# Patient Record
Sex: Male | Born: 1965 | Race: Black or African American | Hispanic: No | Marital: Single | Smoking: Never smoker
Health system: Southern US, Community
[De-identification: ages and names within clinical notes are randomized; demographics above are authoritative.]

## PROBLEM LIST (undated history)

## (undated) DIAGNOSIS — I639 Cerebral infarction, unspecified: Secondary | ICD-10-CM

## (undated) DIAGNOSIS — E785 Hyperlipidemia, unspecified: Secondary | ICD-10-CM

## (undated) DIAGNOSIS — G4730 Sleep apnea, unspecified: Secondary | ICD-10-CM

## (undated) DIAGNOSIS — I10 Essential (primary) hypertension: Secondary | ICD-10-CM

## (undated) DIAGNOSIS — T7840XA Allergy, unspecified, initial encounter: Secondary | ICD-10-CM

## (undated) HISTORY — DX: Allergy, unspecified, initial encounter: T78.40XA

## (undated) HISTORY — DX: Essential (primary) hypertension: I10

## (undated) HISTORY — DX: Cerebral infarction, unspecified: I63.9

## (undated) HISTORY — DX: Hyperlipidemia, unspecified: E78.5

## (undated) HISTORY — DX: Sleep apnea, unspecified: G47.30

---

## 2001-05-23 DIAGNOSIS — I10 Essential (primary) hypertension: Secondary | ICD-10-CM

## 2001-05-23 HISTORY — DX: Essential (primary) hypertension: I10

## 2006-06-22 ENCOUNTER — Ambulatory Visit: Payer: Self-pay | Admitting: Family Medicine

## 2008-12-06 IMAGING — CR DG LUMBAR SPINE AP/LAT/OBLIQUES W/ FLEX AND EXT
1 series · 5 of 5 positions shown · non-contrast
Comparison: none

REASON FOR EXAM: xray l-spine back pain
COMMENTS:

PROCEDURE:     DXR - DXR LUMBAR SPINE WITH OBLIQUES  - June 22, 2006 [DATE]
RESULT:     Diffuse mild degenerative change is noted of the lumbar spine.
There is no evidence of fracture or dislocation.

[Series 1: view not recorded · 0.17mm/px · 5 of 5 slices shown]
[im 1/5]
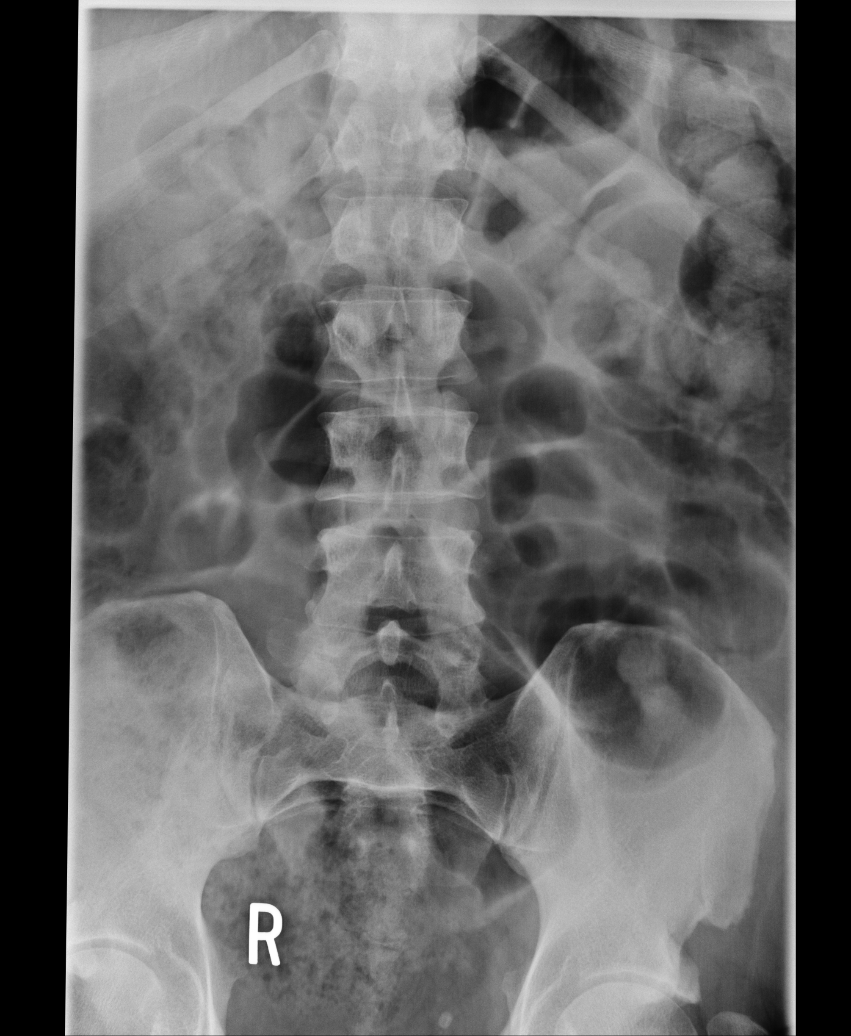
[im 2/5]
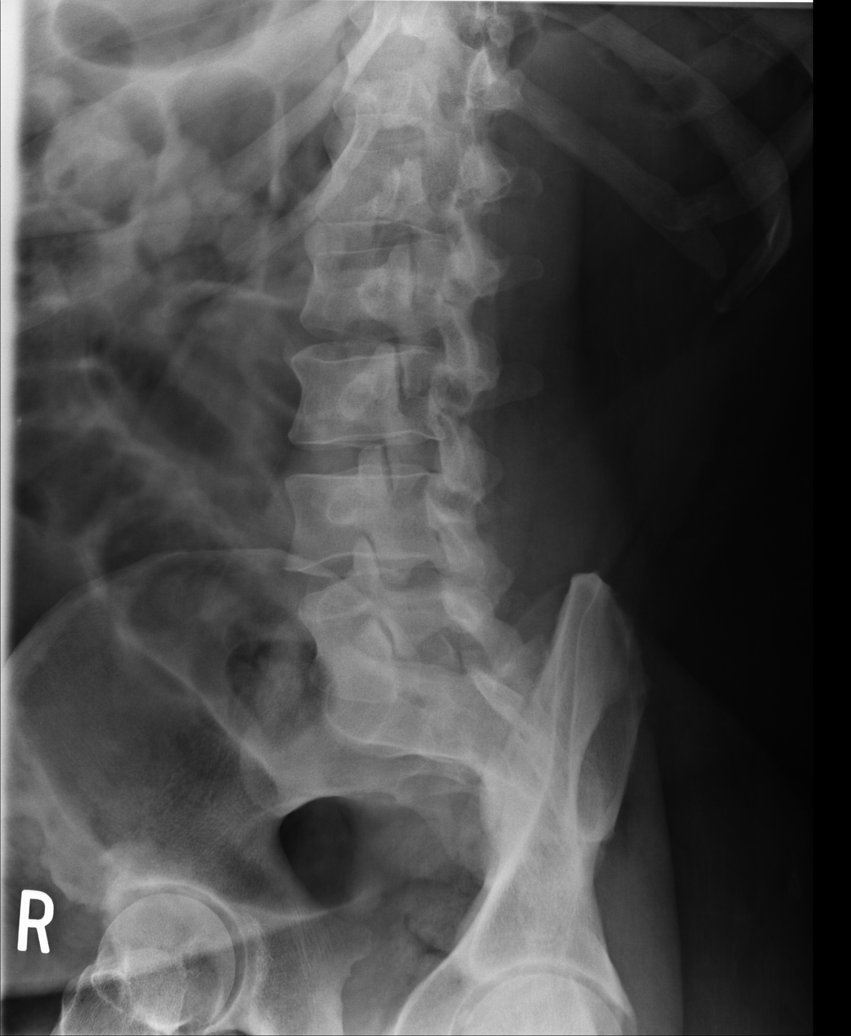
[im 3/5]
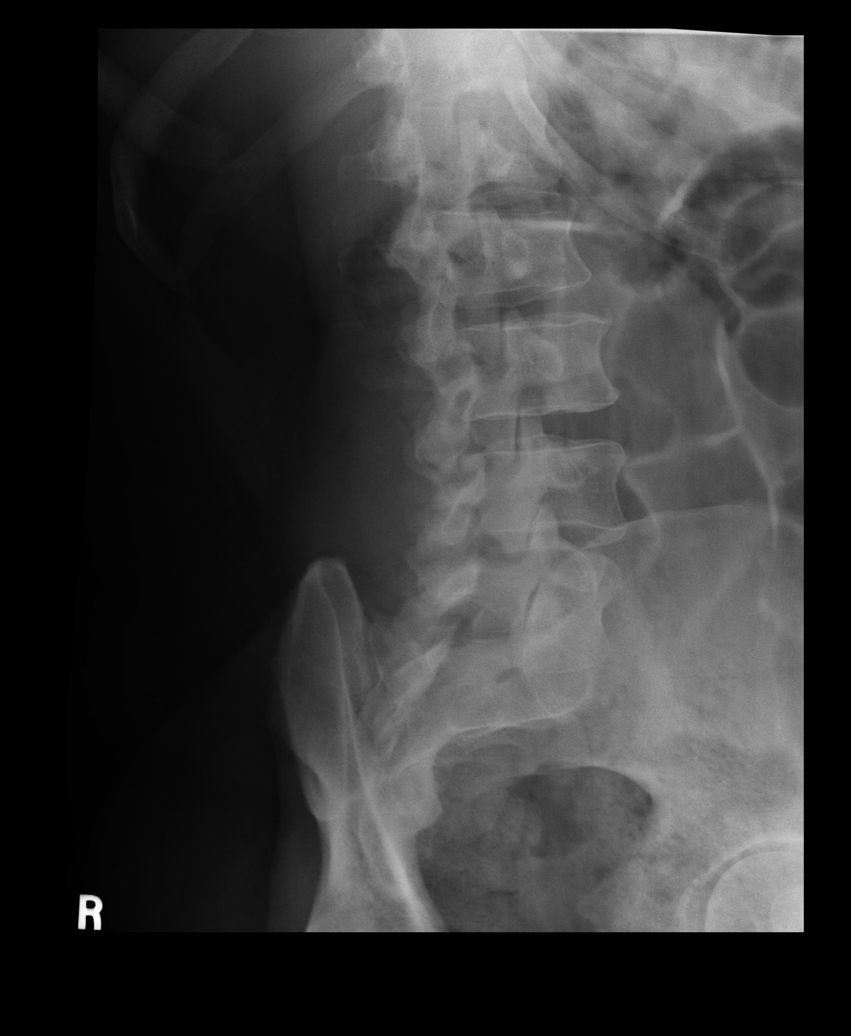
[im 4/5]
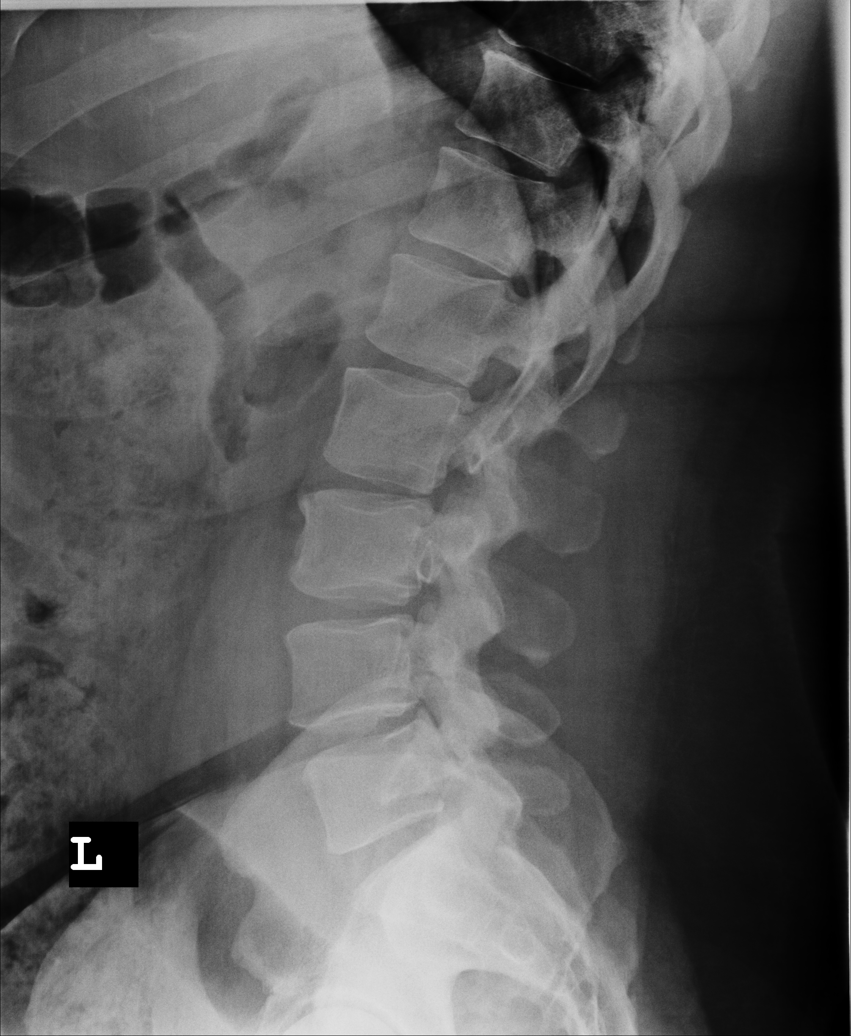
[im 5/5]
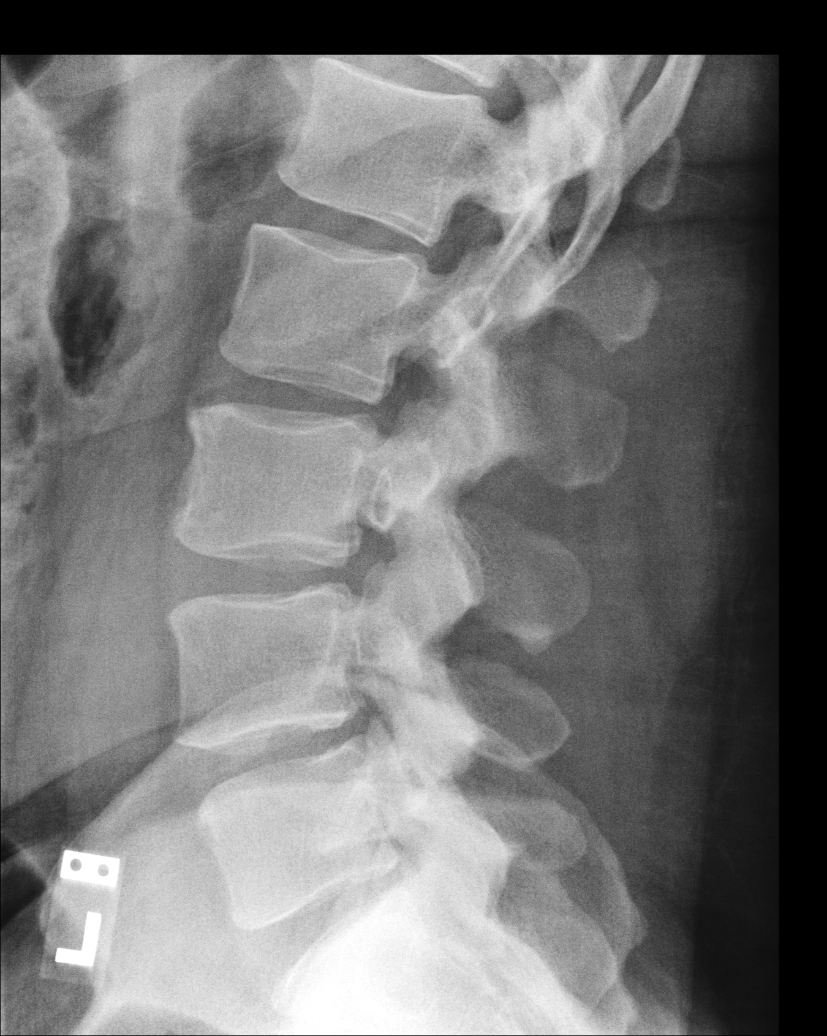

[5 of 5 positions shown; findings below may reference images not displayed]

IMPRESSION: 1)No acute or bony abnormalities are identified.

## 2015-12-22 DIAGNOSIS — I639 Cerebral infarction, unspecified: Secondary | ICD-10-CM

## 2015-12-22 HISTORY — DX: Cerebral infarction, unspecified: I63.9

## 2016-01-22 ENCOUNTER — Ambulatory Visit: Payer: BC Managed Care – PPO | Attending: Physical Medicine and Rehabilitation | Admitting: Occupational Therapy

## 2016-01-22 ENCOUNTER — Encounter: Payer: Self-pay | Admitting: Occupational Therapy

## 2016-01-22 DIAGNOSIS — R278 Other lack of coordination: Secondary | ICD-10-CM | POA: Diagnosis present

## 2016-01-22 DIAGNOSIS — M6281 Muscle weakness (generalized): Secondary | ICD-10-CM | POA: Insufficient documentation

## 2016-01-22 NOTE — Therapy (Signed)
[facility] [address] [place], [place], [address] Phone: [phone]   Fax:  [phone]  Occupational Therapy Treatment  Patient Details  Name: Dalton Mathis MRN: [id number] Date of Birth: January 31, 1966 Referring Provider: Dr. Merrily BrittleKimberly Karrat Rauch  Encounter Date: 01/22/2016      OT End of Session - 01/22/16 1321    Visit Number 1   Number of Visits 24   Date for OT Re-Evaluation 04/15/16   Authorization Type 1 of 10   OT Start Time 1000   OT Stop Time 1100   OT Time Calculation (min) 60 min   Activity Tolerance Patient tolerated treatment well;No increased pain   Behavior During Therapy WFL for tasks assessed/performed      Past Medical History:  Diagnosis Date  . Hypertension   . Stroke [facility](HCC)     History reviewed. No pertinent surgical history.  There were no vitals filed for this visit.      Subjective Assessment - 01/22/16 1255    Subjective  Pt. reports he just returned home from the hospital on Tuesday.   Patient is accompained by: Family member   Pertinent History Pt. is a 50 y.o. male who suffered from a CVA on 12/27/2015. [facility] Pt. returned home on 01/19/2016. Pt. is now appropriate for outpatient services to address residual LUE deficits from the CVA.   Limitations LUE functioning,   Patient Stated Goals To regain use of LUE in order to retrurn to work.   Currently in Pain? No/denies            [facility] - 01/22/16 1013      Assessment   Diagnosis CVA   Referring Provider Dr. Merrily BrittleKimberly Karrat Rauch   Onset Date 12/27/15   Prior Therapy OT, [facility]     Home  Environment   Family/patient expects to be discharged to: Private residence   Living Arrangements Parent   Available Help at Discharge Family   Type of Home House   Home Access Stairs   Entrance Stairs-Rails Can reach both   Entrance Stairs-Number of  Steps 4   Home Layout One level   Bathroom Shower/Tub Tub/Shower unit   Social workerBathroom Toilet Standard   Home Equipment Shower seat   Lives With Family     Prior Function   Level of Independence Independent   Vocation Full time employment   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   Grooming Difficulty holding the electric rayzor.   Upper Body Bathing Independent   Lower Body Bathing Independent  Sits down to perfrom LE bathing, previously stood to perform   Upper Body Dressing Independent   Lower Body Dressing Pt. Has difficulty hiking clothing with the left.   Nurse, learning disabilityToilet Tranfer Independent   Tub/Shower Transfer Independent   Equipment Used Cane   ADL comments Pt. has difficulty using his left hand to perform tasks requiring bilateral hand coordination. including: eating a sandwhich, taking items out of a wallet, typing, perfroming work retaled tasks, using an Systems developerelectric rayzor, and cutting meat.     IADL   Shopping Needs to be accompanied on any shopping trip   Light Housekeeping Does personal laundry completely;Maintains house alone or with occasional assistance   Meal Prep Able to complete simple cold meal  and snack prep   Community Mobility Relies on family or friends for transportation   Medication Management Is responsible for taking medication in correct dosages at correct time   Development worker, community financial matters independently (budgets, writes checks, pays rent, bills goes to bank), collects and keeps track of income     Mobility   Mobility Status Comments --  Carries a cane.     Written Expression   Dominant Hand Right   Handwriting 100% legible     Vision - History   Baseline Vision Wears glasses only for reading     Activity Tolerance   Activity Tolerance Tolerates 10-20 min activity with muiltiple rests     Cognition   Overall  Cognitive Status Within Functional Limits for tasks assessed     Sensation   Light Touch Impaired by gross assessment   Proprioception Appears Intact     Coordination   Right 9 Hole Peg Test 24 sec.   Left 9 Hole Peg Test 1 min.     AROM   Overall AROM Comments [facility]     Strength   Strength Assessment Site Shoulder   Right/Left Shoulder Left   Left Shoulder Flexion 4/5   Left Shoulder ABduction 4/5   Right/Left Elbow Left   Left Elbow Flexion 5/5   Left Elbow Extension 5/5   Right/Left Forearm Left   Left Forearm Pronation 4+/5   Left Forearm Supination 4+/5   Right/Left Wrist Left   Left Wrist Flexion 4+/5   Left Wrist Extension 4+/5   Left Wrist Radial Deviation 4+/5   Left Wrist Ulnar Deviation 4+/5     Hand Function   Right Hand Grip (lbs) 78   Right Hand Lateral Pinch 29 lbs   Right Hand 3 Point Pinch 15 lbs   Left Hand Grip (lbs) 52   Left Hand Lateral Pinch 24 lbs   Left 3 point pinch 16 lbs     Sensation Exercises   Stereognosis --  3/5 accuracy                          OT Education - 01/22/16 1321    Education provided Yes   Education Details OT services, POC, goals, LUE function, and stroke recovery.   Person(s) Educated Patient   Methods Explanation   Comprehension Verbalized understanding             OT Long Term Goals - 01/22/16 1334      OT LONG TERM GOAL #1   Title Pt. will increase left grip strength by 10# to be able to hold and use an electri rayzor.   Baseline Left: 52#, Right 78#   Time 12   Period Weeks   Status New     OT LONG TERM GOAL #2   Title Pt. will improve left hand coordination skills by 6 sec. to be able to remove items from his wallet efficiently.   Baseline Left: 1 min., R: 24 sec.   Time 12   Period Weeks   Status New     OT LONG TERM GOAL #3   Title Pt. will improve Left UE strength by 2 muscle grades in preparation to perform work related tasks.   Baseline LUE weakness   Time 12   Period  Weeks   Status New     OT LONG TERM GOAL #4   Title Pt. will accurately pick up and place iitems of varying weight 100%  of the time with the left hand in preparation for household tasks.   Baseline Pt. has difficulty   Time 12   Period Weeks   Status New     OT LONG TERM GOAL #5   Title Pt. will improve Left hand awareness to be able to hike clothing independently.   Baseline Pt. has difficulty.   Time 12   Period Weeks   Status New               Plan - 01/22/16 1323    Clinical Impression Statement Pt. is a 50 y.o. male who suffered a CVA, and presents with residual left sided weakness, incoordination, and sensory impairments. Pt. has difficulty accommodating the varying weights of items with his left hand. These impairments hinder his ability to complete basic ADL tasks,, IADL tasks, home management, and work related tasks.  Pt. Could benefit from skilled OT services to work on improving Left UE strength, grip strength, coordination, stereogniosis, and left sided sensory awareness, to be able to hold and use an electric rayzor, typing, retrieving iitems from a wallet, and perfroming work related tasks.   Rehab Potential Good   OT Frequency 2x / week   OT Duration 12 weeks   OT Treatment/Interventions Self-care/ADL training;Therapeutic exercise;Functional Mobility Training;Patient/family education;Neuromuscular education;Energy conservation;Therapeutic exercises;Therapeutic activities;DME and/or AE instruction;Moist Heat   Plan Plan to work on improving left hand function   Consulted and Agree with Plan of Care Patient      Patient will benefit from skilled therapeutic intervention in order to improve the following deficits and impairments:  Decreased strength, Decreased activity tolerance, Decreased coordination, Impaired UE functional use, Pain, Impaired perceived functional ability, Decreased mobility, Decreased knowledge of use of DME, Decreased endurance, Impaired  sensation  Visit Diagnosis: Muscle weakness (generalized)  Other lack of coordination    Problem List There are no active problems to display for this patient.   [name], MS, OTR/L 01/22/2016, 1:50 PM  [facility] MAIN [facility] SERVICES [address] [place], [place], [address] Phone: [phone]   Fax:  [phone]  Name: Jacobe Study MRN: [id number] Date of Birth: [birth year]

## 2016-01-28 ENCOUNTER — Ambulatory Visit: Payer: BC Managed Care – PPO | Admitting: Physical Therapy

## 2016-01-29 ENCOUNTER — Ambulatory Visit: Payer: BC Managed Care – PPO | Admitting: Occupational Therapy

## 2016-01-29 DIAGNOSIS — M6281 Muscle weakness (generalized): Secondary | ICD-10-CM | POA: Diagnosis not present

## 2016-01-29 DIAGNOSIS — R278 Other lack of coordination: Secondary | ICD-10-CM

## 2016-01-29 NOTE — Patient Instructions (Signed)
OT TREATMENT    Therapeutic Exercise:  Pt. Worked on the [employer] Corporation[employer] for 8 min. With constant monitoring of the BUEs. Pt. Worked on changing, and alternating forward reverse position every 2 min. Rest breaks were required. Pt. Required readjustment of his left hand on the handle. Pt. Worked on pinch strengthening in the left hand for lateral, and 3pt. pinch using yellow, red, green, blue, and black resistive clips. Pt. worked on placing the clips at various vertical and horizontal angles. Tactile and verbal cues were required for eliciting the desired movement. Pt. performed gross gripping with grip strengthener. Pt. worked on sustaining grip while grasping pegs and reaching at various heights. Gripper was placed in the 3rd resistive slot with the white resistive spring. Pt. Performed hand strengthening with green theraputty. Pt. required cues for proper technique. Pt. worked on gross grip loop, lateral pinch, 3pt. pinch, gross digit extension, digit extension table spread, digit abduction loop, single digit extension loop, thumb opposition, and lumbical ex,  Pt. required verbal and tactile cues for proper technique.  Selfcare:  Pt. Completed a a 5 min. Typing test. Pt. Completed a typing speed of 6  Wpm.

## 2016-01-29 NOTE — Therapy (Signed)
[place] [facility] [address] [place], [place], [address] Phone: [phone]   Fax:  [phone]  Occupational Therapy Treatment  Patient Details  Name: Dalton CarlLawrence Mathis MRN: [id number] Date of Birth: [birth year] Referring Provider: Dr. Merrily BrittleKimberly Karrat Rauch  Encounter Date: 01/29/2016      OT End of Session - 01/29/16 1239    Visit Number 2   Number of Visits 24   Date for OT Re-Evaluation 04/15/16   Authorization Type 2 of 10   OT Start Time 1100   OT Stop Time 1149   OT Time Calculation (min) 49 min   Activity Tolerance Patient tolerated treatment well   Behavior During Therapy [facility] for tasks assessed/performed      Past Medical History:  Diagnosis Date  . Hypertension   . Stroke [facility](HCC)     No past surgical history on file.  There were no vitals filed for this visit.      Subjective Assessment - 01/29/16 1202    Subjective  Pt. reports trying to go back to the gym.   Patient is accompained by: Family member   Pertinent History Pt. is a 50 y.o. male who suffered from a CVA on 12/27/2015. [facility]. Pt. returned home on 01/19/2016. Pt. is now appropriate for outpatient services to address residual LUE deficits from the CVA.   Patient Stated Goals To regain use of LUE in order to retrurn to work.   Currently in Pain? No/denies  Pt. has discomfort in left hip, not rated.   Multiple Pain Sites No      OT TREATMENT    Therapeutic Exercise:  Pt. Worked on the [employer] for 8 min. With constant monitoring of the BUEs. Pt. Worked on changing, and alternating forward reverse position every 2 min. Rest breaks were required. Pt. Required readjustment of his left hand on the handle. Pt. Worked on pinch strengthening in the left hand for lateral, and 3pt. pinch using yellow, red, green, blue, and black resistive clips. Pt. worked on placing the clips at various vertical and horizontal  angles. Tactile and verbal cues were required for eliciting the desired movement. Pt. performed gross gripping with grip strengthener. Pt. worked on sustaining grip while grasping pegs and reaching at various heights. Gripper was placed in the 3rd resistive slot with the white resistive spring. Pt. Performed hand strengthening with green theraputty. Pt. required cues for proper technique. Pt. worked on gross grip loop, lateral pinch, 3pt. pinch, gross digit extension, digit extension table spread, digit abduction loop, single digit extension loop, thumb opposition, and lumbical ex. Pt. required verbal and tactile cues for proper technique.  Selfcare:  Pt. Completed a a 5 min. Typing test. Pt. Completed a typing speed of 6  Wpm.                              OT Education - 01/29/16 1204    Education provided Yes   Education Details Pt. education about LUE functioning.   Person(s) Educated Patient   Methods Explanation   Comprehension Verbalized understanding             OT Long Term Goals - 01/22/16 1334      OT LONG TERM GOAL #1   Title Pt. will increase left grip strength by 10# to be able to hold and use an electri rayzor.   Baseline Left: 52#, Right  78#   Time 12   Period Weeks   Status New     OT LONG TERM GOAL #2   Title Pt. will improve left hand coordination skills by 6 sec. to be able to remove items from his wallet efficiently.   Baseline Left: 1 min., R: 24 sec.   Time 12   Period Weeks   Status New     OT LONG TERM GOAL #3   Title Pt. will improve Left UE strength by 2 muscle grades in preparation to perform work related tasks.   Baseline LUE weakness   Time 12   Period Weeks   Status New     OT LONG TERM GOAL #4   Title Pt. will accurately pick up and place iitems of varying weight 100% of the time with the left hand in preparation for household tasks.   Baseline Pt. has difficulty   Time 12   Period Weeks   Status New     OT LONG  TERM GOAL #5   Title Pt. will improve Left hand awareness to be able to hike clothing independently.   Baseline Pt. has difficulty.   Time 12   Period Weeks   Status New               Plan - 01/29/16 1239    Clinical Impression Statement Pt. continues to require work on improving LUE strength, grip, strength, pinch strength, and coordination skills. Pt. has difficulty accommodating the left hand to various weights of objects. Pt. could benefit from skilled OT services to improve UE functioning during ADL and IADL tasks including typing, lifting and holding objects.   Rehab Potential Good   OT Frequency 2x / week   OT Duration 12 weeks   OT Treatment/Interventions Self-care/ADL training;Therapeutic exercise;Functional Mobility Training;Patient/family education;Neuromuscular education;Energy conservation;Therapeutic exercises;Therapeutic activities;DME and/or AE instruction;Moist Heat   Consulted and Agree with Plan of Care Patient      Patient will benefit from skilled therapeutic intervention in order to improve the following deficits and impairments:  Decreased strength, Decreased activity tolerance, Decreased coordination, Impaired UE functional use, Pain, Impaired perceived functional ability, Decreased mobility, Decreased knowledge of use of DME, Decreased endurance, Impaired sensation  Visit Diagnosis: Muscle weakness (generalized)  Other lack of coordination    Problem List There are no active problems to display for this patient.   [name], MS, OTR/L 01/29/2016, 12:52 PM  [facility] MAIN [facility] SERVICES [address] [place], [place], [address] Phone: [phone]   Fax:  [phone]  Name: Dalton Mathis MRN: [id number] Date of Birth: 22-Oct-1965

## 2016-02-01 ENCOUNTER — Encounter: Payer: Self-pay | Admitting: Physical Therapy

## 2016-02-01 ENCOUNTER — Ambulatory Visit: Payer: BC Managed Care – PPO | Admitting: Physical Therapy

## 2016-02-01 ENCOUNTER — Ambulatory Visit: Payer: BC Managed Care – PPO | Admitting: Occupational Therapy

## 2016-02-01 DIAGNOSIS — M6281 Muscle weakness (generalized): Secondary | ICD-10-CM | POA: Diagnosis not present

## 2016-02-01 DIAGNOSIS — R278 Other lack of coordination: Secondary | ICD-10-CM

## 2016-02-01 NOTE — Therapy (Signed)
[place] [facility] MAIN [facility] SERVICES [address] [place], [place], [address] Phone: [phone]   Fax:  [phone]  Physical Therapy Evaluation  Patient Details  Name: Dalton Mathis MRN: [id number] Date of Birth: 01-06-1966 Referring Provider: Merrily Brittle  Encounter Date: 02/01/2016      PT End of Session - 02/01/16 1358    Visit Number 1   Number of Visits 25   Date for PT Re-Evaluation 04/25/16   PT Start Time 0145   PT Stop Time 0245   PT Time Calculation (min) 60 min   Equipment Utilized During Treatment Gait belt   Activity Tolerance Patient tolerated treatment well   Behavior During Therapy [facility] for tasks assessed/performed      Past Medical History:  Diagnosis Date  . Hypertension   . Stroke [facility])     History reviewed. No pertinent surgical history.  There were no vitals filed for this visit.       Subjective Assessment - 02/01/16 1350    Subjective Patient report having decreased strength in his LUE and numbness in LLE.    How long can you stand comfortably? 1/2 hour same as before the cva   How long can you walk comfortably? 30 mintues   Patient Stated Goals for his left leg to not be numb   Currently in Pain? No/denies            [facility] PT Assessment - 02/01/16 0001      Assessment   Medical Diagnosis cva   Referring Provider [place], [place] KARRAT   Onset Date/Surgical Date 12/27/15   Hand Dominance Right   Next MD Visit 02/23/16   Prior Therapy hospital      Precautions   Precautions Fall     Restrictions   Weight Bearing Restrictions No     Balance Screen   Has the patient fallen in the past 6 months Yes   Has the patient had a decrease in activity level because of a fear of falling?  Yes   Is the patient reluctant to leave their home because of a fear of falling?  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   Leisure Reading, writing, sports, wrestle     Cognition   Overall Cognitive Status Within Functional Limits for tasks assessed   Attention Focused      PAIN: 0/10  POSTURE: WNL   PROM/AROM: WFL  STRENGTH:  Graded on a 0-5 scale Muscle Group Left Right  Shoulder flex 5/5 5/5  Shoulder Abd 5/5 5/5  Shoulder Ext 5/5 5/5  Shoulder IR/ER 5/5 5/5  Elbow 4/5 5/5  Wrist/hand 5/5 5/5  Hip Flex 3/5 5/5  Hip Abd 4/5 5/5  Hip Add 2/5 4/5  Hip Ext 4/5 4/5  Hip IR/ER 5/5 5/5  Knee Flex 5/5 5/5  Knee Ext 5/5 5/5  Ankle DF 3/5 5/5  Ankle PF 3/5 5/5   SENSATION: LLE numbness    FUNCTIONAL MOBILITY: independent   BALANCE: unable to single leg stand, modified tandem stand  x 30 sec bilaterally   GAIT: ambulates with spc  OUTCOME MEASURES: TEST Outcome Interpretation  5 times sit<>stand 14.78 sec >60 yo, >15 sec indicates increased risk for falls  10 meter walk test             1.24 m/s <1.0 m/s indicates increased risk for falls; limited community ambulator  Timed up and Go   13.50              sec <14 sec indicates increased risk for falls  6 minute walk test 1200               Feet 1000 feet is community ambulator                                  PT Education - 02/01/16 1357    Education provided Yes   Education Details plan of care   Person(s) Educated Patient   Methods Explanation   Comprehension Verbalized understanding             PT Long Term Goals - 02/01/16 1412      PT LONG TERM GOAL #1   Title Patient will increase BLE gross strength to 4+/5 as to improve functional strength for independent gait, increased standing tolerance and  increased ADL ability.   Time 4   Period Weeks   Status New     PT LONG TERM GOAL #2   Title Patient will reduce timed up and go to <11 seconds to reduce fall risk and demonstrate improved transfer/gait ability.   Time 4   Period Weeks   Status New     PT LONG TERM GOAL #3   Title Patient will be independent with ascend/descend 12 steps using single UE in step over step pattern without LOB.   Time 4   Period Weeks   Status New     PT LONG TERM GOAL #4   Title Patient will increase six minute walk test distance to >1200 for progression to community ambulator and improve gait ability   Time 4   Period Weeks   Status New               Plan - 02/01/16 1416    Clinical Impression Statement Patient is 50 yr old male who presents to out patient clinic after a cva. He has weakness in LLE and LUE and numbness in LLE. He has increased falls risk evidenced wiht outcome measures. He has decreased dynamic standing balance . He ambulates with spc  community distances.    Rehab Potential Good   PT Frequency 2x / week   PT Duration 12 weeks   PT Treatment/Interventions Therapeutic exercise;Balance training;Neuromuscular re-education;Therapeutic activities;Gait training;Manual techniques   PT Next Visit Plan strengthening and balance    PT Home Exercise Plan heel raises, marching in place   Consulted and Agree with Plan of Care Patient      Patient will benefit from skilled therapeutic intervention in order to improve the following deficits and impairments:  Decreased balance, Impaired sensation, Obesity, Decreased activity tolerance, Decreased coordination, Decreased strength  Visit Diagnosis: Muscle weakness (generalized) - Plan: PT plan of care cert/re-cert  Other lack of coordination - Plan: PT plan of care cert/re-cert     Problem List There are no active problems to display for this patient.  Ezekiel InaKristine S Madysun Thall, PT, DPT [place], Barkley BrunsKristine S 02/01/2016, 3:45  PM  [facility] MAIN [facility] SERVICES [address] [place], [place], [address] Phone: [phone]   Fax:  [phone]  Name: Dalton Mathis MRN: [id number] Date of Birth: [birth year]

## 2016-02-01 NOTE — Patient Instructions (Signed)
Heel raises 20 x 2 BTB x ankle eversion x 20 x 2 BTB x ankle DF x 20 x 2

## 2016-02-01 NOTE — Therapy (Addendum)
[facility] MAIN [facility] SERVICES [address] [place], [place], [address] Phone: [phone]   Fax:  [phone]  Occupational Therapy Treatment  Patient Details  Name: Dalton Mathis MRN: [id number] Date of Birth: Nov 05, 1965 Referring Provider: Dr. Merrily Brittle  Encounter Date: 02/01/2016      OT End of Session - 02/01/16 1157    Visit Number 3   Number of Visits 24   Date for OT Re-Evaluation 04/15/16   Authorization Type 3 of 10   OT Start Time 1110   OT Stop Time 1150   OT Time Calculation (min) 40 min   Activity Tolerance Patient tolerated treatment well   Behavior During Therapy [facility] for tasks assessed/performed      Past Medical History:  Diagnosis Date  . Hypertension   . Stroke [facility])     No past surgical history on file.  There were no vitals filed for this visit.      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   Patient is accompained by: Family member   Pertinent History Pt. is a 50 y.o. male who suffered from a CVA on 12/27/2015. Pt. received OT/PT services at [facility] inpatient rehab. Pt. returned home on 01/19/2016. Pt. is now appropriate for outpatient services to address residual LUE deficits from the CVA.   Limitations LUE functioning,   Patient Stated Goals To regain use of LUE in order to retrurn to work.   Currently in Pain? No/denies   Multiple Pain Sites No      OT TREATMENT    Neuro muscular re-education:  Pt. Worked on the development of fine motor coordination activities to improve LUE accommodation to objects of varying weights. Pt. Has difficulty with over grasping light objects. Pt. performed [facility] skills training to improve speed and dexterity needed for ADL tasks and writing. Pt. demonstrated grasping 1 inch sticks, and  inch cylindrical collars, on the Purdue pegboard. Pt. performed grasping each item with her 2nd digit and thumb,  and storing them in the palm. Pt. presented with difficulty storing  inch objects at a time in the palmar aspect of the hand. Pt. Worked on alternating hand movements, and bilateral hand coordination skills.  Therapeutic Exercise:   Pt. Worked on the [employer] for 8 min. With constant monitoring of the BUEs. Pt. Worked on changing, and alternating forward reverse position every 2 min. Rest breaks were required. Pt. Was assisted with hand placement 2 times during session.                             OT Education - 02/01/16 1156    Education provided Yes   Education Details Pt. education was provided about UE functioning.   Person(s) Educated Patient   Methods Explanation   Comprehension Verbalized understanding             OT Long Term Goals - 01/22/16 1334      OT LONG TERM GOAL #1   Title Pt. will increase left grip strength by 10# to be able to hold and use an electri rayzor.   Baseline Left: 52#, Right 78#   Time 12   Period Weeks   Status New     OT LONG TERM GOAL #2   Title Pt. will improve left hand coordination skills by 6 sec. to be able to remove items from his  wallet efficiently.   Baseline Left: 1 min., R: 24 sec.   Time 12   Period Weeks   Status New     OT LONG TERM GOAL #3   Title Pt. will improve Left UE strength by 2 muscle grades in preparation to perform work related tasks.   Baseline LUE weakness   Time 12   Period Weeks   Status New     OT LONG TERM GOAL #4   Title Pt. will accurately pick up and place iitems of varying weight 100% of the time with the left hand in preparation for household tasks.   Baseline Pt. has difficulty   Time 12   Period Weeks   Status New     OT LONG TERM GOAL #5   Title Pt. will improve Left hand awareness to be able to hike clothing independently.   Baseline Pt. has difficulty.   Time 12   Period Weeks   Status New               Plan - 02/01/16 1158    Clinical Impression Statement  Pt. is improving with UE strength. Pt. continues to require work on improving [facility] skills for grasping various sized objects. Pt. continues to have difficulty accommodating the LUE to changes in the weight objects.    Rehab Potential Good   OT Frequency 2x / week   OT Duration 12 weeks   OT Treatment/Interventions Self-care/ADL training;Therapeutic exercise;Functional Mobility Training;Patient/family education;Neuromuscular education;Energy conservation;Therapeutic exercises;Therapeutic activities;DME and/or AE instruction;Moist Heat   Consulted and Agree with Plan of Care Patient      Patient will benefit from skilled therapeutic intervention in order to improve the following deficits and impairments:  Decreased strength, Decreased activity tolerance, Decreased coordination, Impaired UE functional use, Pain, Impaired perceived functional ability, Decreased mobility, Decreased knowledge of use of DME, Decreased endurance, Impaired sensation  Visit Diagnosis: Muscle weakness (generalized)  Other lack of coordination    Problem List There are no active problems to display for this patient.   Olegario MessierElaine Rhoderick Farrel, MS, OTR/L 02/01/2016, 12:10 PM  [place] [address] [place], [place], [address] Phone: [phone]   Fax:  [phone]  Name: Malachi CarlLawrence Mattos MRN: [id number] Date of Birth: [birth year]

## 2016-02-01 NOTE — Patient Instructions (Signed)
OT TREATMENT    Neuro muscular re-education:  Pt. Worked on the development of fine motor coordination activities to improve LUE accommodation to objects of varying weights. Pt. Has difficulty with over grasping light objects. Pt. performed [facility] skills training to improve speed and dexterity needed for ADL tasks and writing. Pt. demonstrated grasping 1 inch sticks, and  inch cylindrical collars, on the Purdue pegboard. Pt. performed grasping each item with her 2nd digit and thumb, and storing them in the palm. Pt. presented with difficulty storing  inch objects at a time in the palmar aspect of the hand. Pt. Worked on alternating hand movements, and bilateral hand coordination skills.  Therapeutic Exercise:   Pt. Worked on the [employer] for 8 min. With constant monitoring of the BUEs. Pt. Worked on changing, and alternating forward reverse position every 2 min. Rest breaks were required. Pt. Was assisted with hand placement 2 times during session.

## 2016-02-03 ENCOUNTER — Ambulatory Visit: Payer: BC Managed Care – PPO | Admitting: Physical Therapy

## 2016-02-05 ENCOUNTER — Ambulatory Visit: Payer: BC Managed Care – PPO | Admitting: Occupational Therapy

## 2016-02-05 DIAGNOSIS — R278 Other lack of coordination: Secondary | ICD-10-CM

## 2016-02-05 DIAGNOSIS — M6281 Muscle weakness (generalized): Secondary | ICD-10-CM

## 2016-02-05 NOTE — Therapy (Signed)
[facility] [address] [place], [place], [address] Phone: [phone]   Fax:  [phone]  Occupational Therapy Treatment  Patient Details  Name: Dalton Mathis MRN: [id number] Date of Birth: October 31, 1965 Referring Provider: Dr. Merrily BrittleKimberly Karrat Rauch  Encounter Date: 02/05/2016      OT End of Session - 02/05/16 1310    Visit Number 4   Number of Visits 24   Date for OT Re-Evaluation 04/15/16   Authorization Type 4 of 10   OT Start Time 1000   OT Stop Time 1045   OT Time Calculation (min) 45 min   Activity Tolerance Patient tolerated treatment well   Behavior During Therapy [facility] for tasks assessed/performed      Past Medical History:  Diagnosis Date  . Hypertension   . Stroke [facility])     No past surgical history on file.  There were no vitals filed for this visit.      Subjective Assessment - 02/05/16 1309    Subjective  Pt. reports going to the gym more. Pt. is now walking without the cane.   Patient is accompained by: Family member   Pertinent History Pt. is a 50 y.o. male who suffered from a CVA on 12/27/2015. [facility]. Pt. returned home on 01/19/2016. Pt. is now appropriate for outpatient services to address residual LUE deficits from the CVA.   Limitations LUE functioning,   Patient Stated Goals To regain use of LUE in order to retrurn to work.   Currently in Pain? No/denies      OT TREATMENT    Neuro muscular re-education:  Pt. performed [facility] tasks using the Grooved pegboard. Pt. worked on grasping the grooved pegs from a horizontal position, and moving the pegs to a vertical position in the hand to prepare for placing them in the grooved slot. Pt. Worked on storing the pegs. Pt. Worked on grasping coins from a tabletop surface, placing them into a resistive container, and pushing them through the slot while isolating his 2nd digit. Pt. Worked on grasping them  without looking to identify the quarters by feel. Pt. Also worked on [facility] skills with grasping, storing items in the palm, and translatory movements. Pt. Worked on identifying, and matching multiple textures of cloth with vision occluded. Pt. Had difficulty distinguishing similar textures.  Therapeutic Exercise:  Pt. Worked on the [employer] for 8 min. With constant monitoring of the BUEs. Pt. Worked on changing, and alternating forward reverse position every 2 min. Rest breaks were required. Cues for Left hand placement were required.                           OT Education - 02/05/16 1310    Education provided Yes   Person(s) Educated Patient   Methods Explanation   Comprehension Verbalized understanding             OT Long Term Goals - 01/22/16 1334      OT LONG TERM GOAL #1   Title Pt. will increase left grip strength by 10# to be able to hold and use an electri rayzor.   Baseline Left: 52#, Right 78#   Time 12   Period Weeks   Status New     OT LONG TERM GOAL #2   Title Pt. will improve left hand coordination skills by 6 sec. to be able to remove items from his wallet  efficiently.   Baseline Left: 1 min., R: 24 sec.   Time 12   Period Weeks   Status New     OT LONG TERM GOAL #3   Title Pt. will improve Left UE strength by 2 muscle grades in preparation to perform work related tasks.   Baseline LUE weakness   Time 12   Period Weeks   Status New     OT LONG TERM GOAL #4   Title Pt. will accurately pick up and place iitems of varying weight 100% of the time with the left hand in preparation for household tasks.   Baseline Pt. has difficulty   Time 12   Period Weeks   Status New     OT LONG TERM GOAL #5   Title Pt. will improve Left hand awareness to be able to hike clothing independently.   Baseline Pt. has difficulty.   Time 12   Period Weeks   Status New               Plan - 02/05/16 1311    Clinical Impression  Statement Pt. is making progress overall with LUE functioning. Pt. continues to require work on improving strength, coordination skills for use during ADL and IADL tasks. Pt. continues to have difficulty distinguishing between various textures of cloth, and similair objects without looking. Pt. had difficulty distinguishing quarters and nickels, however did well with distinquishing quarters from pennies, and dimes.   Rehab Potential Good   OT Frequency 2x / week   OT Duration 12 weeks   OT Treatment/Interventions Self-care/ADL training;Therapeutic exercise;Functional Mobility Training;Patient/family education;Neuromuscular education;Energy conservation;Therapeutic exercises;Therapeutic activities;DME and/or AE instruction;Moist Heat   Consulted and Agree with Plan of Care Patient      Patient will benefit from skilled therapeutic intervention in order to improve the following deficits and impairments:     Visit Diagnosis: Muscle weakness (generalized)  Other lack of coordination    Problem List There are no active problems to display for this patient.   [name], MS, OTR/L 02/05/2016, 1:19 PM  [facility] MAIN [facility] SERVICES [address] [place], [place], [address] Phone: [phone]   Fax:  [phone]  Name: Dalton Mathis MRN: [id number] Date of Birth: [birth year]

## 2016-02-05 NOTE — Patient Instructions (Signed)
OT TREATMENT    Neuro muscular re-education:  Pt. performed [facility] tasks using the Grooved pegboard. Pt. worked on grasping the grooved pegs from a horizontal position, and moving the pegs to a vertical position in the hand to prepare for placing them in the grooved slot. Pt. Worked on storing the pegs. Pt. Worked on grasping coins from a tabletop surface, placing them into a resistive container, and pushing them through the slot while isolating his 2nd digit. Pt. Worked on grasping them without looking to identify the quarters by feel. Pt. Worked on identifying, and matching multiple textures of cloth with vision occluded.   Therapeutic Exercise:  Pt. Worked on the [employer] for 8 min. With constant monitoring of the BUEs. Pt. Worked on changing, and alternating forward reverse position every 2 min. Rest breaks were required. Cues for Left hand placement were required.

## 2016-02-08 ENCOUNTER — Encounter: Payer: Self-pay | Admitting: Physical Therapy

## 2016-02-08 ENCOUNTER — Ambulatory Visit: Payer: BC Managed Care – PPO | Admitting: Occupational Therapy

## 2016-02-08 ENCOUNTER — Ambulatory Visit: Payer: BC Managed Care – PPO | Admitting: Physical Therapy

## 2016-02-08 DIAGNOSIS — M6281 Muscle weakness (generalized): Secondary | ICD-10-CM | POA: Diagnosis not present

## 2016-02-08 DIAGNOSIS — R278 Other lack of coordination: Secondary | ICD-10-CM

## 2016-02-08 NOTE — Therapy (Signed)
[facility] MAIN [facility] SERVICES [address] [place], [place], [address] Phone: [phone]   Fax:  [phone]  Physical Therapy Treatment  Patient Details  Name: Nicolai Labonte MRN: [id number] Date of Birth: 1966/04/17 Referring Provider: Merrily Brittle  Encounter Date: 02/08/2016      PT End of Session - 02/08/16 1432    Visit Number 2   Number of Visits 25   Date for PT Re-Evaluation 04/25/16   PT Start Time 0230   PT Stop Time 0315   PT Time Calculation (min) 45 min   Equipment Utilized During Treatment Gait belt   Activity Tolerance Patient tolerated treatment well   Behavior During Therapy [facility] for tasks assessed/performed      Past Medical History:  Diagnosis Date  . Hypertension   . Stroke [facility])     History reviewed. No pertinent surgical history.  There were no vitals filed for this visit.      Subjective Assessment - 02/08/16 1431    Subjective Patient report having sorness in left hip.    How long can you stand comfortably? 1/2 hour same as before the cva   How long can you walk comfortably? 30 mintues   Patient Stated Goals for his left leg to not be numb   Currently in Pain? No/denies   Multiple Pain Sites No     Therapeutic exercise and NMR:  Tm walking with elevation of 2 and 1. 5 miles / hour Tm side stepping at .5 miles / hour left and right  Side stepping in parallel bars on blue foam 10 x 2 Leg press 130 lbs x 20 x 3, heel raises 20 x 3 Wall squats with hold of 10 x 10 Cross trainer octaine x 10 mins level 10 Patient needs occasional verbal cueing to improve posture and cueing to correctly perform exercises slowly, holding at end of range to increase motor firing of desired muscle to encourage fatigue.                            PT Education - 02/08/16 1431    Education provided Yes   Education Details HEP    Person(s) Educated Patient   Methods Explanation    Comprehension Verbalized understanding             PT Long Term Goals - 02/01/16 1412      PT LONG TERM GOAL #1   Title Patient will increase BLE gross strength to 4+/5 as to improve functional strength for independent gait, increased standing tolerance and increased ADL ability.   Time 4   Period Weeks   Status New     PT LONG TERM GOAL #2   Title Patient will reduce timed up and go to <11 seconds to reduce fall risk and demonstrate improved transfer/gait ability.   Time 4   Period Weeks   Status New     PT LONG TERM GOAL #3   Title Patient will be independent with ascend/descend 12 steps using single UE in step over step pattern without LOB.   Time 4   Period Weeks   Status New     PT LONG TERM GOAL #4   Title Patient will increase six minute walk test distance to >1200 for progression to community ambulator and improve gait ability   Time 4   Period Weeks   Status New  Plan - 02/08/16 1432    Clinical Impression Statement Patient has deceased strength and decreased  dynamic standing balance and was able to perform activities to challenge his dynamic standing balnace. Cueing was needed during exercises for posture correction and intensity.    Rehab Potential Good   PT Frequency 2x / week   PT Duration 12 weeks   PT Treatment/Interventions Therapeutic exercise;Balance training;Neuromuscular re-education;Therapeutic activities;Gait training;Manual techniques   PT Next Visit Plan strengthening and balance    PT Home Exercise Plan heel raises, marching in place   Consulted and Agree with Plan of Care Patient      Patient will benefit from skilled therapeutic intervention in order to improve the following deficits and impairments:  Decreased balance, Impaired sensation, Obesity, Decreased activity tolerance, Decreased coordination, Decreased strength  Visit Diagnosis: Muscle weakness (generalized)  Other lack of coordination     Problem  List There are no active problems to display for this patient.  Ezekiel InaKristine S Nikiyah Fackler, PT, DPT [place], [place] 02/08/2016, 2:35 PM  [place] [facility] [address] [place], [place], [address] Phone: [phone]   Fax:  [phone]  Name: Malachi CarlLawrence Huitron MRN: [id number] Date of Birth: 1965/10/10

## 2016-02-08 NOTE — Patient Instructions (Signed)
OT TREATMENT    Neuro muscular re-education:  Pt.  Focused on improving [facility] with manipulating nuts and bolts positioned vertically. Pt. Was able to unscrew both the 1" and 1/2" nuts, however was unable to remove the nuts without dropping them. Pt. Worked on picking up the nuts from the table and placing them back onto the bolt. Pt. Had more difficulty grasping and placing the nuts into position and alignment. Pt. required increased time and cues for hand control during the task.  Therapeutic Exercise:  Pt. Worked on the [employer] for 8 min. With constant monitoring of the BUEs. Pt. Worked on changing, and alternating forward reverse position every 2 min. Rest breaks were required. Pt. performed gross gripping with grip strengthener. Pt. worked on sustaining grip while grasping pegs and reaching at various heights. Gripper was placed in the resistive slot with the white resistive spring. Pt used a 2# cuff weight. Pt. Worked on pinch strengthening in the left hand for lateral, and 3pt. pinch using yellow, red, green, blue, and black resistive clips. Pt. worked on placing the clips at various vertical and horizontal angles. Tactile and verbal cues were required for eliciting the desired movement.

## 2016-02-08 NOTE — Therapy (Signed)
[facility] MAIN [facility] SERVICES [address] [place], [place], [address] Phone: [phone]   Fax:  [phone]  Occupational Therapy Treatment  Patient Details  Name: Dalton Mathis MRN: [id number] Date of Birth: 07/23/1965 Referring Provider: Dr. Merrily Brittle  Encounter Date: 02/08/2016      OT End of Session - 02/08/16 1641    Visit Number 5   Number of Visits 24   Date for OT Re-Evaluation 04/15/16   Authorization Type 5 of 10   OT Start Time 1347   OT Stop Time 1430   OT Time Calculation (min) 43 min   Activity Tolerance Patient tolerated treatment well   Behavior During Therapy [facility] for tasks assessed/performed      Past Medical History:  Diagnosis Date  . Hypertension   . Stroke [facility]     No past surgical history on file.  There were no vitals filed for this visit.      Subjective Assessment - 02/08/16 1639    Subjective  Pt. reports he feel as thought the numbness in his left hand is improving.   Patient is accompained by: Family member   Pertinent History Pt. is a 51 y.o. male who suffered from a CVA on 12/27/2015. Pt. received OT/PT services at [facility] inpatient rehab. Pt. returned home on 01/19/2016. Pt. is now appropriate for outpatient services to address residual LUE deficits from the CVA.   Limitations LUE functioning   Currently in Pain? No/denies   Multiple Pain Sites No      OT TREATMENT    Neuro muscular re-education:  Pt.  Focused on improving [facility] with manipulating nuts and bolts positioned vertically. Pt. Was able to unscrew both the 1" and 1/2" nuts, however was unable to remove the nuts without dropping them. Pt. Worked on picking up the nuts from the table and placing them back onto the bolt. Pt. Had more difficulty grasping and placing the nuts into position and alignment. Pt. required increased time and cues for hand control during the task.  Therapeutic Exercise:  Pt. Worked on the [employer] for  8 min. With constant monitoring of the BUEs. Pt. Worked on changing, and alternating forward reverse position every 2 min. Rest breaks were required. Pt. performed gross gripping with grip strengthener. Pt. worked on sustaining grip while grasping pegs and reaching at various heights. Gripper was placed in the resistive slot with the white resistive spring. Pt used a 2# cuff weight. Pt. Worked on pinch strengthening in the left hand for lateral, and 3pt. pinch using yellow, red, green, blue, and black resistive clips. Pt. worked on placing the clips at various vertical and horizontal angles. Tactile and verbal cues were required for eliciting the desired movement.                          OT Education - 02/08/16 1640    Education provided Yes   Education Details UE strength, and coordination   Person(s) Educated Patient   Methods Explanation   Comprehension Verbalized understanding             OT Long Term Goals - 01/22/16 1334      OT LONG TERM GOAL #1   Title Pt. will increase left grip strength by 10# to be able to hold and use an electri rayzor.   Baseline Left: 52#, Right 78#   Time 12   Period Weeks   Status New  OT LONG TERM GOAL #2   Title Pt. will improve left hand coordination skills by 6 sec. to be able to remove items from his wallet efficiently.   Baseline Left: 1 min., R: 24 sec.   Time 12   Period Weeks   Status New     OT LONG TERM GOAL #3   Title Pt. will improve Left UE strength by 2 muscle grades in preparation to perform work related tasks.   Baseline LUE weakness   Time 12   Period Weeks   Status New     OT LONG TERM GOAL #4   Title Pt. will accurately pick up and place iitems of varying weight 100% of the time with the left hand in preparation for household tasks.   Baseline Pt. has difficulty   Time 12   Period Weeks   Status New     OT LONG TERM GOAL #5   Title Pt. will improve Left hand awareness to be able to hike  clothing independently.   Baseline Pt. has difficulty.   Time 12   Period Weeks   Status New               Plan - 02/08/16 1642    Clinical Impression Statement Pt. has made progress overall with LUE strength, coordination skills, and LUE awareness. Pt. continues to benefit from skilled OT services to work on improving proximal shoulder strength for sustained elevation while using hand during ADLs for ADL and IADL functioning.   Rehab Potential Good   OT Frequency 2x / week   OT Duration 12 weeks   OT Treatment/Interventions Self-care/ADL training;Therapeutic exercise;Functional Mobility Training;Patient/family education;Neuromuscular education;Energy conservation;Therapeutic exercises;Therapeutic activities;DME and/or AE instruction;Moist Heat   Consulted and Agree with Plan of Care Patient      Patient will benefit from skilled therapeutic intervention in order to improve the following deficits and impairments:  Decreased strength, Decreased activity tolerance, Decreased coordination, Impaired UE functional use, Pain, Impaired perceived functional ability, Decreased mobility, Decreased knowledge of use of DME, Decreased endurance, Impaired sensation  Visit Diagnosis: Muscle weakness (generalized)    Problem List There are no active problems to display for this patient.   Olegario MessierElaine Natanel Snavely, MS, OTR/L 02/08/2016, 4:54 PM  [place] [facility] MAIN [facility] [address] [place], [place], [address] Phone: [phone]   Fax:  [phone]  Name: Dalton Mathis MRN: [id number] Date of Birth: 07/17/1965

## 2016-02-10 ENCOUNTER — Ambulatory Visit: Payer: BC Managed Care – PPO | Admitting: Physical Therapy

## 2016-02-10 ENCOUNTER — Encounter: Payer: Self-pay | Admitting: Physical Therapy

## 2016-02-10 ENCOUNTER — Ambulatory Visit: Payer: BC Managed Care – PPO | Admitting: Occupational Therapy

## 2016-02-10 DIAGNOSIS — M6281 Muscle weakness (generalized): Secondary | ICD-10-CM

## 2016-02-10 DIAGNOSIS — R278 Other lack of coordination: Secondary | ICD-10-CM

## 2016-02-10 NOTE — Therapy (Signed)
[facility] MAIN [facility] SERVICES [address] [place], [place], [address] Phone: [phone]   Fax:  [phone]  Occupational Therapy Treatment  Patient Details  Name: Javaun Dimperio MRN: [id number] Date of Birth: 11-21-1965 Referring Provider: Dr. Merrily Brittle  Encounter Date: 02/10/2016      OT End of Session - 02/10/16 1611    Visit Number 6   Number of Visits 24   Date for OT Re-Evaluation 04/15/16   Authorization Type 6 of 10   OT Start Time 1350   OT Stop Time 1430   OT Time Calculation (min) 40 min   Activity Tolerance Patient tolerated treatment well   Behavior During Therapy [facility] for tasks assessed/performed      Past Medical History:  Diagnosis Date  . Hypertension   . Stroke [facility])     No past surgical history on file.  There were no vitals filed for this visit.      Subjective Assessment - 02/10/16 1421    Subjective  Pt. reports that his LUE is still numb.   Patient is accompained by: Family member   Limitations LUE functioning         OT TREATMENT    Neuro muscular re-education:  Pt. worked on proprioceptive input, and weightbearing through his LUE and hand while reaching with the right. Pt. Worked with weightbearing through the air disc, on the mat, and at the table. Pt. Worked with reaching, grasping, and turning shapes for the shape tower. Pt. Worked on flipping cards alternating with her thumb and digits with emphasis on control, and increasing speed. Pt. performed [facility] tasks using the Grooved pegboard. Pt. worked on grasping the grooved pegs from a horizontal position, and moving the pegs to a vertical position in the hand to prepare for placing them in the grooved slot.   Therapeutic Exercise:  Pt. Worked on the [employer] for 8 min. With constant monitoring of the BUEs. Pt. Worked on changing, and alternating forward reverse position every 2 min. Rest breaks were required.                           OT Education - 02/10/16 1525    Education provided Yes   Education Details LUE hand awareness.   Person(s) Educated Patient   Methods Explanation             OT Long Term Goals - 01/22/16 1334      OT LONG TERM GOAL #1   Title Pt. will increase left grip strength by 10# to be able to hold and use an electri rayzor.   Baseline Left: 52#, Right 78#   Time 12   Period Weeks   Status New     OT LONG TERM GOAL #2   Title Pt. will improve left hand coordination skills by 6 sec. to be able to remove items from his wallet efficiently.   Baseline Left: 1 min., R: 24 sec.   Time 12   Period Weeks   Status New     OT LONG TERM GOAL #3   Title Pt. will improve Left UE strength by 2 muscle grades in preparation to perform work related tasks.   Baseline LUE weakness   Time 12   Period Weeks   Status New     OT LONG TERM GOAL #4   Title Pt. will accurately pick up and place iitems of varying weight 100% of the time  with the left hand in preparation for household tasks.   Baseline Pt. has difficulty   Time 12   Period Weeks   Status New     OT LONG TERM GOAL #5   Title Pt. will improve Left hand awareness to be able to hike clothing independently.   Baseline Pt. has difficulty.   Time 12   Period Weeks   Status New               Plan - 02/10/16 1527    Clinical Impression Statement Pt. has made progress overall with LUE functioning. Pt.continues to have difficulty with LUE awareness at times. Pt. has difficulty figuring out where his LUE is in space at times, and does not always realize it is in his pocket. Pt. continues to benefit from skilled OT services to improve LUE awareness, and LUE functioning for use during ADL and IADL tasks..   Rehab Potential Good   OT Frequency 2x / week   OT Duration 12 weeks   OT Treatment/Interventions Self-care/ADL training;Therapeutic exercise;Functional Mobility Training;Patient/family  education;Neuromuscular education;Energy conservation;Therapeutic exercises;Therapeutic activities;DME and/or AE instruction;Moist Heat   Consulted and Agree with Plan of Care Patient      Patient will benefit from skilled therapeutic intervention in order to improve the following deficits and impairments:  Decreased strength, Decreased activity tolerance, Decreased coordination, Impaired UE functional use, Pain, Impaired perceived functional ability, Decreased mobility, Decreased knowledge of use of DME, Decreased endurance, Impaired sensation  Visit Diagnosis: Muscle weakness (generalized)  Other lack of coordination    Problem List There are no active problems to display for this patient.   Olegario MessierElaine Rayne Loiseau, MS, OTR/L 02/10/2016, 4:14 PM  [place] [facility] [address] [place], [place], [address] Phone: [phone]   Fax:  [phone]  Name: Malachi CarlLawrence Brubacher MRN: [id number] Date of Birth: [birth year]

## 2016-02-10 NOTE — Patient Instructions (Addendum)
OT TREATMENT    Neuro muscular re-education:  Pt. worked on proprioceptive input, and weightbearing through his LUE and hand while reaching with the right. Pt. Worked with weightbearing through the air disc, on the mat, and at the table. Pt. Worked with reaching, grasping, and turning shapes for the shape tower. Pt. Worked on flipping cards alternating with her thumb and digits with emphasis on control, and increasing speed. Pt. performed [facility] tasks using the Grooved pegboard. Pt. worked on grasping the grooved pegs from a horizontal position, and moving the pegs to a vertical position in the hand to prepare for placing them in the grooved slot.   Therapeutic Exercise:  Pt. Worked on the [employer] for 8 min. With constant monitoring of the BUEs. Pt. Worked on changing, and alternating forward reverse position every 2 min. Rest breaks were required.

## 2016-02-10 NOTE — Therapy (Signed)
[place] [facility] [address] [place], [place], [address] Phone: [phone]   Fax:  [phone]  Physical Therapy Treatment  Patient Details  Name: Dalton Mathis MRN: [id number] Date of Birth: [birth year] Referring Provider: Merrily BrittleAUCH, KIMBERLY KARRAT  Encounter Date: 02/10/2016      PT End of Session - 02/10/16 1441    Visit Number 3   Number of Visits 25   Date for PT Re-Evaluation 04/25/16   PT Start Time 0230   PT Stop Time 0300   PT Time Calculation (min) 30 min   Equipment Utilized During Treatment Gait belt   Activity Tolerance Patient tolerated treatment well   Behavior During Therapy [facility] for tasks assessed/performed      Past Medical History:  Diagnosis Date  . Hypertension   . Stroke [facility])     History reviewed. No pertinent surgical history.  There were no vitals filed for this visit.      Subjective Assessment - 02/10/16 1440    Subjective Patient report having sorness in left hip.    How long can you stand comfortably? 1/2 hour same as before the cva   How long can you walk comfortably? 30 mintues   Patient Stated Goals for his left leg to not be numb   Currently in Pain? No/denies   Multiple Pain Sites No    Therapeutic exercise: Tm 1. 5 miles/ hour x 10 mintues TM side stepping left and right . 5 miles / hour Matrix fwd/ bwd stepping x 5 Cross trainer octane x 5 minutes level 10 Leg press 150 lbs x 20 x 3, heel raises 20 x 3 Patient needs occasional verbal cueing to improve posture and cueing to correctly perform exercises slowly, holding at end of range to increase motor firing of desired muscle to encourage fatigue.                             PT Education - 02/10/16 1440    Education provided Yes   Education Details HEP with gym equipment   Person(s) Educated Patient   Methods Explanation   Comprehension Verbalized understanding             PT Long Term  Goals - 02/01/16 1412      PT LONG TERM GOAL #1   Title Patient will increase BLE gross strength to 4+/5 as to improve functional strength for independent gait, increased standing tolerance and increased ADL ability.   Time 4   Period Weeks   Status New     PT LONG TERM GOAL #2   Title Patient will reduce timed up and go to <11 seconds to reduce fall risk and demonstrate improved transfer/gait ability.   Time 4   Period Weeks   Status New     PT LONG TERM GOAL #3   Title Patient will be independent with ascend/descend 12 steps using single UE in step over step pattern without LOB.   Time 4   Period Weeks   Status New     PT LONG TERM GOAL #4   Title Patient will increase six minute walk test distance to >1200 for progression to community ambulator and improve gait ability   Time 4   Period Weeks   Status New               Plan - 02/10/16 1441    Clinical Impression Statement PT provided min  verbal instruction for proper use of LE, and improved posture and gait mechanics. Patient responded moderately to instruction   Rehab Potential Good   PT Frequency 2x / week   PT Duration 12 weeks   PT Treatment/Interventions Therapeutic exercise;Balance training;Neuromuscular re-education;Therapeutic activities;Gait training;Manual techniques   PT Next Visit Plan strengthening and balance    PT Home Exercise Plan heel raises, marching in place   Consulted and Agree with Plan of Care Patient      Patient will benefit from skilled therapeutic intervention in order to improve the following deficits and impairments:  Decreased balance, Impaired sensation, Obesity, Decreased activity tolerance, Decreased coordination, Decreased strength  Visit Diagnosis: Muscle weakness (generalized)  Other lack of coordination     Problem List There are no active problems to display for this patient. Ezekiel Ina, PT, DPT [place], Barkley Bruns S 02/10/2016, 2:46 PM  [facility] MAIN [facility] SERVICES [address] [place], [place], [address] Phone: [phone]   Fax:  [phone]  Name: Kody Brandl MRN: [id number] Date of Birth: 1965/11/09

## 2016-02-15 ENCOUNTER — Ambulatory Visit: Payer: BC Managed Care – PPO | Admitting: Occupational Therapy

## 2016-02-15 ENCOUNTER — Encounter: Payer: Self-pay | Admitting: Physical Therapy

## 2016-02-15 ENCOUNTER — Ambulatory Visit: Payer: BC Managed Care – PPO | Admitting: Physical Therapy

## 2016-02-15 DIAGNOSIS — M6281 Muscle weakness (generalized): Secondary | ICD-10-CM

## 2016-02-15 DIAGNOSIS — R278 Other lack of coordination: Secondary | ICD-10-CM

## 2016-02-15 NOTE — Therapy (Signed)
[place] [facility] MAIN [facility] SERVICES [address] [place], [place], [address] Phone: [phone]   Fax:  [phone]  Physical Therapy Treatment  Patient Details  Name: Dalton Mathis MRN: [id number] Date of Birth: 08-May-1966 Referring Provider: Merrily Brittle  Encounter Date: 02/15/2016      PT End of Session - 02/15/16 1440    Visit Number 4   Number of Visits 25   Date for PT Re-Evaluation 04/25/16   PT Start Time 0237   PT Stop Time 0317   PT Time Calculation (min) 40 min   Equipment Utilized During Treatment Gait belt   Activity Tolerance Patient tolerated treatment well   Behavior During Therapy [facility] for tasks assessed/performed      Past Medical History:  Diagnosis Date  . Hypertension   . Stroke [facility])     History reviewed. No pertinent surgical history.  There were no vitals filed for this visit.      Subjective Assessment - 02/15/16 1439    Subjective Patient report having sorness in left hip.    How long can you stand comfortably? 1/2 hour same as before the cva   How long can you walk comfortably? 30 mintues   Patient Stated Goals for his left leg to not be numb   Currently in Pain? No/denies   Multiple Pain Sites No       Therapeutic exercise: Leg press 130 lbs 20 x 3, heel raises x 20 x 3 baps board DF/PF, eve/inv and CW/ CCW x 20  TM 1.8 miles / hour x 10 mintues elevation 1  Patient needs occasional verbal cueing to improve posture and cueing to correctly perform exercises slowly, holding at end of range to increase motor firing of desired muscle to encourage fatigue.                            PT Education - 02/15/16 1439    Education provided Yes   Education Details HEP   Person(s) Educated Patient   Methods Explanation   Comprehension Verbalized understanding             PT Long Term Goals - 02/01/16 1412      PT LONG TERM GOAL #1   Title Patient will increase  BLE gross strength to 4+/5 as to improve functional strength for independent gait, increased standing tolerance and increased ADL ability.   Time 4   Period Weeks   Status New     PT LONG TERM GOAL #2   Title Patient will reduce timed up and go to <11 seconds to reduce fall risk and demonstrate improved transfer/gait ability.   Time 4   Period Weeks   Status New     PT LONG TERM GOAL #3   Title Patient will be independent with ascend/descend 12 steps using single UE in step over step pattern without LOB.   Time 4   Period Weeks   Status New     PT LONG TERM GOAL #4   Title Patient will increase six minute walk test distance to >1200 for progression to community ambulator and improve gait ability   Time 4   Period Weeks   Status New               Plan - 02/15/16 1441    Clinical Impression Statement Min cueing needed to appropriately perform balance and strengthening tasks with leg, hand, and head position.  Rehab Potential Good   PT Frequency 2x / week   PT Duration 12 weeks   PT Treatment/Interventions Therapeutic exercise;Balance training;Neuromuscular re-education;Therapeutic activities;Gait training;Manual techniques   PT Next Visit Plan strengthening and balance    PT Home Exercise Plan heel raises, marching in place   Consulted and Agree with Plan of Care Patient      Patient will benefit from skilled therapeutic intervention in order to improve the following deficits and impairments:  Decreased balance, Impaired sensation, Obesity, Decreased activity tolerance, Decreased coordination, Decreased strength  Visit Diagnosis: Muscle weakness (generalized)  Other lack of coordination     Problem List There are no active problems to display for this patient.   Ezekiel InaMansfield, Marye Eagen S 02/15/2016, 2:42 PM  [place] [facility] [address] [place], [place], [address] Phone: [phone]   Fax:   [phone]  Name: Dalton Mathis MRN: [id number] Date of Birth: 01/20/1966

## 2016-02-16 NOTE — Patient Instructions (Addendum)
OT TREATMENT    Neuro muscular re-education:  Pt. performed [facility] skills training to improve speed and dexterity needed for ADL tasks and writing. Pt. demonstrated grasping 1 inch sticks on the [employer] pegboard, and well as alternating speed with right, and left. Pt. Worked on bilateral hand coordination skills, and alternating movements with a stick while moving the stick in multiple directions.  Therapeutic Exercise:  Pt. Worked on the [employer] for 10 min. With constant monitoring of the BUEs, and hand placement on the handles. Pt. Worked on changing, and alternating forward reverse position every 2 min. Rest breaks were required. Pt. performed gross gripping with grip strengthener. Pt. worked on sustaining grip while grasping pegs and reaching at various heights. Gripper was placed in the 3rd resistive slot with the white resistive spring.

## 2016-02-16 NOTE — Therapy (Signed)
[facility] MAIN [facility] [address] [place], [place], [address] Phone: [phone]   Fax:  [phone]  Occupational Therapy Treatment  Patient Details  Name: Dalton Mathis MRN: [id number] Date of Birth: 1965/07/04 Referring Provider: Dr. Merrily BrittleKimberly Karrat Rauch  Encounter Date: 02/15/2016      OT End of Session - 02/15/16 1427    Visit Number 7   Number of Visits 24   Date for OT Re-Evaluation 04/15/16   Authorization Type 7 of 10   OT Start Time 1350   OT Stop Time 1430   OT Time Calculation (min) 40 min   Activity Tolerance Patient tolerated treatment well   Behavior During Therapy [facility] for tasks assessed/performed      Past Medical History:  Diagnosis Date  . Hypertension   . Stroke [facility](HCC)     No past surgical history on file.  There were no vitals filed for this visit.      Subjective Assessment - 02/15/16 1426    Subjective  Pt. reports the numbness in his left hand is getting better.   Patient is accompained by: Family member   Pertinent History Pt. is a 50 y.o. male who suffered from a CVA on 12/27/2015. [facility]. Pt. returned home on 01/19/2016. Pt. is now appropriate for outpatient services to address residual LUE deficits from the CVA.   Limitations LUE functioning   Currently in Pain? No/denies        OT TREATMENT    Neuro muscular re-education:  [facility]. performed [facility] skills training to improve speed and dexterity needed for ADL tasks and writing. Pt. demonstrated grasping 1 inch sticks on the Purdue pegboard, and well as alternating speed with right, and left. Pt. Worked on bilateral hand coordination skills, and alternating movements with a stick while moving the stick in multiple directions.  Therapeutic Exercise:  Pt. Worked on the [employer] for 10 min. With constant monitoring of the BUEs, and hand placement on the handles. Pt. Worked on changing, and alternating  forward reverse position every 2 min. Rest breaks were required. Pt. performed gross gripping with grip strengthener. Pt. worked on sustaining grip while grasping pegs and reaching at various heights. Gripper was placed in the 3rd resistive slot with the white resistive spring.                              OT Long Term Goals - 01/22/16 1334      OT LONG TERM GOAL #1   Title Pt. will increase left grip strength by 10# to be able to hold and use an electri rayzor.   Baseline Left: 52#, Right 78#   Time 12   Period Weeks   Status New     OT LONG TERM GOAL #2   Title Pt. will improve left hand coordination skills by 6 sec. to be able to remove items from his wallet efficiently.   Baseline Left: 1 min., R: 24 sec.   Time 12   Period Weeks   Status New     OT LONG TERM GOAL #3   Title Pt. will improve Left UE strength by 2 muscle grades in preparation to perform work related tasks.   Baseline LUE weakness   Time 12   Period Weeks   Status New     OT LONG TERM GOAL #4   Title Pt. will accurately pick up  and place iitems of varying weight 100% of the time with the left hand in preparation for household tasks.   Baseline Pt. has difficulty   Time 12   Period Weeks   Status New     OT LONG TERM GOAL #5   Title Pt. will improve Left hand awareness to be able to hike clothing independently.   Baseline Pt. has difficulty.   Time 12   Period Weeks   Status New               Plan - 02/15/16 1428    Clinical Impression Statement Pt. is making progress overall with LUE functioning. Pt. continues to present with limited Left hand grip strength, pinch strength, and coordination skills. Pt. could benefit from skilled OT serivces to improve UE for ADL tasks, and increase awareness. of the Left side.   Rehab Potential Good   OT Frequency 2x / week   OT Duration 12 weeks   OT Treatment/Interventions Self-care/ADL training;Therapeutic exercise;Functional  Mobility Training;Patient/family education;Neuromuscular education;Energy conservation;Therapeutic exercises;Therapeutic activities;DME and/or AE instruction;Moist Heat   Consulted and Agree with Plan of Care Patient      Patient will benefit from skilled therapeutic intervention in order to improve the following deficits and impairments:  Decreased strength, Decreased activity tolerance, Decreased coordination, Impaired UE functional use, Pain, Impaired perceived functional ability, Decreased mobility, Decreased knowledge of use of DME, Decreased endurance, Impaired sensation  Visit Diagnosis: Muscle weakness (generalized)  Other lack of coordination    Problem List There are no active problems to display for this patient.   [name], MS, OTR/L 02/16/2016, 2:21 PM  [facility] MAIN [facility] SERVICES [address] [place], [place], [address] Phone: [phone]   Fax:  [phone]  Name: Dalton Mathis MRN: [id number] Date of Birth: Oct 03, 1965

## 2016-02-17 ENCOUNTER — Ambulatory Visit: Payer: BC Managed Care – PPO | Admitting: Physical Therapy

## 2016-02-17 ENCOUNTER — Encounter: Payer: Self-pay | Admitting: Physical Therapy

## 2016-02-17 ENCOUNTER — Ambulatory Visit: Payer: BC Managed Care – PPO | Admitting: Occupational Therapy

## 2016-02-17 DIAGNOSIS — R278 Other lack of coordination: Secondary | ICD-10-CM

## 2016-02-17 DIAGNOSIS — M6281 Muscle weakness (generalized): Secondary | ICD-10-CM

## 2016-02-17 NOTE — Therapy (Signed)
[facility] MAIN [facility] [address] [place], [place], [address] Phone: [phone]   Fax:  [phone]  Physical Therapy Treatment  Patient Details  Name: Dalton Mathis MRN: [id number] Date of Birth: [birth year] Referring Provider: Merrily BrittleAUCH, KIMBERLY KARRAT  Encounter Date: 02/17/2016      PT End of Session - 02/17/16 1439    Visit Number 5   Number of Visits 25   Date for PT Re-Evaluation 04/25/16   PT Start Time 0235   PT Stop Time 0315   PT Time Calculation (min) 40 min   Equipment Utilized During Treatment Gait belt   Activity Tolerance Patient tolerated treatment well   Behavior During Therapy [facility] for tasks assessed/performed      Past Medical History:  Diagnosis Date  . Hypertension   . Stroke [facility](HCC)     History reviewed. No pertinent surgical history.  There were no vitals filed for this visit.      Subjective Assessment - 02/17/16 1437    Subjective Patient report having soreness in his left big toe.    How long can you stand comfortably? 1/2 hour same as before the cva   How long can you walk comfortably? 30 mintues   Patient Stated Goals for his left leg to not be numb   Currently in Pain? Yes   Pain Score 10-Worst pain ever   Pain Location Toe (Comment which one)   Pain Descriptors / Indicators --  stinging   Pain Type Acute pain   Pain Onset Yesterday   Pain Frequency Intermittent   Aggravating Factors  wiggling his toe   Multiple Pain Sites No      Therapeutic exercise: TM x 2. 0 for 10 mins without rest periods. Matrix side stepping x 5 left and right Side stepping on blue foam balance beam left and right x 10  Side stepping to 6 inch stool x 10 without UE from blue foam  Cross trainer x 10 minutes without rest periods, level  10  Machine knee extension with 2 plate x 20 x 3 Machine knee flex with 7 plates x 20 x 3 Leg press with 7 plates 20 x 3, heel raises x 20 x 3  Patient needs cuing for  posture and correct technique                           PT Education - 02/17/16 1439    Education provided Yes   Education Details HEP   Person(s) Educated Patient   Methods Explanation   Comprehension Verbalized understanding             PT Long Term Goals - 02/01/16 1412      PT LONG TERM GOAL #1   Title Patient will increase BLE gross strength to 4+/5 as to improve functional strength for independent gait, increased standing tolerance and increased ADL ability.   Time 4   Period Weeks   Status New     PT LONG TERM GOAL #2   Title Patient will reduce timed up and go to <11 seconds to reduce fall risk and demonstrate improved transfer/gait ability.   Time 4   Period Weeks   Status New     PT LONG TERM GOAL #3   Title Patient will be independent with ascend/descend 12 steps using single UE in step over step pattern without LOB.   Time 4   Period Weeks   Status  New     PT LONG TERM GOAL #4   Title Patient will increase six minute walk test distance to >1200 for progression to community ambulator and improve gait ability   Time 4   Period Weeks   Status New               Plan - 02/17/16 1439    Clinical Impression Statement Fatigue still evident with cross trainer and endurance. Patient is able to perform all exercises without pain behaviors.   Rehab Potential Good   PT Frequency 2x / week   PT Duration 12 weeks   PT Treatment/Interventions Therapeutic exercise;Balance training;Neuromuscular re-education;Therapeutic activities;Gait training;Manual techniques   PT Next Visit Plan strengthening and balance    PT Home Exercise Plan heel raises, marching in place   Consulted and Agree with Plan of Care Patient      Patient will benefit from skilled therapeutic intervention in order to improve the following deficits and impairments:  Decreased balance, Impaired sensation, Obesity, Decreased activity tolerance, Decreased coordination,  Decreased strength  Visit Diagnosis: Muscle weakness (generalized)  Other lack of coordination     Problem List There are no active problems to display for this patient.   Ezekiel Ina 02/17/2016, 2:41 PM  [facility] [facility] MAIN [facility] SERVICES [address] [place], [place], [address] Phone: [phone]   Fax:  [phone]  Name: Dalton Mathis MRN: [id number] Date of Birth: 1965/05/29

## 2016-02-18 NOTE — Patient Instructions (Signed)
OT TREATMENT    Neuro muscular re-education:  Pt. worked on tasks to sustain lateral pinch on resistive tweezers while grasping and moving 2" toothpick sticks from a horizontal flat position to a vertical position in order to place it in the holder. Pt. was able to sustain grasp while positioning and extending the wrist/hand in the necessary alignment needed to place the stick through the top of the holder. Pt. Requires verbal cues, and visual demonstration for proper hand position, and alignment to encourage wrist extension while maintaining grasp on tweezers.  Therapeutic Exercise:  Pt. Worked on the [employer] for 10 min. With constant monitoring of the BUEs. Pt. Worked on changing, and alternating forward reverse position every 2 min. Rest breaks were required. Pt. performed gross gripping with grip strengthener. Pt. worked on sustaining grip while grasping pegs and reaching at various heights. Gripper was placed in the 4th resistive slot with the white resistive spring with the LUE. Pt. Worked on pinch strengthening in the left hand for lateral, and 3pt. pinch using yellow, red, green, and blue resistive clips. Pt. worked on placing the clips at various vertical and horizontal angles. Tactile and verbal cues were required for eliciting the desired movement.

## 2016-02-18 NOTE — Therapy (Signed)
[facility] MAIN [facility] SERVICES [address] [place], [place], [address] Phone: [phone]   Fax:  [phone]  Occupational Therapy Treatment  Patient Details  Name: Dalton Mathis MRN: [id number] Date of Birth: May 23, 1966 Referring Provider: Dr. Merrily Brittle  Encounter Date: 02/17/2016      OT End of Session - 02/18/16 1133    Visit Number 8   Number of Visits 24   Date for OT Re-Evaluation 04/15/16   Authorization Type 8 of 10   OT Start Time 1348   OT Stop Time 1428   OT Time Calculation (min) 40 min   Activity Tolerance Patient tolerated treatment well   Behavior During Therapy [facility] for tasks assessed/performed      Past Medical History:  Diagnosis Date  . Hypertension   . Stroke [facility])     No past surgical history on file.  There were no vitals filed for this visit.      Subjective Assessment - 02/18/16 1131    Subjective  Pt. reports having new onset of swelling in his left LE.   Patient is accompained by: Family member   Pertinent History Pt. is a 50 y.o. male who suffered from a CVA on 12/27/2015. Pt. received OT/PT services at [facility] inpatient rehab. Pt. returned home on 01/19/2016. Pt. is now appropriate for outpatient services to address residual LUE deficits from the CVA.   Limitations LUE functioning   Currently in Pain? Yes   Pain Score 0-No pain      OT TREATMENT    Neuro muscular re-education:  Pt. worked on tasks to sustain lateral pinch on resistive tweezers while grasping and moving 2" toothpick sticks from a horizontal flat position to a vertical position in order to place it in the holder. Pt. was able to sustain grasp while positioning and extending the wrist/hand in the necessary alignment needed to place the stick through the top of the holder. Pt. Requires verbal cues, and visual demonstration for proper hand position, and alignment to encourage wrist extension while maintaining grasp on  tweezers.  Therapeutic Exercise:  Pt. Worked on the [employer] for 10 min. With constant monitoring of the BUEs. Pt. Worked on changing, and alternating forward reverse position every 2 min. Rest breaks were required. Pt. performed gross gripping with grip strengthener. Pt. worked on sustaining grip while grasping pegs and reaching at various heights. Gripper was placed in the 4th resistive slot with the white resistive spring with the LUE. Pt. Worked on pinch strengthening in the left hand for lateral, and 3pt. pinch using yellow, red, green, and blue resistive clips. Pt. worked on placing the clips at various vertical and horizontal angles. Tactile and verbal cues were required for eliciting the desired movement.                           OT Education - 02/18/16 1132    Education provided Yes             OT Long Term Goals - 01/22/16 1334      OT LONG TERM GOAL #1   Title Pt. will increase left grip strength by 10# to be able to hold and use an electri rayzor.   Baseline Left: 52#, Right 78#   Time 12   Period Weeks   Status New     OT LONG TERM GOAL #2   Title Pt. will improve left hand coordination skills by 6  sec. to be able to remove items from his wallet efficiently.   Baseline Left: 1 min., R: 24 sec.   Time 12   Period Weeks   Status New     OT LONG TERM GOAL #3   Title Pt. will improve Left UE strength by 2 muscle grades in preparation to perform work related tasks.   Baseline LUE weakness   Time 12   Period Weeks   Status New     OT LONG TERM GOAL #4   Title Pt. will accurately pick up and place iitems of varying weight 100% of the time with the left hand in preparation for household tasks.   Baseline Pt. has difficulty   Time 12   Period Weeks   Status New     OT LONG TERM GOAL #5   Title Pt. will improve Left hand awareness to be able to hike clothing independently.   Baseline Pt. has difficulty.   Time 12   Period Weeks   Status New                Plan - 02/18/16 1133    Clinical Impression Statement Pt. conitnues to progress with his LUE strength, and coordination skills. Pt. does continue to benefit from skilled OT services to work on increasing awareness of the Left UE and hand during ADL tasks, grip strength, pinch strength, and FMC skills needed to complete ADL/IADL, and work related tasks.   Rehab Potential Good   OT Frequency 2x / week   OT Duration 12 weeks   OT Treatment/Interventions Self-care/ADL training;Therapeutic exercise;Functional Mobility Training;Patient/family education;Neuromuscular education;Energy conservation;Therapeutic exercises;Therapeutic activities;DME and/or AE instruction;Moist Heat   Consulted and Agree with Plan of Care Patient      Patient will benefit from skilled therapeutic intervention in order to improve the following deficits and impairments:  Decreased strength, Decreased activity tolerance, Decreased coordination, Impaired UE functional use, Pain, Impaired perceived functional ability, Decreased mobility, Decreased knowledge of use of DME, Decreased endurance, Impaired sensation  Visit Diagnosis: Muscle weakness (generalized)  Other lack of coordination    Problem List There are no active problems to display for this patient.   Olegario MessierElaine Luetta Piazza, MS, OTR/L 02/18/2016, 11:45 AM  [facility] [address] [place], [place], [address] Phone: [phone]   Fax:  [phone]  Name: Dalton Mathis MRN: [id number] Date of Birth: 05/23/1966

## 2016-02-22 ENCOUNTER — Ambulatory Visit: Payer: BC Managed Care – PPO | Attending: Physical Medicine and Rehabilitation | Admitting: Occupational Therapy

## 2016-02-22 DIAGNOSIS — R278 Other lack of coordination: Secondary | ICD-10-CM | POA: Insufficient documentation

## 2016-02-22 DIAGNOSIS — M6281 Muscle weakness (generalized): Secondary | ICD-10-CM | POA: Diagnosis not present

## 2016-02-22 NOTE — Therapy (Signed)
[place] [facility] MAIN Dalton Mathis [address] [place], [place], [address] Phone: [phone]   Fax:  [phone]  Occupational Therapy Treatment  Patient Details  Name: Dalton CarlLawrence Lechtenberg MRN: [id number] Date of Birth: 06-01-1965 Referring Provider: Dr. Merrily BrittleKimberly Karrat Rauch  Encounter Date: 02/22/2016      OT End of Session - 02/22/16 1354    Visit Number 9   Number of Visits 24   Date for OT Re-Evaluation 04/15/16   Authorization Type 9 of 10   OT Start Time 1300   OT Stop Time 1345   OT Time Calculation (min) 45 min   Activity Tolerance Patient tolerated treatment well   Behavior During Therapy [facility] for tasks assessed/performed      Past Medical History:  Diagnosis Date  . Hypertension   . Stroke [facility](HCC)     No past surgical history on file.  There were no vitals filed for this visit.      Subjective Assessment - 02/22/16 1322    Currently in Pain? No/denies        OT TREATMENT    Neuro muscular re-education:  Pt. worked on tasks to sustain lateral pinch on resistive tweezers while grasping and moving 2" toothpick sticks from a horizontal flat position to a vertical position in order to place it in the holder. Pt. was able to sustain grasp while positioning and extending the wrist/hand in the necessary alignment needed to place the stick through the top of the holder.  Therapeutic Exercise:  Pt. Worked on the [employer] for 10 min. With constant monitoring of the BUEs. Pt. Worked on changing, and alternating forward reverse position every 2 min. Rest breaks were required. Pt. performed gross gripping with grip strengthener. Pt. worked on sustaining grip while grasping pegs and reaching at various heights. Gripper was placed in the 4th resistive slot with the white resistive spring. Pt. Worked on pinch strengthening in the left hand for lateral, and 3pt. pinch using green, and blue resistive clips. Pt. worked on placing the clips at  various vertical and horizontal angles. Tactile and verbal cues were required for eliciting the desired movement.                         OT Education - 02/22/16 1353    Education provided Yes   Education Details LUE and hand strengthening, hand position, and coodination.   Person(s) Educated Patient   Methods Explanation   Comprehension Verbalized understanding             OT Long Term Goals - 01/22/16 1334      OT LONG TERM GOAL #1   Title Pt. will increase left grip strength by 10# to be able to hold and use an electri rayzor.   Baseline Left: 52#, Right 78#   Time 12   Period Weeks   Status New     OT LONG TERM GOAL #2   Title Pt. will improve left hand coordination skills by 6 sec. to be able to remove items from his wallet efficiently.   Baseline Left: 1 min., R: 24 sec.   Time 12   Period Weeks   Status New     OT LONG TERM GOAL #3   Title Pt. will improve Left UE strength by 2 muscle grades in preparation to perform work related tasks.   Baseline LUE weakness   Time 12   Period Weeks   Status New  OT LONG TERM GOAL #4   Title Pt. will accurately pick up and place iitems of varying weight 100% of the time with the left hand in preparation for household tasks.   Baseline Pt. has difficulty   Time 12   Period Weeks   Status New     OT LONG TERM GOAL #5   Title Pt. will improve Left hand awareness to be able to hike clothing independently.   Baseline Pt. has difficulty.   Time 12   Period Weeks   Status New               Plan - 02/22/16 1354    Clinical Impression Statement Pt. presents with decreased awareness of the LUE and hand when reaching, and handling items. Pt. presents with impaired Left grip strength, pinch strength, and coordination skills. pt. conintues to benefit from skilled OT Mathis to work on improving left hand function, and awareness during ADLs, and IADLs.    Rehab Potential Good   OT Frequency 2x /  week   OT Treatment/Interventions Self-care/ADL training;Therapeutic exercise;Functional Mobility Training;Patient/family education;Neuromuscular education;Energy conservation;Therapeutic exercises;Therapeutic activities;DME and/or AE instruction;Moist Heat   Consulted and Agree with Plan of Care Patient      Patient will benefit from skilled therapeutic intervention in order to improve the following deficits and impairments:  Decreased strength, Decreased activity tolerance, Decreased coordination, Impaired UE functional use, Pain, Impaired perceived functional ability, Decreased mobility, Decreased knowledge of use of DME, Decreased endurance, Impaired sensation  Visit Diagnosis: Muscle weakness (generalized)  Other lack of coordination    Problem List There are no active problems to display for this patient.   Dalton Messier, MS, OTR/L 02/22/2016, 2:07 PM  [place] [facility] MAIN [facility] [address] [place], [place], [address] Phone: [phone]   Fax:  [phone]  Name: Dalton Mathis MRN: [id number] Date of Birth: 01-23-1966

## 2016-02-22 NOTE — Patient Instructions (Signed)
OT TREATMENT    Neuro muscular re-education:  Pt. worked on tasks to sustain lateral pinch on resistive tweezers while grasping and moving 2" toothpick sticks from a horizontal flat position to a vertical position in order to place it in the holder. Pt. was able to sustain grasp while positioning and extending the wrist/hand in the necessary alignment needed to place the stick through the top of the holder.  Therapeutic Exercise:  Pt. Worked on the [employer] for 10 min. With constant monitoring of the BUEs. Pt. Worked on changing, and alternating forward reverse position every 2 min. Rest breaks were required.  Selfcare. Pt. performed gross gripping with grip strengthener. Pt. worked on sustaining grip while grasping pegs and reaching at various heights. Gripper was placed in the 4th resistive slot with the white resistive spring. Pt. Worked on pinch strengthening in the left hand for lateral, and 3pt. pinch using green, and blue resistive clips. Pt. worked on placing the clips at various vertical and horizontal angles. Tactile and verbal cues were required for eliciting the desired movement.

## 2016-02-23 ENCOUNTER — Ambulatory Visit: Payer: BC Managed Care – PPO | Admitting: Physical Therapy

## 2016-02-25 ENCOUNTER — Ambulatory Visit: Payer: BC Managed Care – PPO | Admitting: Physical Therapy

## 2016-02-25 DIAGNOSIS — M6281 Muscle weakness (generalized): Secondary | ICD-10-CM | POA: Diagnosis not present

## 2016-02-25 DIAGNOSIS — R278 Other lack of coordination: Secondary | ICD-10-CM

## 2016-02-25 NOTE — Therapy (Signed)
[place] [facility] MAIN [facility] SERVICES [address] [place], [place], [address] Phone: [phone]   Fax:  [phone]  Physical Therapy Treatment  Patient Details  Name: Lakendrick Paradis MRN: [id number] Date of Birth: 06/17/1965 Referring Provider: Merrily Brittle  Encounter Date: 02/25/2016      PT End of Session - 02/25/16 1605    Visit Number 6   Number of Visits 25   Date for PT Re-Evaluation 04/25/16   PT Start Time 0400   PT Stop Time 0440   PT Time Calculation (min) 40 min   Equipment Utilized During Treatment Gait belt   Activity Tolerance Patient tolerated treatment well   Behavior During Therapy [facility] for tasks assessed/performed      Past Medical History:  Diagnosis Date  . Hypertension   . Stroke [facility])     No past surgical history on file.  There were no vitals filed for this visit.      Subjective Assessment - 02/25/16 1603    Subjective Patient reports he wants to go back to work but he cant get up form the floor and cant run yet.   Currently in Pain? No/denies   Pain Score 0-No pain      TM walking at 2.0  m/hour and elevation of 2 x 10 mintues, side stepping left and right  . 5 miles/ hour x 3 minutes Octane trainer x 10 minutes level  Matrix side stepping 4 plates x 5 reps CGA and Min to mod verbal cues used throughout with increased in postural sway and LOB most seen with narrow base of support and while on uneven surfaces. Continues to have balance deficits typical with diagnosis. Patient performs intermediate level exercises without pain behaviors and needs verbal cuing for postural alignment                           PT Education - 02/25/16 1604    Education provided Yes   Education Details safety with balance   Person(s) Educated Patient   Methods Explanation   Comprehension Verbalized understanding             PT Long Term Goals - 02/01/16 1412      PT LONG TERM GOAL #1    Title Patient will increase BLE gross strength to 4+/5 as to improve functional strength for independent gait, increased standing tolerance and increased ADL ability.   Time 4   Period Weeks   Status New     PT LONG TERM GOAL #2   Title Patient will reduce timed up and go to <11 seconds to reduce fall risk and demonstrate improved transfer/gait ability.   Time 4   Period Weeks   Status New     PT LONG TERM GOAL #3   Title Patient will be independent with ascend/descend 12 steps using single UE in step over step pattern without LOB.   Time 4   Period Weeks   Status New     PT LONG TERM GOAL #4   Title Patient will increase six minute walk test distance to >1200 for progression to community ambulator and improve gait ability   Time 4   Period Weeks   Status New               Plan - 02/25/16 1605    Clinical Impression Statement Patient performs all exercises and is able to ambulate faster with elevation. He has no  pain today but continues to have a swollen foot. He continues to have RLE ankle instability with higher level gait and balance challenges.    Rehab Potential Good   PT Frequency 2x / week   PT Duration 12 weeks   PT Treatment/Interventions Therapeutic exercise;Balance training;Neuromuscular re-education;Therapeutic activities;Gait training;Manual techniques   PT Next Visit Plan strengthening and balance    PT Home Exercise Plan heel raises, marching in place   Consulted and Agree with Plan of Care Patient      Patient will benefit from skilled therapeutic intervention in order to improve the following deficits and impairments:  Decreased balance, Impaired sensation, Obesity, Decreased activity tolerance, Decreased coordination, Decreased strength  Visit Diagnosis: Muscle weakness (generalized)  Other lack of coordination     Problem List There are no active problems to display for this patient.  Ezekiel InaKristine S Elenie Coven, PT, DPT [place], [place]  S 02/25/2016, 4:11 PM  [place] [facility] [address] [place], [place], [address] Phone: [phone]   Fax:  [phone]  Name: Malachi CarlLawrence Winstanley MRN: [id number] Date of Birth: 1966-05-07

## 2016-03-01 ENCOUNTER — Encounter: Payer: Self-pay | Admitting: Physical Therapy

## 2016-03-01 ENCOUNTER — Ambulatory Visit: Payer: BC Managed Care – PPO | Admitting: Physical Therapy

## 2016-03-01 VITALS — BP 115/67 | HR 55

## 2016-03-01 DIAGNOSIS — M6281 Muscle weakness (generalized): Secondary | ICD-10-CM | POA: Diagnosis not present

## 2016-03-01 DIAGNOSIS — R278 Other lack of coordination: Secondary | ICD-10-CM

## 2016-03-01 NOTE — Therapy (Signed)
[place] [facility] MAIN [facility] SERVICES [address] [place], [place], [address] Phone: [phone]   Fax:  [phone]  Physical Therapy Treatment  Patient Details  Name: Dalton Mathis MRN: [id number] Date of Birth: October 31, 1965 Referring Provider: Merrily Brittle  Encounter Date: 03/01/2016      PT End of Session - 03/01/16 1343    Visit Number 7   Number of Visits 25   Date for PT Re-Evaluation 04/25/16   PT Start Time 1340   PT Stop Time 1415   PT Time Calculation (min) 35 min   Equipment Utilized During Treatment Gait belt   Activity Tolerance Patient tolerated treatment well   Behavior During Therapy [facility] for tasks assessed/performed      Past Medical History:  Diagnosis Date  . Hypertension   . Stroke [facility])     History reviewed. No pertinent surgical history.  Vitals:   03/01/16 1344  BP: 115/67  Pulse: (!) 55  SpO2: 99%        Subjective Assessment - 03/01/16 1342    Subjective Pt arrived 10 minutes late to his appointment today.  Pt reports he has been going to the gym and completing his HEP each day he is not at PT.  He has no questions or concerns at this time.   Currently in Pain? No/denies        TREATMENT  Therapeutic exercise:  Seated L DF with RTB 3x15  Sit<>stand x10 without use of UEs and cues for controlled eccentric lowering  L hip hikes on step with UE support, 2x10  Sideways walking with RTB around knees x 3 lengths in each direction  Bil leg press, 150# 3x10 with cues for eccentric lowering and to push equally through BLEs  SLS on airex 2x30 seconds each side in // bars  Forward lunges onto bosu ball, 2x10 on each sid                 PT Education - 03/01/16 1343    Education provided Yes   Education Details Exercise technique   Person(s) Educated Patient   Methods Explanation;Demonstration   Comprehension Verbalized understanding;Returned demonstration             PT  Long Term Goals - 02/01/16 1412      PT LONG TERM GOAL #1   Title Patient will increase BLE gross strength to 4+/5 as to improve functional strength for independent gait, increased standing tolerance and increased ADL ability.   Time 4   Period Weeks   Status New     PT LONG TERM GOAL #2   Title Patient will reduce timed up and go to <11 seconds to reduce fall risk and demonstrate improved transfer/gait ability.   Time 4   Period Weeks   Status New     PT LONG TERM GOAL #3   Title Patient will be independent with ascend/descend 12 steps using single UE in step over step pattern without LOB.   Time 4   Period Weeks   Status New     PT LONG TERM GOAL #4   Title Patient will increase six minute walk test distance to >1200 for progression to community ambulator and improve gait ability   Time 4   Period Weeks   Status New               Plan - 03/01/16 1354    Clinical Impression Statement Pt tolerated all interventions well this date.  His LLE fatigues with therapeutic strengthening exercises but pt is very motivated.  He will continue to benefit from skilled PT interventions including strengthening and balance interventions.   Rehab Potential Good   PT Frequency 2x / week   PT Duration 12 weeks   PT Treatment/Interventions Therapeutic exercise;Balance training;Neuromuscular re-education;Therapeutic activities;Gait training;Manual techniques   PT Next Visit Plan strengthening and balance    PT Home Exercise Plan heel raises, marching in place   Consulted and Agree with Plan of Care Patient      Patient will benefit from skilled therapeutic intervention in order to improve the following deficits and impairments:  Decreased balance, Impaired sensation, Obesity, Decreased activity tolerance, Decreased coordination, Decreased strength  Visit Diagnosis: Muscle weakness (generalized)  Other lack of coordination     Problem List There are no active problems to display  for this patient.   Encarnacion ChuAshley Abashian PT, DPT 03/01/2016, 2:15 PM  [place] [facility] [address] [place], [place], [address] Phone: [phone]   Fax:  [phone]  Name: Dalton Mathis MRN: [id number] Date of Birth: December 11, 1965

## 2016-03-03 ENCOUNTER — Ambulatory Visit: Payer: BC Managed Care – PPO | Admitting: Occupational Therapy

## 2016-03-03 ENCOUNTER — Ambulatory Visit: Payer: BC Managed Care – PPO | Admitting: Physical Therapy

## 2016-03-03 ENCOUNTER — Encounter: Payer: Self-pay | Admitting: Physical Therapy

## 2016-03-03 ENCOUNTER — Encounter: Payer: Self-pay | Admitting: Occupational Therapy

## 2016-03-03 DIAGNOSIS — R278 Other lack of coordination: Secondary | ICD-10-CM

## 2016-03-03 DIAGNOSIS — M6281 Muscle weakness (generalized): Secondary | ICD-10-CM

## 2016-03-03 NOTE — Therapy (Signed)
[facility] MA[facility] [address] [place], [place], [address] Phone: [phone]   Fax:  [phone]  Physical Therapy Treatment  Patient Details  Name: Malachi CarlLawrence Dolin MRN: [id number] Date of Birth: 19-Oct-1965 Referring Provider: Merrily BrittleAUCH, KIMBERLY KARRAT  Encounter Date: 03/03/2016      PT End of Session - 03/03/16 1646    Visit Number 8   Number of Visits 25   Date for PT Re-Evaluation 04/25/16   PT Start Time 1545   PT Stop Time 1628   PT Time Calculation (min) 43 min   Equipment Utilized During Treatment Gait belt   Activity Tolerance Patient tolerated treatment well   Behavior During Therapy [facility] for tasks assessed/performed      Past Medical History:  Diagnosis Date  . Hypertension   . Stroke [facility](HCC)     History reviewed. No pertinent surgical history.  There were no vitals filed for this visit.      Subjective Assessment - 03/03/16 1638    Subjective Patient reports doing okay. He reports compliance with gym exercise. He does report increased left hip discomfort/weakness with prolonged walking on treadmill. Denies any pain currently. He reports, "I still have trouble with weakness in my left ankle and leg."   Currently in Pain? No/denies         TREATMENT: Warm up on Nustep BUE/BLE level 2 x3 min (unbilled);  PT instructed patient in advanced balance exercise: Standing on 1/2 bolster (flat side up): Heel/toe raises x15 with cues to keep knee straight for better ankle stretch and ROM; BUE wand flexion with feet in neutral, min A and mod VCs to improve trunk control for better balance x10 reps;  One foot on dyna disc, one foot on 4 inch step: BUE Ball pass side/side x10 each with mod VCs to utilize mirror for visual feedback to improve weight shift and postural control;  Sit<>stand from raised mat table with airex pad under feed, with BUE 4# ball pass x10;  Diagonal step over narrow beam with red tband  around ankles forward/backwards x2 laps each with min A for balance and mod VCs to increase step length and increase hip flexion for better foot clearance;  Ladder drills: Forward reciprocal walking x2 laps; Forward walking with big steps (skipping box) x4 laps with CGA and mod VCs to increase speed for better momentum and balance control; Step with opposite LE march 3 sec SLS x2 laps with min A and mod VCs to improve upper trunk control for better loss of balance; Out-out-in-in quick steps with cues to increase heel raise for better quickness on feet for improved coordination;  Gait in hallway: Forward/backward walking with ball toss x150 feet; Side stepping with ball toss x80 feet each direction with cues to positioning to increase hip abductor strengthening;  Patient required min VCs for balance stability, including to increase trunk control for less loss of balance with smaller base of support  Educated patient about stroke support group to improve overall wellness and reduce risk for recurrent stroke.                         PT Education - 03/03/16 1645    Education provided Yes   Education Details balance exercise, gait safety, stroke support group   Person(s) Educated Patient   Methods Explanation;Verbal cues   Comprehension Verbalized understanding;Returned demonstration;Verbal cues required             PT  Long Term Goals - 02/01/16 1412      PT LONG TERM GOAL #1   Title Patient will increase BLE gross strength to 4+/5 as to improve functional strength for independent gait, increased standing tolerance and increased ADL ability.   Time 4   Period Weeks   Status New     PT LONG TERM GOAL #2   Title Patient will reduce timed up and go to <11 seconds to reduce fall risk and demonstrate improved transfer/gait ability.   Time 4   Period Weeks   Status New     PT LONG TERM GOAL #3   Title Patient will be independent with ascend/descend 12 steps using  single UE in step over step pattern without LOB.   Time 4   Period Weeks   Status New     PT LONG TERM GOAL #4   Title Patient will increase six minute walk test distance to >1200 for progression to community ambulator and improve gait ability   Time 4   Period Weeks   Status New               Plan - 03/03/16 1646    Clinical Impression Statement Patient instructed in advanced balance and coordination exercise. He required mod VCs and min A for advanced exercise to improve weight shift and postural control. Patient requires min Vcs to improve foot clearance with gait tasks and when negotiating ladder. Patient would benefit from additional skilled PT intervention to improve balance/gait safety and reduce fall risk;    Rehab Potential Good   PT Frequency 2x / week   PT Duration 12 weeks   PT Treatment/Interventions Therapeutic exercise;Balance training;Neuromuscular re-education;Therapeutic activities;Gait training;Manual techniques   PT Next Visit Plan please address goals.   PT Home Exercise Plan heel raises, marching in place   Consulted and Agree with Plan of Care Patient      Patient will benefit from skilled therapeutic intervention in order to improve the following deficits and impairments:  Decreased balance, Impaired sensation, Obesity, Decreased activity tolerance, Decreased coordination, Decreased strength  Visit Diagnosis: Muscle weakness (generalized)  Other lack of coordination     Problem List There are no active problems to display for this patient.   Jurnie Garritano PT, DPT 03/03/2016, 4:48 PM  [place] [facility] MAIN [facility] SERVICES [address] [place], [place], [address] Phone: [phone]   Fax:  [phone]  Name: Astin Sayre MRN: [id number] Date of Birth: 04/15/1966

## 2016-03-03 NOTE — Therapy (Signed)
[place] MAIN [facility] SERVICES [address] [place], [place], [address] Phone: [phone]   Fax:  [phone]  Occupational Therapy Treatment/Discharge Summary  Patient Details  Name: Dalton Mathis MRN: [id number] Date of Birth: [birth year] Referring Provider: Dr. Leretha Dykes  Encounter Date: 03/03/2016      OT End of Session - 03/03/16 1646    Visit Number 10   Number of Visits 24   Date for OT Re-Evaluation 04/15/16   Authorization Type 10 of 10   OT Start Time 1508   OT Stop Time 1536   OT Time Calculation (min) 28 min   Activity Tolerance Patient tolerated treatment well   Behavior During Therapy [facility] for tasks assessed/performed      Past Medical History:  Diagnosis Date  . Hypertension   . Stroke [facility])     History reviewed. No pertinent surgical history.  There were no vitals filed for this visit.      Subjective Assessment - 03/03/16 1640    Subjective  Pt. reports he is now doing more with his hand.   Patient is accompained by: Family member   Pertinent History Pt. is a 50 y.o. male who suffered from a CVA on 12/27/2015. Pt. received OT/PT services at [facility] inpatient rehab. Pt. returned home on 01/19/2016. Pt. is now appropriate for outpatient services to address residual LUE deficits from the CVA.   Limitations LUE functioning   Currently in Pain? No/denies            [facility] OT Assessment - 03/03/16 0001      Coordination   Left 9 Hole Peg Test 32 sec.     Strength   Strength Assessment Site Shoulder   Left Shoulder Flexion 5/5   Left Shoulder ABduction 5/5   Right/Left Elbow Left   Left Elbow Flexion 5/5   Left Elbow Extension 5/5   Right/Left Forearm Left   Left Forearm Pronation 5/5   Left Forearm Supination 5/5   Right/Left Wrist Left   Left Wrist Flexion 5/5   Left Wrist Extension 5/5   Left Wrist Radial Deviation 5/5   Left Wrist Ulnar Deviation 5/5     Hand Function   Left Hand Grip  (lbs) 74   Left Hand Lateral Pinch 29 lbs   Left 3 point pinch 20 lbs                          OT Education - 03/03/16 1645    Education provided Yes   Education Details [place] skills   Person(s) Educated Patient   Methods Explanation;Demonstration   Comprehension Verbalized understanding;Returned demonstration             OT Long Term Goals - 03/03/16 1524      OT LONG TERM GOAL #1   Title Pt. will increase left grip strength by 10# to be able to hold and use an electri rayzor.   Baseline Left: 52#, Right 78#   Time 12   Period Weeks   Status Achieved     OT LONG TERM GOAL #2   Title Pt. will improve left hand coordination skills by 6 sec. to be able to remove items from his wallet efficiently.   Baseline Left: 1 min., R: 24 sec.   Time 12   Period Weeks   Status Achieved     OT LONG TERM GOAL #3   Title Pt. will improve Left UE strength  by 2 muscle grades in preparation to perform work related tasks.   Baseline LUE weakness   Time 12   Period Weeks   Status Achieved     OT LONG TERM GOAL #4   Title Pt. will accurately pick up and place iitems of varying weight 100% of the time with the left hand in preparation for household tasks.   Baseline Pt. has difficulty   Time 12   Period Weeks   Status Achieved     OT LONG TERM GOAL #5   Title Pt. will improve Left hand awareness to be able to hike clothing independently.   Baseline Pt. has difficulty.   Time 12   Period Weeks   Status Achieved               Plan - 03/03/16 1647    Clinical Impression Statement Pt. has made progress overall with his LUE strength, motor control, [place], and hand function skills. Pt goals have been met. Pt. is engaing his left hand more into daily ADL, and IADL tasks. Pt. is independently able to perform exercises, [place], sensory, and proprioceptive activities. Pt. OT skilled services are discharged at this time.    Rehab Potential Good   OT Frequency 2x / week    OT Duration 12 weeks   OT Treatment/Interventions Self-care/ADL training;Therapeutic exercise;Functional Mobility Training;Patient/family education;Neuromuscular education;Energy conservation;Therapeutic exercises;Therapeutic activities;DME and/or AE instruction;Moist Heat   Consulted and Agree with Plan of Care Patient      Patient will benefit from skilled therapeutic intervention in order to improve the following deficits and impairments:  Decreased strength, Decreased activity tolerance, Decreased coordination, Impaired UE functional use, Pain, Impaired perceived functional ability, Decreased mobility, Decreased knowledge of use of DME, Decreased endurance, Impaired sensation  Visit Diagnosis: Muscle weakness (generalized)  Other lack of coordination    Problem List There are no active problems to display for this patient.   Harrel Carina, MS, OTR/L 03/03/2016, 5:14 PM  [place] MAIN [facility] SERVICES [address] [place], [place], [address] Phone: [phone]   Fax:  [phone]  Name: Dalton Mathis MRN: [id number] Date of Birth: 06-29-1965

## 2016-03-07 ENCOUNTER — Encounter: Payer: Self-pay | Admitting: Physical Therapy

## 2016-03-07 ENCOUNTER — Ambulatory Visit: Payer: BC Managed Care – PPO | Admitting: Physical Therapy

## 2016-03-07 ENCOUNTER — Encounter: Payer: BC Managed Care – PPO | Admitting: Occupational Therapy

## 2016-03-07 DIAGNOSIS — R278 Other lack of coordination: Secondary | ICD-10-CM

## 2016-03-07 DIAGNOSIS — M6281 Muscle weakness (generalized): Secondary | ICD-10-CM | POA: Diagnosis not present

## 2016-03-07 NOTE — Therapy (Signed)
[place] [facility] MAIN [facility] SERVICES [address] [place], [place], [address] Phone: [phone]   Fax:  [phone]  Physical Therapy Treatment  Patient Details  Name: Dalton Mathis MRN: [id number] Date of Birth: [birth year] Referring Provider: Merrily Brittle  Encounter Date: 03/07/2016      PT End of Session - 03/07/16 1314    Visit Number 9   Number of Visits 25   Date for PT Re-Evaluation 04/25/16   PT Start Time 1300   PT Stop Time 1345   PT Time Calculation (min) 45 min   Equipment Utilized During Treatment Gait belt   Activity Tolerance Patient tolerated treatment well   Behavior During Therapy [facility] for tasks assessed/performed      Past Medical History:  Diagnosis Date  . Hypertension   . Stroke [facility])     History reviewed. No pertinent surgical history.  There were no vitals filed for this visit.      Subjective Assessment - 03/07/16 1312    Subjective Patient is doing well. He is doing his HEP. He has no reports of time.    How long can you stand comfortably? 1/2 hour same as before the cva   How long can you walk comfortably? 30 mintues   Patient Stated Goals for his left leg to not be numb   Currently in Pain? No/denies   Pain Score 0-No pain   Pain Onset Yesterday    NMR and therapeutic exercise:  TM walking on elevation 3 with side steps x 5 minutes left and right  baps board ccw and cw x 20 level 2 , DF/PF 20 alternating left and right leg Disc yellow and blue tandem standing without UE  Matrix fwd/bwd, side to side Patient needs occasional verbal cueing to improve posture and cueing to correctly perform exercises slowly, holding at end of range to increase motor firing of desired muscle to encourage fatigue.                            PT Education - 03/07/16 1313    Education provided Yes   Education Details balance deficits and HEP   Person(s) Educated Patient   Methods  Explanation   Comprehension Verbalized understanding             PT Long Term Goals - 02/01/16 1412      PT LONG TERM GOAL #1   Title Patient will increase BLE gross strength to 4+/5 as to improve functional strength for independent gait, increased standing tolerance and increased ADL ability.   Time 4   Period Weeks   Status New     PT LONG TERM GOAL #2   Title Patient will reduce timed up and go to <11 seconds to reduce fall risk and demonstrate improved transfer/gait ability.   Time 4   Period Weeks   Status New     PT LONG TERM GOAL #3   Title Patient will be independent with ascend/descend 12 steps using single UE in step over step pattern without LOB.   Time 4   Period Weeks   Status New     PT LONG TERM GOAL #4   Title Patient will increase six minute walk test distance to >1200 for progression to community ambulator and improve gait ability   Time 4   Period Weeks   Status New  Plan - 03/07/16 1316    Clinical Impression Statement  SBA used throughout with increased in postural sway and LOB most seen with weight shifting activities. CGA needed for head turns and trunk rotation to maintain balance and upright posture., UE support and CGA needed throughout Airex step up task.   Rehab Potential Good   PT Frequency 2x / week   PT Duration 12 weeks   PT Treatment/Interventions Therapeutic exercise;Balance training;Neuromuscular re-education;Therapeutic activities;Gait training;Manual techniques   PT Next Visit Plan please address goals.   PT Home Exercise Plan heel raises, marching in place   Consulted and Agree with Plan of Care Patient      Patient will benefit from skilled therapeutic intervention in order to improve the following deficits and impairments:  Decreased balance, Impaired sensation, Obesity, Decreased activity tolerance, Decreased coordination, Decreased strength  Visit Diagnosis: Muscle weakness (generalized)  Other lack  of coordination     Problem List There are no active problems to display for this patient.   Ezekiel InaM[place], Kristine S 03/07/2016, 1:19 PM  [place] [facility] [address] [place], [place], [address] Phone: [phone]   Fax:  [phone]  Name: Dalton Mathis MRN: [id number] Date of Birth: 12/27/1965

## 2016-03-09 ENCOUNTER — Encounter: Payer: BC Managed Care – PPO | Admitting: Occupational Therapy

## 2016-03-09 ENCOUNTER — Encounter: Payer: Self-pay | Admitting: Physical Therapy

## 2016-03-09 ENCOUNTER — Ambulatory Visit: Payer: BC Managed Care – PPO | Admitting: Physical Therapy

## 2016-03-09 DIAGNOSIS — R278 Other lack of coordination: Secondary | ICD-10-CM

## 2016-03-09 DIAGNOSIS — M6281 Muscle weakness (generalized): Secondary | ICD-10-CM | POA: Diagnosis not present

## 2016-03-09 NOTE — Therapy (Signed)
[facility] MAIN [facility] SERVICES [address] [place], [place], [address] Phone: [phone]   Fax:  [phone]  Physical Therapy Treatment  Patient Details  Name: Dalton Mathis MRN: [id number] Date of Birth: 1965-10-14 Referring Provider: Merrily Brittle  Encounter Date: 03/09/2016      PT End of Session - 03/09/16 1310    Visit Number 10   Number of Visits 25   Date for PT Re-Evaluation 04/25/16   PT Start Time 1300   PT Stop Time 1345   PT Time Calculation (min) 45 min   Equipment Utilized During Treatment Gait belt   Activity Tolerance Patient tolerated treatment well   Behavior During Therapy [facility] for tasks assessed/performed      Past Medical History:  Diagnosis Date  . Hypertension   . Stroke [facility])     History reviewed. No pertinent surgical history.  There were no vitals filed for this visit.      Subjective Assessment - 03/09/16 1309    Subjective Patient is doing well. He is doing his HEP. He has no reports of time.    How long can you stand comfortably? 1/2 hour same as before the cva   How long can you walk comfortably? 30 mintues   Patient Stated Goals for his left leg to not be numb   Currently in Pain? No/denies   Pain Score 0-No pain   Pain Onset Yesterday         NMR and therapeutic exercise:  TM walking on elevation 3 with side steps x 5 minutes left and right  baps board ccw and cw x 20 level 2 , DF/PF 20 alternating left and right leg Disc yellow and blue tandem standing without UE x 20 Matrix fwd/bwd, side to side Step ups from floor to step with disk x 20 Patient needs occasional verbal cueing to improve posture and cueing to correctly perform exercises slowly, holding at end of range to increase motor firing of desired muscle to encourage fatigue.                         PT Education - 03/09/16 1310    Education provided Yes   Education Details HEP   Person(s)  Educated Patient   Methods Explanation   Comprehension Verbalized understanding             PT Long Term Goals - 02/01/16 1412      PT LONG TERM GOAL #1   Title Patient will increase BLE gross strength to 4+/5 as to improve functional strength for independent gait, increased standing tolerance and increased ADL ability.   Time 4   Period Weeks   Status New     PT LONG TERM GOAL #2   Title Patient will reduce timed up and go to <11 seconds to reduce fall risk and demonstrate improved transfer/gait ability.   Time 4   Period Weeks   Status New     PT LONG TERM GOAL #3   Title Patient will be independent with ascend/descend 12 steps using single UE in step over step pattern without LOB.   Time 4   Period Weeks   Status New     PT LONG TERM GOAL #4   Title Patient will increase six minute walk test distance to >1200 for progression to community ambulator and improve gait ability   Time 4   Period Weeks   Status New  Plan - 03/09/16 1310    Clinical Impression Statement Requires CGA for safety and to maintain balance. Requires verbal cues to correct ankle and hip strategies to correct maintain balance   Rehab Potential Good   PT Frequency 2x / week   PT Duration 12 weeks   PT Treatment/Interventions Therapeutic exercise;Balance training;Neuromuscular re-education;Therapeutic activities;Gait training;Manual techniques   PT Next Visit Plan please address goals.   PT Home Exercise Plan heel raises, marching in place   Consulted and Agree with Plan of Care Patient      Patient will benefit from skilled therapeutic intervention in order to improve the following deficits and impairments:  Decreased balance, Impaired sensation, Obesity, Decreased activity tolerance, Decreased coordination, Decreased strength  Visit Diagnosis: Muscle weakness (generalized)  Other lack of coordination     Problem List There are no active problems to display for this  patient.  Ezekiel InaKristine S Toluwani Yadav, PT, DPT [place], [place] 03/09/2016, 1:16 PM  [facility] [address] [place], [place], [address] Phone: [phone]   Fax:  [phone]  Name: Dalton Mathis MRN: [id number] Date of Birth: 15-Dec-1965

## 2016-03-15 ENCOUNTER — Encounter: Payer: Self-pay | Admitting: Physical Therapy

## 2016-03-15 ENCOUNTER — Encounter: Payer: BC Managed Care – PPO | Admitting: Occupational Therapy

## 2016-03-15 ENCOUNTER — Ambulatory Visit: Payer: BC Managed Care – PPO | Admitting: Physical Therapy

## 2016-03-15 DIAGNOSIS — M6281 Muscle weakness (generalized): Secondary | ICD-10-CM | POA: Diagnosis not present

## 2016-03-15 NOTE — Therapy (Signed)
[facility] MAIN [facility] SERVICES [address] [place], [place], [address] Phone: [phone]   Fax:  [phone]  Physical Therapy Treatment  Patient Details  Name: Dalton Mathis MRN: [id number] Date of Birth: 10-23-1965 Referring Provider: Merrily Brittle  Encounter Date: 03/15/2016      PT End of Session - 03/15/16 1726    Visit Number 11   Number of Visits 25   Date for PT Re-Evaluation 04/25/16   PT Start Time 1617   PT Stop Time 1700   PT Time Calculation (min) 43 min   Equipment Utilized During Treatment Gait belt   Activity Tolerance Patient tolerated treatment well   Behavior During Therapy [facility] for tasks assessed/performed      Past Medical History:  Diagnosis Date  . Hypertension   . Stroke [facility])     History reviewed. No pertinent surgical history.  There were no vitals filed for this visit.      Subjective Assessment - 03/15/16 1725    Subjective Patient states he is doing well, performing HEP and gym workout. He states he was able to have decreased slap foot one day after the gym.    How long can you stand comfortably? 1/2 hour same as before the cva   How long can you walk comfortably? 30 mintues   Patient Stated Goals for his left leg to not be numb   Currently in Pain? No/denies   Pain Onset Yesterday     Treatment: Treadmill walking x5 minutes, forward at 1.3 mph x3 min. and sidestepping at 0.4 mph x1 min. Each LE leading, min VCs for increased step length during side stepping, CGA for safety BAPS board ant/post and lateral tapping, L3, minA for stability, 2 HHA, min VCs for initial performance of activity and using predominantly 1 LE Heel/toe raises, 2x10, minA for balance, min VCs to continue lifting L ankle in equal to R ankle during toe raises Ankle DF against red tband, 3x10, min VCs for initial postioning with opposite foot supporting red tband Forward lunges on BOSU ball, 2x10 each LE, CGA for  safety, min VCs for initial positioning, 1 HHA Static stance on half bolster (flat side up), 3x30 seconds, minA for stability, 0-1 HHA depending on balance, min VCs about purpose of neuromuscular re-ed Marches on airex pad, 2x10 Agility ladder, CGA throughout, min VCs for initiation of exercise    Forward stepping, skipping a square each step, x6, mild off balance, minA for stability    Lateral stepping, 1 foot in each box, x2    Lateral stepping, skipping a square each step, x4, minA for continued balance, min VCs to regain balance prn before stepping again.  Wall squats, 2x5, CGA for safety, SPT blocking L knee in case of buckling, min VCs to not allow L knee to perform knee hyperextension                             PT Education - 03/15/16 1725    Education provided Yes   Education Details balance exercises, educated on drop foot   Person(s) Educated Patient   Methods Explanation   Comprehension Verbalized understanding             PT Long Term Goals - 03/16/16 0910      PT LONG TERM GOAL #1   Title Patient will increase BLE gross strength to 4+/5 as to improve functional strength for independent gait,  increased standing tolerance and increased ADL ability.   Time 12   Period Weeks   Status On-going     PT LONG TERM GOAL #2   Title Patient will reduce timed up and go to <11 seconds to reduce fall risk and demonstrate improved transfer/gait ability.   Time 12   Period Weeks   Status On-going     PT LONG TERM GOAL #3   Title Patient will be independent with ascend/descend 12 steps using single UE in step over step pattern without LOB.   Time 12   Period Weeks   Status On-going     PT LONG TERM GOAL #4   Title Patient will increase six minute walk test distance to >1200 for progression to community ambulator and improve gait ability   Time 12   Period Weeks   Status On-going               Plan - 03/15/16 1726    Clinical Impression  Statement Patient requires CGA-minA during balance activities and min VCs for initial set up and improvement in performance of exercise activities. The patient demonstrates poor ankle DF and quad strength, which he notes he's had since the stroke. He would continue to benefit from skilled PT in order to address BLE strength, balance, and improve her ability to perform ADLs safely.   Rehab Potential Good   PT Frequency 2x / week   PT Duration 12 weeks   PT Treatment/Interventions Therapeutic exercise;Balance training;Neuromuscular re-education;Therapeutic activities;Gait training;Manual techniques   PT Next Visit Plan please address goals.   PT Home Exercise Plan heel raises, marching in place   Consulted and Agree with Plan of Care Patient      Patient will benefit from skilled therapeutic intervention in order to improve the following deficits and impairments:  Decreased balance, Impaired sensation, Obesity, Decreased activity tolerance, Decreased coordination, Decreased strength  Visit Diagnosis: Muscle weakness (generalized)     Problem List There are no active problems to display for this patient.  Trula Oreae Otis Burress, SPT This entire session was performed under direct supervision and direction of a licensed therapist/therapist assistant . I have personally read, edited and approve of the note as written.  Trotter,Margaret PT, DPT 03/16/2016, 9:11 AM  [facility] [address] [place], [place], [address] Phone: [phone]   Fax:  [phone]  Name: Dalton Mathis MRN: [id number] Date of Birth: 10-29-1965

## 2016-03-17 ENCOUNTER — Ambulatory Visit: Payer: BC Managed Care – PPO | Admitting: Physical Therapy

## 2016-03-17 ENCOUNTER — Encounter: Payer: Self-pay | Admitting: Physical Therapy

## 2016-03-17 ENCOUNTER — Encounter: Payer: BC Managed Care – PPO | Admitting: Occupational Therapy

## 2016-03-17 DIAGNOSIS — M6281 Muscle weakness (generalized): Secondary | ICD-10-CM

## 2016-03-17 DIAGNOSIS — R278 Other lack of coordination: Secondary | ICD-10-CM

## 2016-03-17 NOTE — Therapy (Signed)
[facility] MAIN [facility] SERVICES [address] [place], [place], [address] Phone: [phone]   Fax:  [phone]  Physical Therapy Treatment  Patient Details  Name: Dalton Mathis MRN: [id number] Date of Birth: 02/28/1966 Referring Provider: Merrily Brittle  Encounter Date: 03/17/2016      PT End of Session - 03/17/16 1309    Visit Number 12   Number of Visits 25   Date for PT Re-Evaluation 04/25/16   PT Start Time 0105   PT Stop Time 0145   PT Time Calculation (min) 40 min   Equipment Utilized During Treatment Gait belt   Activity Tolerance Patient tolerated treatment well   Behavior During Therapy [facility] for tasks assessed/performed      Past Medical History:  Diagnosis Date  . Hypertension   . Stroke [facility])     History reviewed. No pertinent surgical history.  There were no vitals filed for this visit.      Subjective Assessment - 03/17/16 1309    Subjective Patient states he is doing well, performing HEP and gym workout.    How long can you stand comfortably? 1/2 hour same as before the cva   How long can you walk comfortably? 30 mintues   Patient Stated Goals for his left leg to not be numb   Currently in Pain? No/denies   Pain Score 0-No pain   Pain Onset Yesterday   Multiple Pain Sites No        OUTCOME MEASURES: TEST Outcome Interpretation  5 times sit<>stand 13.73 sec >60 yo, >15 sec indicates increased risk for falls  10 meter walk test             1.64 m/s <1.0 m/s indicates increased risk for falls; limited community ambulator  Timed up and Go   8.18             sec <14 sec indicates increased risk for falls  6 minute walk test 1700               Feet 1000 feet is community ambulator   NMR: Matrix side stepping and bwd/fwd  walking x 5  Therapeutic exercise including side stepping on TM at elevation 2 and speed .5 miles / hour left and right x 3 minutes Leg press 150 lbs x 20 x 3 Patient needs  occasional verbal cueing to improve posture and cueing to correctly perform exercises slowly, holding at end of range to increase motor firing of desired muscle to encourage fatigue.                             PT Education - 03/17/16 1309    Education provided Yes   Education Details balance and strengthening   Person(s) Educated Patient   Methods Explanation   Comprehension Verbalized understanding             PT Long Term Goals - 03/16/16 0910      PT LONG TERM GOAL #1   Title Patient will increase BLE gross strength to 4+/5 as to improve functional strength for independent gait, increased standing tolerance and increased ADL ability.   Time 12   Period Weeks   Status On-going     PT LONG TERM GOAL #2   Title Patient will reduce timed up and go to <11 seconds to reduce fall risk and demonstrate improved transfer/gait ability.   Time 12   Period Weeks  Status On-going     PT LONG TERM GOAL #3   Title Patient will be independent with ascend/descend 12 steps using single UE in step over step pattern without LOB.   Time 12   Period Weeks   Status On-going     PT LONG TERM GOAL #4   Title Patient will increase six minute walk test distance to >1200 for progression to community ambulator and improve gait ability   Time 12   Period Weeks   Status On-going             Patient will benefit from skilled therapeutic intervention in order to improve the following deficits and impairments:     Visit Diagnosis: Muscle weakness (generalized)  Other lack of coordination     Problem List There are no active problems to display for this patient.   [place], Myrical Andujo S 03/17/2016, 1:10 PM  [facility] [address] [place], [place], [address] Phone: [phone]   Fax:  [phone]  Name: Dalton Mathis MRN: [id number] Date of Birth: Jan 05, 1966

## 2016-03-22 ENCOUNTER — Ambulatory Visit: Payer: BC Managed Care – PPO | Admitting: Physical Therapy

## 2016-03-22 ENCOUNTER — Encounter: Payer: Self-pay | Admitting: Physical Therapy

## 2016-03-22 ENCOUNTER — Encounter: Payer: BC Managed Care – PPO | Admitting: Occupational Therapy

## 2016-03-22 VITALS — BP 129/61 | HR 61

## 2016-03-22 DIAGNOSIS — R278 Other lack of coordination: Secondary | ICD-10-CM

## 2016-03-22 DIAGNOSIS — M6281 Muscle weakness (generalized): Secondary | ICD-10-CM

## 2016-03-22 NOTE — Therapy (Signed)
[facility] [address] [place], [place], [address] Phone: [phone]   Fax:  [phone]  Physical Therapy Treatment  Patient Details  Name: Dalton Mathis MRN: [id number] Date of Birth: [birth year] Referring Provider: Merrily BrittleAUCH, KIMBERLY KARRAT  Encounter Date: 03/22/2016      PT End of Session - 03/22/16 1707    Visit Number 13   Number of Visits 25   Date for PT Re-Evaluation 04/25/16   PT Start Time 1615   PT Stop Time 1658   PT Time Calculation (min) 43 min   Equipment Utilized During Treatment Gait belt   Activity Tolerance Patient tolerated treatment well   Behavior During Therapy [facility] for tasks assessed/performed      Past Medical History:  Diagnosis Date  . Hypertension   . Stroke [facility])     History reviewed. No pertinent surgical history.  Vitals:   03/22/16 1619  BP: 129/61  Pulse: 61  SpO2: 100%        Subjective Assessment - 03/22/16 1618    Subjective Pt reports no issues with HEP and that he has been going to the gym on days when he doesnt have PT.     Currently in Pain? No/denies     Treatment Lateral hops over line, 2 sets x 10 hops, min VCs for slower larger lateral steps   Exaggerated run single leg hops, x 6 laps, min VCs for increased R knee flexion and min VCs for increased step length, close supervision for safety   Hip flexion marches on airex pad while performing self ball toss, 2 sets x 10 reps,  Min VCs for slower more controlled steps , pt demonstrated difficulty with coordination of UE/LE activity, min VCs to increase hip flexion   Bosu ball squats, 2 sets x 10 reps, CGA for safety, min VCs for increased glut activation and upright posture   Ladder drills  -Quick abduction steps x 2 laps, min VCs for initial technique, CGA for safety  -Sideways and backwards stepping x 2 laps, min VCs for greater L foot clearance and anterior lean to correct posterior LOB, CGA for safety    Hip extension on cable machine, #7.5, 2 sets x 10 reps BLEs, min VCs for upright posture and increased glut activation                               PT Education - 03/22/16 1706    Education provided Yes   Education Details addition of cable machine exercises to home gym program    Person(s) Educated Patient   Methods Demonstration;Explanation;Verbal cues   Comprehension Verbalized understanding;Returned demonstration;Verbal cues required             PT Long Term Goals - 03/16/16 0910      PT LONG TERM GOAL #1   Title Patient will increase BLE gross strength to 4+/5 as to improve functional strength for independent gait, increased standing tolerance and increased ADL ability.   Time 12   Period Weeks   Status On-going     PT LONG TERM GOAL #2   Title Patient will reduce timed up and go to <11 seconds to reduce fall risk and demonstrate improved transfer/gait ability.   Time 12   Period Weeks   Status On-going     PT LONG TERM GOAL #3   Title Patient will be independent with ascend/descend 12 steps using single  UE in step over step pattern without LOB.   Time 12   Period Weeks   Status On-going     PT LONG TERM GOAL #4   Title Patient will increase six minute walk test distance to >1200 for progression to community ambulator and improve gait ability   Time 12   Period Weeks   Status On-going               Plan - 03/22/16 1708    Clinical Impression Statement Pt continues to report great compliance with HEP and gym program.  Initated high level balance activites to work towards pts goal of back to running.  Pt was able to perform both lateral hops and exagerated run without LOB with occasional HHA from parallel bar.  Pt performed various ladder drills with emphasis on speed and agility, pt was able to perform without LOB but demonstrated difficulty occasionally with L foot clearance.  Educated pt on cable machine exercises that he could  perform at his gym.  Pt would continue to benefit from further skilled PT to increase his LE strength and agility for more functional mobility.     Rehab Potential Good   PT Frequency 2x / week   PT Duration 12 weeks   PT Treatment/Interventions Therapeutic exercise;Balance training;Neuromuscular re-education;Therapeutic activities;Gait training;Manual techniques   PT Next Visit Plan please address goals.   PT Home Exercise Plan heel raises, marching in place   Consulted and Agree with Plan of Care Patient      Patient will benefit from skilled therapeutic intervention in order to improve the following deficits and impairments:  Decreased balance, Impaired sensation, Obesity, Decreased activity tolerance, Decreased coordination, Decreased strength, Cardiopulmonary status limiting activity  Visit Diagnosis: Muscle weakness (generalized)  Other lack of coordination     Problem List There are no active problems to display for this patient.  Waldon Reiningaylor Mada Sadik, SPT  This entire session was performed under direct supervision and direction of a licensed therapist/therapist assistant . I have personally read, edited and approve of the note as written.  Trotter,Margaret PT, DPT 03/22/2016, 5:45 PM  [facility] [address] [place], [place], [address] Phone: [phone]   Fax:  [phone]  Name: Dalton Mathis MRN: [id number] Date of Birth: 1965/10/28

## 2016-03-24 ENCOUNTER — Ambulatory Visit: Payer: BC Managed Care – PPO | Attending: Physical Medicine and Rehabilitation | Admitting: Physical Therapy

## 2016-03-24 ENCOUNTER — Ambulatory Visit: Payer: BC Managed Care – PPO | Admitting: Physical Therapy

## 2016-03-24 ENCOUNTER — Encounter: Payer: Self-pay | Admitting: Physical Therapy

## 2016-03-24 ENCOUNTER — Encounter: Payer: BC Managed Care – PPO | Admitting: Occupational Therapy

## 2016-03-24 VITALS — BP 124/71 | HR 63

## 2016-03-24 DIAGNOSIS — R278 Other lack of coordination: Secondary | ICD-10-CM

## 2016-03-24 DIAGNOSIS — M6281 Muscle weakness (generalized): Secondary | ICD-10-CM | POA: Insufficient documentation

## 2016-03-24 NOTE — Therapy (Signed)
[place] [facility] [address] [place], [place], [address] Phone: [phone]   Fax:  [phone]  Physical Therapy Treatment  Patient Details  Name: Dalton CarlLawrence Mathis MRN: [id number] Date of Birth: July 27, 1965 Referring Provider: Merrily BrittleAUCH, KIMBERLY KARRAT  Encounter Date: 03/24/2016      PT End of Session - 03/24/16 1154    Visit Number 14   Number of Visits 25   Date for PT Re-Evaluation 04/25/16   PT Start Time 1100   PT Stop Time 1146   PT Time Calculation (min) 46 min   Equipment Utilized During Treatment Gait belt   Activity Tolerance Patient tolerated treatment well   Behavior During Therapy [facility] for tasks assessed/performed      Past Medical History:  Diagnosis Date  . Hypertension   . Stroke [facility](HCC)     History reviewed. No pertinent surgical history.  Vitals:   03/24/16 1102  BP: 124/71  Pulse: 63  SpO2: 98%        Subjective Assessment - 03/24/16 1250    Subjective Pt reports he continues to go to the gym on his off days of therapy.  He is having a follow up with his neurologist today.     Currently in Pain? No/denies     Treatment Lateral single leg hops, 3 sets x 10 hops, min VCs to increase lateral distance and attempt to not touch down with opposite leg  Terminal knee extension with green theraband resistance standing, 2 sets x 10 reps each leg, min VCS for increased quad activation  Quick step ups, 2 sets x 10 reps leading with each LE, one HHA intermittently on 4 inch step, min VCs for increased hip and knee flexion for greater foot clearance  Ladder drills  -Abduction steps through ladder x 2 laps, min VCs for quicker speed, CGA for stability   -Forwards/backwards stepping x 2 laps, min VCs for increased backwards step length, CGA for stability   -Lateral stepping x 2 laps, min VCs for sequencing, CGA for safety  Hip extension on cable machine, 2 sets x 10 reps each leg, #7.5, min VCs to decrease  trunk flexion throughout  Hip abduction on cable machine, 2 sets x 10 reps each leg, #7.5, min VCs to keep leg extended throughout   Exaggerated run/slow jog x 6 laps, close supervision for safety, min VCs to increase knee flexion bilaterally  Monster walk x 4 laps with red theraband resistance, min VCs to maintain squat position and increase foot clearance  Single leg eccentric calf raise on 4 inch step, 1 set x 10 reps, min VCS to increase dorsiflexion ROM, 1 HHA                             PT Education - 03/24/16 1125    Education provided Yes   Education Details exercise technique    Person(s) Educated Patient   Methods Explanation;Demonstration;Verbal cues   Comprehension Verbalized understanding;Returned demonstration;Verbal cues required             PT Long Term Goals - 03/16/16 0910      PT LONG TERM GOAL #1   Title Patient will increase BLE gross strength to 4+/5 as to improve functional strength for independent gait, increased standing tolerance and increased ADL ability.   Time 12   Period Weeks   Status On-going     PT LONG TERM GOAL #2   Title  Patient will reduce timed up and go to <11 seconds to reduce fall risk and demonstrate improved transfer/gait ability.   Time 12   Period Weeks   Status On-going     PT LONG TERM GOAL #3   Title Patient will be independent with ascend/descend 12 steps using single UE in step over step pattern without LOB.   Time 12   Period Weeks   Status On-going     PT LONG TERM GOAL #4   Title Patient will increase six minute walk test distance to >1200 for progression to community ambulator and improve gait ability   Time 12   Period Weeks   Status On-going               Plan - 03/24/16 1154    Clinical Impression Statement Pt continues to work out at gym on opposite days of therapy session.  Continued to work on dynamic balance activites including ladder drills, quick step ups, and slow  exaggerated jog in short bouts.  Pt demonstrated progress in being able to perform with quicker steps and no LOB.  Initated further LE strengthening including abduction and hip extension on cable machine as well as resisted monster walks and eccentric single leg calf raises.  Pt requires min VCS to keep leg extended on cable machine and rest breaks after several sets due to LE fatigue.  He would continue to benefit from skilled PT to increase dynamic balance so pt can work towards goal of running again.     Rehab Potential Good   PT Frequency 2x / week   PT Duration 12 weeks   PT Treatment/Interventions Therapeutic exercise;Balance training;Neuromuscular re-education;Therapeutic activities;Gait training;Manual techniques   PT Next Visit Plan please address goals.   PT Home Exercise Plan heel raises, marching in place   Consulted and Agree with Plan of Care Patient      Patient will benefit from skilled therapeutic intervention in order to improve the following deficits and impairments:  Decreased balance, Impaired sensation, Obesity, Decreased activity tolerance, Decreased coordination, Decreased strength, Cardiopulmonary status limiting activity  Visit Diagnosis: Muscle weakness (generalized)  Other lack of coordination     Problem List There are no active problems to display for this patient.  Waldon Reiningaylor Fisher Hargadon, SPT  This entire session was performed under direct supervision and direction of a licensed therapist/therapist assistant . I have personally read, edited and approve of the note as written.  Trotter,Margaret PT, DPT 03/24/2016, 1:07 PM  [place] [facility] [address] [place], [place], [address] Phone: [phone]   Fax:  [phone]  Name: Dalton CarlLawrence Cheaney MRN: [id number] Date of Birth: 1965/08/15

## 2016-03-28 ENCOUNTER — Encounter: Payer: BC Managed Care – PPO | Admitting: Occupational Therapy

## 2016-03-29 ENCOUNTER — Encounter: Payer: Self-pay | Admitting: Physical Therapy

## 2016-03-29 ENCOUNTER — Ambulatory Visit: Payer: BC Managed Care – PPO | Admitting: Physical Therapy

## 2016-03-29 VITALS — BP 118/65 | HR 63

## 2016-03-29 DIAGNOSIS — R278 Other lack of coordination: Secondary | ICD-10-CM

## 2016-03-29 DIAGNOSIS — M6281 Muscle weakness (generalized): Secondary | ICD-10-CM | POA: Diagnosis not present

## 2016-03-29 NOTE — Therapy (Signed)
[place] [facility] [address] [place], [place], [address] Phone: [phone]   Fax:  [phone]  Physical Therapy Treatment  Patient Details  Name: Dalton Mathis MRN: [id number] Date of Birth: Jun 29, 1965 Referring Provider: Merrily BrittleAUCH, KIMBERLY KARRAT  Encounter Date: 03/29/2016      PT End of Session - 03/29/16 1514    Visit Number 14   Number of Visits 25   Date for PT Re-Evaluation 04/25/16   PT Start Time 1430   PT Stop Time 1514   PT Time Calculation (min) 44 min   Equipment Utilized During Treatment Gait belt   Activity Tolerance Patient tolerated treatment well   Behavior During Therapy [facility] for tasks assessed/performed      Past Medical History:  Diagnosis Date  . Hypertension   . Stroke [facility](HCC)     History reviewed. No pertinent surgical history.  Vitals:   03/29/16 1435  BP: 118/65  Pulse: 63  SpO2: 99%         Subjective Assessment - 03/29/16 1426    Subjective Pt reports he did not have his MD appointment last week instead it is this week.  Pt reports that his L knee has been buckling more often.     Currently in Pain? No/denies      Treatment Sideshuffling x 4 laps, close supervision, min VCs to remain in squat position and increase dorsiflexion to clear L foot  Quick step ups on 4 inch steps, 2 sets x 10 reps leading with each LE, no HHA, min VCs to increase speed for greater challenge Hip abduction on cable machine, #7.5, 2 sets x 10 reps, min VCs to stay upright, 1 HHA for stability  Hip extension on cable machine, #7.5, 2 sets x 10 reps, min VCs to keep back straight and perform with eccentric control  Monster walk with red theraband resistance, x 3 laps forwards/backwards, min VCs for larger lateral step and maintain squat position throughout  Terminal knee extension standing with green theraband resistance, 2 sets x 10 reps each LE with 3 second isometric squeeze  Lateral eccentric step  downs, 1 set x 10 reps each leg from a 6 inch step, min VCs for proper positioning Bosu lunges with explosive push off, 2 set x 10 reps alternating which LE steps, min VCs for upright posture throughout  Lateral weight shifting on rocker board, 2 sets x 10 reps, min VCs for posture and controlled lowering of rockerboard for greater challenge                          PT Education - 03/29/16 1455    Education provided Yes   Education Details maintaining HEP    Person(s) Educated Patient   Methods Explanation;Demonstration;Verbal cues   Comprehension Verbalized understanding;Returned demonstration;Verbal cues required             PT Long Term Goals - 03/16/16 0910      PT LONG TERM GOAL #1   Title Patient will increase BLE gross strength to 4+/5 as to improve functional strength for independent gait, increased standing tolerance and increased ADL ability.   Time 12   Period Weeks   Status On-going     PT LONG TERM GOAL #2   Title Patient will reduce timed up and go to <11 seconds to reduce fall risk and demonstrate improved transfer/gait ability.   Time 12   Period Weeks   Status On-going  PT LONG TERM GOAL #3   Title Patient will be independent with ascend/descend 12 steps using single UE in step over step pattern without LOB.   Time 12   Period Weeks   Status On-going     PT LONG TERM GOAL #4   Title Patient will increase six minute walk test distance to >1200 for progression to community ambulator and improve gait ability   Time 12   Period Weeks   Status On-going               Plan - 03/29/16 1515    Clinical Impression Statement Pt continues to demonstrate progress in his dynamic balance and ability to clear his L foot with quick movements.  Initated side shuffling and backwards monster walking with red theraband resistance to increase working towards pts goal of running.  Pt demonstrated quicker and smoother steps with 4 inch step ups  without LOB.  Continued to work on hip and quad strengthening for greater L knee stability.  He would benefit from further skilled PT to increase his dynamic balance and LE strength for more functional mobility.     Rehab Potential Good   PT Frequency 2x / week   PT Duration 12 weeks   PT Treatment/Interventions Therapeutic exercise;Balance training;Neuromuscular re-education;Therapeutic activities;Gait training;Manual techniques   PT Next Visit Plan please address goals.   PT Home Exercise Plan heel raises, marching in place   Consulted and Agree with Plan of Care Patient      Patient will benefit from skilled therapeutic intervention in order to improve the following deficits and impairments:  Decreased balance, Impaired sensation, Obesity, Decreased activity tolerance, Decreased coordination, Decreased strength, Cardiopulmonary status limiting activity  Visit Diagnosis: Other lack of coordination  Muscle weakness (generalized)     Problem List There are no active problems to display for this patient.  Waldon Reiningaylor Zeferino Mounts, SPT  This entire session was performed under direct supervision and direction of a licensed therapist/therapist assistant . I have personally read, edited and approve of the note as written.  Waldon Reiningaylor Johathon Overturf PT, DPT 03/29/2016, 3:21 PM  [place] [facility] [address] [place], [place], [address] Phone: [phone]   Fax:  [phone]  Name: Dalton Mathis MRN: [id number] Date of Birth: April 04, 1966

## 2016-04-05 ENCOUNTER — Ambulatory Visit: Payer: BC Managed Care – PPO | Admitting: Physical Therapy

## 2016-04-05 ENCOUNTER — Encounter: Payer: Self-pay | Admitting: Physical Therapy

## 2016-04-05 DIAGNOSIS — R278 Other lack of coordination: Secondary | ICD-10-CM

## 2016-04-05 DIAGNOSIS — M6281 Muscle weakness (generalized): Secondary | ICD-10-CM

## 2016-04-05 NOTE — Therapy (Signed)
[place] [facility] [address] [place], [place], [address] Phone: [phone]   Fax:  [phone]  Physical Therapy Treatment  Patient Details  Name: Dalton Mathis MRN: [id number] Date of Birth: [birth year] Referring Provider: Merrily BrittleAUCH, KIMBERLY KARRAT  Encounter Date: 04/05/2016      PT End of Session - 04/05/16 1135    Visit Number 16   Number of Visits 25   Date for PT Re-Evaluation 04/25/16   PT Start Time 1100   PT Stop Time 1145   PT Time Calculation (min) 45 min   Equipment Utilized During Treatment Gait belt   Activity Tolerance Patient tolerated treatment well   Behavior During Therapy [facility] for tasks assessed/performed      Past Medical History:  Diagnosis Date  . Hypertension   . Stroke [facility](HCC)     History reviewed. No pertinent surgical history.  There were no vitals filed for this visit.      Subjective Assessment - 04/05/16 1133    Subjective Patient is doing well today.    How long can you stand comfortably? 1/2 hour same as before the cva   How long can you walk comfortably? 30 mintues   Patient Stated Goals for his left leg to not be numb   Pain Onset Yesterday      Therapeutic exercise: TM running at 3. 5 miles / hour x 2 minutes with UE support, walking fast 3.1 to 3.5 without UE support Floor to stand transfer x 5 with UE support and wall support Kneeling to 1/2 kneeling x 5  Quadriped alternating opposite UE and LE and hold x 5 sec Quadriped LE extension with 5 second hold Patient is fatigued after each above exercise and needs 4 seated rest periods to continue . He has poor posture during the quadriped and is not able to extend his LE's out due to weakness. Patient is having left knee pain due to weight bearing on knees.                            PT Education - 04/05/16 1133    Education provided Yes   Education Details getting up from the floor   Person(s)  Educated Patient   Methods Explanation   Comprehension Verbalized understanding             PT Long Term Goals - 03/16/16 0910      PT LONG TERM GOAL #1   Title Patient will increase BLE gross strength to 4+/5 as to improve functional strength for independent gait, increased standing tolerance and increased ADL ability.   Time 12   Period Weeks   Status On-going     PT LONG TERM GOAL #2   Title Patient will reduce timed up and go to <11 seconds to reduce fall risk and demonstrate improved transfer/gait ability.   Time 12   Period Weeks   Status On-going     PT LONG TERM GOAL #3   Title Patient will be independent with ascend/descend 12 steps using single UE in step over step pattern without LOB.   Time 12   Period Weeks   Status On-going     PT LONG TERM GOAL #4   Title Patient will increase six minute walk test distance to >1200 for progression to community ambulator and improve gait ability   Time 12   Period Weeks   Status On-going  Plan - 04/05/16 1135    Clinical Impression Statement Patient has difficulty with floor to stand transfer and tall kneeling to 1/2 kneeling positions. Patient worked on running on TM with UE support,  and fast walking without UE support.    Rehab Potential Good   PT Frequency 2x / week   PT Duration 12 weeks   PT Treatment/Interventions Therapeutic exercise;Balance training;Neuromuscular re-education;Therapeutic activities;Gait training;Manual techniques   PT Next Visit Plan please address goals.   PT Home Exercise Plan heel raises, marching in place   Consulted and Agree with Plan of Care Patient      Patient will benefit from skilled therapeutic intervention in order to improve the following deficits and impairments:  Decreased balance, Impaired sensation, Obesity, Decreased activity tolerance, Decreased coordination, Decreased strength, Cardiopulmonary status limiting activity  Visit Diagnosis: Other lack of  coordination  Muscle weakness (generalized)     Problem List There are no active problems to display for this patient.   Ezekiel InaMansfield, Verner Mccrone S 04/05/2016, 11:37 AM  [facility] [place] [facility] [address] [place], [place], [address] Phone: [phone]   Fax:  [phone]  Name: Dalton Mathis MRN: [id number] Date of Birth: 03-19-1966

## 2016-04-07 ENCOUNTER — Ambulatory Visit: Payer: BC Managed Care – PPO | Admitting: Physical Therapy

## 2016-04-07 ENCOUNTER — Encounter: Payer: Self-pay | Admitting: Physical Therapy

## 2016-04-07 DIAGNOSIS — R278 Other lack of coordination: Secondary | ICD-10-CM

## 2016-04-07 DIAGNOSIS — M6281 Muscle weakness (generalized): Secondary | ICD-10-CM

## 2016-04-07 NOTE — Therapy (Signed)
[place] [facility] [address] [place], [place], [address] Phone: [phone]   Fax:  [phone]  Physical Therapy Treatment  Patient Details  Name: Dalton Mathis MRN: [id number] Date of Birth: 1965/07/28 Referring Provider: Merrily BrittleAUCH, KIMBERLY KARRAT  Encounter Date: 04/07/2016      PT End of Session - 04/07/16 0948    Visit Number 17   Number of Visits 25   Date for PT Re-Evaluation 04/25/16   PT Start Time 1005   PT Stop Time 1045   PT Time Calculation (min) 40 min   Equipment Utilized During Treatment Gait belt   Activity Tolerance Patient tolerated treatment well   Behavior During Therapy [facility] for tasks assessed/performed      Past Medical History:  Diagnosis Date  . Hypertension   . Stroke [facility](HCC)     History reviewed. No pertinent surgical history.  There were no vitals filed for this visit.      Subjective Assessment - 04/07/16 1005    Subjective Patient says that the top of the right ankle is sore today probably from the 2 minutes of running the other day.    How long can you stand comfortably? 1/2 hour same as before the cva   How long can you walk comfortably? 30 mintues   Patient Stated Goals for his left leg to not be numb   Pain Score 5    Pain Location Ankle   Pain Descriptors / Indicators Aching   Pain Type Acute pain   Pain Onset Yesterday   Pain Frequency Intermittent   Aggravating Factors  running on the TM     Therapeutic exercise: TM walking at 3. 0 without UE support x 10 minutes  trampoline alternating LE marching fast in parallel bars, and jumping  Floor to mat x 10 leading with left and leading with right  Matrix LE extension 17. 5 lbs x 15 x 2 , abd 15 x 2, RLE with LLE on purple pad  Patient needs occasional verbal cueing to improve posture and cueing to correctly perform exercises slowly, holding at end of range to increase motor firing of desired muscle to encourage fatigue.  Patient has some right ankle discomfort with DF during attempted running.                              PT Education - 04/07/16 1007    Education provided Yes             PT Long Term Goals - 03/16/16 0910      PT LONG TERM GOAL #1   Title Patient will increase BLE gross strength to 4+/5 as to improve functional strength for independent gait, increased standing tolerance and increased ADL ability.   Time 12   Period Weeks   Status On-going     PT LONG TERM GOAL #2   Title Patient will reduce timed up and go to <11 seconds to reduce fall risk and demonstrate improved transfer/gait ability.   Time 12   Period Weeks   Status On-going     PT LONG TERM GOAL #3   Title Patient will be independent with ascend/descend 12 steps using single UE in step over step pattern without LOB.   Time 12   Period Weeks   Status On-going     PT LONG TERM GOAL #4   Title Patient will increase six minute walk test distance to >  1200 for progression to community ambulator and improve gait ability   Time 12   Period Weeks   Status On-going               Plan - 04/07/16 1007    Clinical Impression Statement Patient is having right ankle pain that gets worse with fast TM running 4.7 m/ hour. He was seen for challenging floor to stand transfers and strengthening of RLE for stability during high level gait such as running.    Rehab Potential Good   PT Frequency 2x / week   PT Duration 12 weeks   PT Treatment/Interventions Therapeutic exercise;Balance training;Neuromuscular re-education;Therapeutic activities;Gait training;Manual techniques   PT Next Visit Plan please address goals.   PT Home Exercise Plan heel raises, marching in place   Consulted and Agree with Plan of Care Patient      Patient will benefit from skilled therapeutic intervention in order to improve the following deficits and impairments:  Decreased balance, Impaired sensation, Obesity, Decreased  activity tolerance, Decreased coordination, Decreased strength, Cardiopulmonary status limiting activity  Visit Diagnosis: Other lack of coordination  Muscle weakness (generalized)     Problem List There are no active problems to display for this patient.  Ezekiel InaKristine S Mansfield, PT, DPT [place], [place]ristine S 04/07/2016, 10:10 AM  [place] [facility] [address] [place], [place], [address] Phone: [phone]   Fax:  [phone]  Name: Dalton Mathis MRN: [id number] Date of Birth: January 24, 1966

## 2016-04-12 ENCOUNTER — Encounter: Payer: Self-pay | Admitting: Physical Therapy

## 2016-04-12 ENCOUNTER — Ambulatory Visit: Payer: BC Managed Care – PPO | Admitting: Physical Therapy

## 2016-04-12 DIAGNOSIS — M6281 Muscle weakness (generalized): Secondary | ICD-10-CM | POA: Diagnosis not present

## 2016-04-12 DIAGNOSIS — R278 Other lack of coordination: Secondary | ICD-10-CM

## 2016-04-12 NOTE — Therapy (Signed)
[facility] [address] [place], [place], [address] Phone: [phone]   [employer]:  [phone]  Physical Therapy Treatment  Patient Details  Name: Dalton Mathis MRN: [id number] Date of Birth: 08/01/1965 Referring Provider: Merrily BrittleAUCH, KIMBERLY KARRAT  Encounter Date: 04/12/2016      PT End of Session - 04/12/16 1050    Visit Number 18   Number of Visits 25   Date for PT Re-Evaluation 04/25/16   PT Start Time 1045   PT Stop Time 1130   PT Time Calculation (min) 45 min   Equipment Utilized During Treatment Gait belt   Activity Tolerance Patient tolerated treatment well   Behavior During Therapy [facility] for tasks assessed/performed      Past Medical History:  Diagnosis Date  . Hypertension   . Stroke [facility](HCC)     History reviewed. No pertinent surgical history.  There were no vitals filed for this visit.      Subjective Assessment - 04/12/16 1049    Subjective Patient says that he is doing fine. He is working out at [employer] on the days he does not come to therapy.   How long can you stand comfortably? 1/2 hour same as before the cva   How long can you walk comfortably? 30 mintues   Patient Stated Goals for his left leg to not be numb   Pain Onset Yesterday     Therapeutic exercise: TM running at 5.0 miles/hour x 1 minute, 4. 6 m/hour x 1 minute. And 4.6 m/hour x 53 seconds all with UE support on the TM Sit from mat to floor x 10  Quadriped leg extension and hold x 5 sec x 15  Reps Tall kneeling to 1/2 kneeling x 10 Matrix fwd/sideways with 17.5 lbs and step up x 5 each way Tall kneeling diagonals with orange theraball Matrix hip extension 12 lbs x 15 left and right Patient needs occasional verbal cueing to improve posture and cueing to correctly perform exercises slowly, holding at end of range to increase motor firing of desired muscle to encourage fatigue.                              PT  Education - 04/12/16 1050    Education provided Yes   Education Details safety with running using UE's   Person(s) Educated Patient   Methods Explanation   Comprehension Verbalized understanding             PT Long Term Goals - 03/16/16 0910      PT LONG TERM GOAL #1   Title Patient will increase BLE gross strength to 4+/5 as to improve functional strength for independent gait, increased standing tolerance and increased ADL ability.   Time 12   Period Weeks   Status On-going     PT LONG TERM GOAL #2   Title Patient will reduce timed up and go to <11 seconds to reduce fall risk and demonstrate improved transfer/gait ability.   Time 12   Period Weeks   Status On-going     PT LONG TERM GOAL #3   Title Patient will be independent with ascend/descend 12 steps using single UE in step over step pattern without LOB.   Time 12   Period Weeks   Status On-going     PT LONG TERM GOAL #4   Title Patient will increase six minute walk test distance to >1200 for  progression to community ambulator and improve gait ability   Time 12   Period Weeks   Status On-going               Plan - 04/12/16 1051    Clinical Impression Statement Patient is able to run for 1 minute at 5. 0 a time wiht UE use on TM and needs to rest after a minute. He continues to benefit from skilled PT to return to his PLOF.   Rehab Potential Good   PT Frequency 2x / week   PT Duration 12 weeks   PT Treatment/Interventions Therapeutic exercise;Balance training;Neuromuscular re-education;Therapeutic activities;Gait training;Manual techniques   PT Next Visit Plan please address goals.   PT Home Exercise Plan heel raises, marching in place   Consulted and Agree with Plan of Care Patient      Patient will benefit from skilled therapeutic intervention in order to improve the following deficits and impairments:  Decreased balance, Impaired sensation, Obesity, Decreased activity tolerance, Decreased  coordination, Decreased strength, Cardiopulmonary status limiting activity  Visit Diagnosis: Other lack of coordination  Muscle weakness (generalized)     Problem List There are no active problems to display for this patient.  Ezekiel InaKristine S Ramonda Galyon, PT, DPT [place], Barkley BrunsKristine S 04/12/2016, 10:54 AM  [facility] [address] [place], [place], [address] Phone: [phone]   Fax:  [phone]  Name: Dalton Mathis MRN: [id number] Date of Birth: [birth year]

## 2016-04-19 ENCOUNTER — Ambulatory Visit: Payer: BC Managed Care – PPO | Admitting: Physical Therapy

## 2016-04-19 ENCOUNTER — Encounter: Payer: Self-pay | Admitting: Physical Therapy

## 2016-04-19 DIAGNOSIS — M6281 Muscle weakness (generalized): Secondary | ICD-10-CM | POA: Diagnosis not present

## 2016-04-19 DIAGNOSIS — R278 Other lack of coordination: Secondary | ICD-10-CM

## 2016-04-19 NOTE — Therapy (Signed)
[facility] [address] [place], [place], [address] Phone: [phone]   Fax:  [phone]  Physical Therapy Treatment  Patient Details  Name: Dalton Mathis MRN: [id number] Date of Birth: January 23, 1966 Referring Provider: Merrily BrittleAUCH, KIMBERLY KARRAT  Encounter Date: 04/19/2016      PT End of Session - 04/19/16 1009    Visit Number 19   Number of Visits 25   Date for PT Re-Evaluation 04/25/16   PT Start Time 1000   PT Stop Time 1045   PT Time Calculation (min) 45 min   Equipment Utilized During Treatment Gait belt   Activity Tolerance Patient tolerated treatment well   Behavior During Therapy [facility] for tasks assessed/performed      Past Medical History:  Diagnosis Date  . Hypertension   . Stroke [facility](HCC)     History reviewed. No pertinent surgical history.  There were no vitals filed for this visit.      Subjective Assessment - 04/19/16 1008    Subjective Patient says that he is doing fine. He thinks that his left ankle swells after he runs on the TM.   How long can you stand comfortably? 1/2 hour same as before the cva   How long can you walk comfortably? 30 mintues   Patient Stated Goals for his left leg to not be numb   Currently in Pain? No/denies   Pain Score 0-No pain   Pain Onset Yesterday   Multiple Pain Sites No     Therapeutic exercise; TM walking at 3.0 x 10 minutes with elevation 1 Jogging without UE support x 2 minutes without ankle pain.  Quadriped leg extension alternating UE and opposite LE and 5 sec hold Tall kneeling to 1/2 kneeling x 10  Matrix with 12.5 lbs hip abd, hip extension x 15 x 2 left and right BOSU ball squats x 10  Patient needs occasional verbal cueing to improve posture and cueing to correctly perform exercises slowly, holding at end of range to increase motor firing of desired muscle to encourage fatigue.                               PT Education -  04/19/16 1009    Education provided Yes   Education Details safety with slow run   Person(s) Educated Patient   Methods Explanation   Comprehension Verbalized understanding             PT Long Term Goals - 04/19/16 1002      PT LONG TERM GOAL #5   Title Patient will be able to run for 5 minutes at a slow jog without ankle swelling following the run.    Time 8   Status New               Plan - 04/19/16 1009    Clinical Impression Statement Patient is able to run a slow paced jog for 2 minutes wihtout ankle discomfort and without knee buckling.    Rehab Potential Good   PT Frequency 2x / week   PT Duration 12 weeks   PT Treatment/Interventions Therapeutic exercise;Balance training;Neuromuscular re-education;Therapeutic activities;Gait training;Manual techniques   PT Next Visit Plan please address goals.   PT Home Exercise Plan heel raises, marching in place   Consulted and Agree with Plan of Care Patient      Patient will benefit from skilled therapeutic intervention in order to improve the  following deficits and impairments:  Decreased balance, Impaired sensation, Obesity, Decreased activity tolerance, Decreased coordination, Decreased strength, Cardiopulmonary status limiting activity  Visit Diagnosis: Other lack of coordination  Muscle weakness (generalized)     Problem List There are no active problems to display for this patient.  Ezekiel InaKristine S Hendrix Console, PT, DPT [place], Barkley BrunsKristine S 04/19/2016, 10:11 AM  [place] [facility] MAIN [facility] [address] [place], [place], [address] Phone: [phone]   Fax:  [phone]  Name: Dalton Mathis MRN: [id number] Date of Birth: Dec 22, 1965

## 2016-04-20 ENCOUNTER — Encounter: Payer: BC Managed Care – PPO | Admitting: Physical Therapy

## 2016-04-21 ENCOUNTER — Ambulatory Visit: Payer: BC Managed Care – PPO | Admitting: Physical Therapy

## 2016-04-21 ENCOUNTER — Encounter: Payer: Self-pay | Admitting: Physical Therapy

## 2016-04-21 DIAGNOSIS — M6281 Muscle weakness (generalized): Secondary | ICD-10-CM

## 2016-04-21 DIAGNOSIS — R278 Other lack of coordination: Secondary | ICD-10-CM

## 2016-04-21 NOTE — Therapy (Signed)
[place] MAIN [facility] SERVICES [address] [place], [place], [address] Phone: [phone]   Fax:  [phone]  Physical Therapy Treatment  Patient Details  Name: Dalton Mathis MRN: [id number] Date of Birth: [birth year] Referring Provider: Leretha Dykes  Encounter Date: 04/21/2016      PT End of Session - 04/21/16 0954    Visit Number 20   Number of Visits 25   Date for PT Re-Evaluation 04/25/16   PT Start Time 1000   PT Stop Time 1045   PT Time Calculation (min) 45 min   Equipment Utilized During Treatment Gait belt   Activity Tolerance Patient tolerated treatment well   Behavior During Therapy [facility] for tasks assessed/performed      Past Medical History:  Diagnosis Date  . Hypertension   . Stroke [facility])     History reviewed. No pertinent surgical history.  There were no vitals filed for this visit.   OUTCOME MEASURES: TEST Outcome Interpretation  5 times sit<>stand 13.60 sec >60 yo, >15 sec indicates increased risk for falls  10 meter walk test             1.63 m/s <1.0 m/s indicates increased risk for falls; limited community ambulator  Timed up and Go   8.30             sec <14 sec indicates increased risk for falls  6 minute walk test 1550            Feet 1000 feet is community ambulator             Running for 1 1/2 minutes and 1 min in the hall with fatigue Quadriped alternating UE and LE with 5 sec hold x 10  Tall kneeling to 1/2 kneeling x 10  Tall kneeling to standing x 10  Matrix 12. 5 lbs hip extension and hip abd x 20 left and right Reviewed HEP for ankle eversion using BTB  Patient needs occasional verbal cueing to improve posture and cueing to correctly perform exercises slowly, holding at end of range to increase motor firing of desired muscle to encourage fatigue.                             PT Education - 04/21/16 0954    Education provided Yes   Education Details  safety with running slow   Person(s) Educated Patient   Methods Explanation   Comprehension Verbalized understanding             PT Long Term Goals - 04/21/16 1031      PT LONG TERM GOAL #1   Title Patient will increase BLE gross strength to 4+/5 as to improve functional strength for independent gait, increased standing tolerance and increased ADL ability.   Time 12   Period Weeks   Status On-going     PT LONG TERM GOAL #2   Title Patient will reduce timed up and go to <11 seconds to reduce fall risk and demonstrate improved transfer/gait ability.   Time 12   Period Weeks   Status On-going     PT LONG TERM GOAL #3   Title Patient will be independent with ascend/descend 12 steps using single UE in step over step pattern without LOB.   Time 12   Period Weeks   Status On-going     PT LONG TERM GOAL #4   Title Patient will increase six minute  walk test distance to >1200 for progression to community ambulator and improve gait ability   Time 12   Status Achieved     PT LONG TERM GOAL #5   Title Patient will be able to run for 5 minutes at a slow jog without ankle swelling following the run.    Time 12   Period Weeks   Status Partially Met               Plan - 04/21/16 0954    Clinical Impression Statement Patient performed outcome measures today with improving tests. Patient continues to benefit from skilled PT to continue to strengthen ankle and knee on left side.      Patient will benefit from skilled therapeutic intervention in order to improve the following deficits and impairments:     Visit Diagnosis: No diagnosis found.     Problem List There are no active problems to display for this patient.  Alanson Puls, PT, DPT [place], Minette Headland S 04/21/2016, 10:32 AM  [place] MAIN [facility] SERVICES [address] [place], [place], [address] Phone: [phone]   Fax:  [phone]  Name: Yuepheng Schaller MRN: [id number] Date of Birth: 03-02-1966

## 2016-04-25 ENCOUNTER — Ambulatory Visit: Payer: BC Managed Care – PPO | Attending: Physical Medicine and Rehabilitation | Admitting: Physical Therapy

## 2016-04-25 ENCOUNTER — Encounter: Payer: Self-pay | Admitting: Physical Therapy

## 2016-04-25 DIAGNOSIS — M6281 Muscle weakness (generalized): Secondary | ICD-10-CM | POA: Insufficient documentation

## 2016-04-25 DIAGNOSIS — R278 Other lack of coordination: Secondary | ICD-10-CM

## 2016-04-25 NOTE — Therapy (Signed)
[place] [facility] [address] [place], [place], [address] Phone: [phone]   Fax:  [phone]  Physical Therapy Treatment  Patient Details  Name: Dalton Mathis MRN: [id number] Date of Birth: 1965/09/15 Referring Provider: Merrily BrittleAUCH, KIMBERLY KARRAT  Encounter Date: 04/25/2016      PT End of Session - 04/25/16 1148    Visit Number 21   Number of Visits 25   Date for PT Re-Evaluation 04/25/16   PT Start Time 1145   PT Stop Time 1230   PT Time Calculation (min) 45 min   Equipment Utilized During Treatment Gait belt   Activity Tolerance Patient tolerated treatment well   Behavior During Therapy [facility] for tasks assessed/performed      Past Medical History:  Diagnosis Date  . Hypertension   . Stroke [facility](HCC)     History reviewed. No pertinent surgical history.  There were no vitals filed for this visit.      Subjective Assessment - 04/25/16 1147    Subjective Patient says that he is doing fine. He is working at [employer] on his opposite therapy days.   How long can you stand comfortably? 1/2 hour same as before the cva   How long can you walk comfortably? 30 mintues   Patient Stated Goals for his left leg to not be numb   Currently in Pain? No/denies   Pain Score 0-No pain   Pain Onset Yesterday   Multiple Pain Sites No      Therapeutic exercise; Planks prone 30 sec x 3, sidelying 1 min x 3 Quadriped alternating UE and LE x 10 x 5 sec hold Prone hip extension x 10 with 5 sec hold Standing on BOSU ball squats x 10 x 2 Min cueing needed to appropriately perform strengthening tasks with leg, hand, and head position.                            PT Education - 04/25/16 1147    Education provided Yes   Education Details HEP review   Person(s) Educated Patient   Methods Explanation   Comprehension Verbalized understanding             PT Long Term Goals - 04/25/16 1150      PT LONG TERM  GOAL #1   Title Patient will increase BLE gross strength to 4+/5 as to improve functional strength for independent gait, increased standing tolerance and increased ADL ability.   Time 12   Period Weeks   Status On-going     PT LONG TERM GOAL #2   Title Patient will reduce timed up and go to <11 seconds to reduce fall risk and demonstrate improved transfer/gait ability.   Time 12   Period Weeks   Status On-going     PT LONG TERM GOAL #3   Title Patient will be independent with ascend/descend 12 steps using single UE in step over step pattern without LOB.   Time 12   Period Weeks   Status On-going     PT LONG TERM GOAL #4   Title Patient will increase six minute walk test distance to >1200 for progression to community ambulator and improve gait ability   Time 12   Status Achieved     PT LONG TERM GOAL #5   Title Patient will be able to run for 5 minutes at a slow jog without ankle swelling following the run.  Time 12   Status On-going               Plan - 04/25/16 1148    Clinical Impression Statement Continued to work on hip and quad strengthening for greater L knee stability.  He would benefit from further skilled PT to increase his dynamic balance and LE strength for more functional mobility.   Rehab Potential Good   PT Frequency 2x / week   PT Duration 12 weeks   PT Treatment/Interventions Therapeutic exercise;Balance training;Neuromuscular re-education;Therapeutic activities;Gait training;Manual techniques   Consulted and Agree with Plan of Care Patient      Patient will benefit from skilled therapeutic intervention in order to improve the following deficits and impairments:  Decreased balance, Impaired sensation, Obesity, Decreased activity tolerance, Decreased coordination, Decreased strength, Cardiopulmonary status limiting activity  Visit Diagnosis: Other lack of coordination  Muscle weakness (generalized)     Problem List There are no active  problems to display for this patient. Ezekiel InaKristine S Meghna Hagmann, PT, DPT  [place], Barkley BrunsKristine S 04/25/2016, 12:07 PM  [place] [facility] [address] [place], [place], [address] Phone: [phone]   Fax:  [phone]  Name: Dalton Mathis MRN: [id number] Date of Birth: 1965-07-06

## 2016-04-27 ENCOUNTER — Encounter: Payer: Self-pay | Admitting: Physical Therapy

## 2016-04-27 ENCOUNTER — Ambulatory Visit: Payer: BC Managed Care – PPO | Admitting: Physical Therapy

## 2016-04-27 DIAGNOSIS — R278 Other lack of coordination: Secondary | ICD-10-CM

## 2016-04-27 DIAGNOSIS — M6281 Muscle weakness (generalized): Secondary | ICD-10-CM

## 2016-04-27 NOTE — Therapy (Signed)
[facility] [address] [place], [place], [address] Phone: [phone]   Fax:  [phone]  Physical Therapy Treatment  Patient Details  Name: Dalton Mathis MRN: [id number] Date of Birth: 24-Sep-1965 Referring Provider: Merrily BrittleAUCH, KIMBERLY KARRAT  Encounter Date: 04/27/2016      PT End of Session - 04/27/16 1151    Visit Number 22   Number of Visits 25   Date for PT Re-Evaluation 04/25/16   PT Start Time 1145   PT Stop Time 1230   PT Time Calculation (min) 45 min   Equipment Utilized During Treatment Gait belt   Activity Tolerance Patient tolerated treatment well   Behavior During Therapy [facility] for tasks assessed/performed      Past Medical History:  Diagnosis Date  . Hypertension   . Stroke [facility](HCC)     History reviewed. No pertinent surgical history.  There were no vitals filed for this visit.      Subjective Assessment - 04/27/16 1150    Subjective Patient reports no changes and he continues to work out in the gym on his non therapy days.   Currently in Pain? No/denies   Pain Score 0-No pain        Therapeutic exercise: LLE single leg balance activites with trampoline, disc , and 1/2 foam Quadriped alternating UE and LE with extension and holding x 5 sec x 10 Step ups to BOSU ball leading with LLE and descending with RLE  BAPS board CCW, CW, x 20 Matrix step ups and step down off 6 inch stool x 5 Leg press 120 x 20 x 2 LLE only Knee squeeze standing with theraball x 20 Patient has decreased L knee control with stability during high level balance activities.                          PT Education - 04/27/16 1150    Education provided Yes   Education Details HEP   Person(s) Educated Patient   Methods Explanation   Comprehension Verbalized understanding             PT Long Term Goals - 04/25/16 1150      PT LONG TERM GOAL #1   Title Patient will increase BLE gross  strength to 4+/5 as to improve functional strength for independent gait, increased standing tolerance and increased ADL ability.   Time 12   Period Weeks   Status On-going     PT LONG TERM GOAL #2   Title Patient will reduce timed up and go to <11 seconds to reduce fall risk and demonstrate improved transfer/gait ability.   Time 12   Period Weeks   Status On-going     PT LONG TERM GOAL #3   Title Patient will be independent with ascend/descend 12 steps using single UE in step over step pattern without LOB.   Time 12   Period Weeks   Status On-going     PT LONG TERM GOAL #4   Title Patient will increase six minute walk test distance to >1200 for progression to community ambulator and improve gait ability   Time 12   Status Achieved     PT LONG TERM GOAL #5   Title Patient will be able to run for 5 minutes at a slow jog without ankle swelling following the run.    Time 12   Status On-going  Plan - 04/27/16 1151    Clinical Impression Statement Patient has weakness in left ankle and left knee and continues to have decreased stability in LLE and difficulty with running, quick movements and dynamic standing balance.    Rehab Potential Good   PT Frequency 2x / week   PT Duration 12 weeks   PT Treatment/Interventions Therapeutic exercise;Balance training;Neuromuscular re-education;Therapeutic activities;Gait training;Manual techniques   Consulted and Agree with Plan of Care Patient      Patient will benefit from skilled therapeutic intervention in order to improve the following deficits and impairments:  Decreased balance, Impaired sensation, Obesity, Decreased activity tolerance, Decreased coordination, Decreased strength, Cardiopulmonary status limiting activity  Visit Diagnosis: Other lack of coordination  Muscle weakness (generalized)     Problem List There are no active problems to display for this patient.  Ezekiel InaKristine S [place], PT,  DPT [place] [address] [place], Barkley BrunsKristine S 04/27/2016, 11:54 AM  [place] [facility] [address] [place], [place], [address] Phone: [phone]   Fax:  [phone]  Name: Dalton Mathis MRN: [id number] Date of Birth: [birth year]

## 2016-04-28 NOTE — Addendum Note (Signed)
Addended by: Ezekiel InaMANSFIELD, Prosperity Darrough S on: 04/28/2016 03:05 PM   Modules accepted: Orders

## 2016-05-03 ENCOUNTER — Ambulatory Visit: Payer: BC Managed Care – PPO | Admitting: Physical Therapy

## 2016-05-05 ENCOUNTER — Ambulatory Visit: Payer: BC Managed Care – PPO | Admitting: Physical Therapy

## 2016-05-05 ENCOUNTER — Encounter: Payer: Self-pay | Admitting: Physical Therapy

## 2016-05-05 DIAGNOSIS — M6281 Muscle weakness (generalized): Secondary | ICD-10-CM

## 2016-05-05 DIAGNOSIS — R278 Other lack of coordination: Secondary | ICD-10-CM | POA: Diagnosis not present

## 2016-05-05 NOTE — Patient Instructions (Addendum)
Bound: Lateral   Start beside incline. Jump, pushing up and out to side, gaining distance and height. Land on uphill foot only. Push off immediately and repeat downhill, landing on downhill foot only. Repeat _5-10__ times. Repeat on other side for set. Rest _2__ seconds after set. Do _1__ sets per session.  http://plyo.exer.us/93   Marching In-Place    Stand straight, do 3 steps and hold (right, left, right, hold);  Do __10_ reps. Do _2__ times per day.  Copyright  VHI. All rights reserved.  Heel Raise: Unilateral (Standing)    Balance on left foot, then rise on ball of foot. Repeat 10____ times per set. Do __2__ sets per session. Do ___2_ sessions per day.  http://orth.exer.us/40   Copyright  VHI. All rights reserved.

## 2016-05-05 NOTE — Therapy (Signed)
[place] [facility] [address] [place], [place], [address] Phone: [phone]   Fax:  [phone]  Physical Therapy Treatment  Patient Details  Name: Dalton Mathis MRN: [id number] Date of Birth: [birth year] Referring Provider: Merrily BrittleAUCH, KIMBERLY KARRAT  Encounter Date: 05/05/2016      PT End of Session - 05/05/16 1606    Visit Number 23   Number of Visits 49   Date for PT Re-Evaluation 06/19/16   PT Start Time 1600   PT Stop Time 1645   PT Time Calculation (min) 45 min   Equipment Utilized During Treatment Gait belt   Activity Tolerance Patient tolerated treatment well   Behavior During Therapy [facility] for tasks assessed/performed      Past Medical History:  Diagnosis Date  . Hypertension   . Stroke [facility](HCC)     History reviewed. No pertinent surgical history.  There were no vitals filed for this visit.      Subjective Assessment - 05/05/16 1604    Subjective Patient reports doing well, He reports being back to baseline for running. He reports still having trouble with some dynamic tasks and is not quite back to baseline. Patient denies any pain; reports compliance with HEP;    How long can you stand comfortably? 1/2 hour same as before the cva   How long can you walk comfortably? 30 mintues   Patient Stated Goals for his left leg to not be numb   Currently in Pain? No/denies         TREATMENT:  Qped: Pball roll outs x10 with min VCs to increase forward lean on UE for increased core and UE strengthening; Alternate UE/LE lift x10 each side; Tall kneeling on BOSU with ball toss x10, required min VCs to increase hip extension for better trunk control and balance;  Full squat with jump using TRX strap 2x10; required close supervision and min Vcs to improve weight bearing in BLE;  Quick toe taps on 2 inch step x2 min with cues for sequencing and to improve hop for quick steps;  Standing on trampoline: Gentle bounce  x1 min; Alternate march x10 reps with cues for increased hip flexion for high knee march; 3 quick steps and SLS x10 each LE with min A for balance and mod Vcs for sequencing;  Standing on firm surface: Lateral jumps, SLS x10 each direction; 3 quick steps, SLS x5 reps  Single leg heel raises x10 bilaterally;  Patient required min-moderate verbal/tactile cues for correct exercise technique.                          PT Education - 05/05/16 1606    Education provided Yes   Education Details strengthening/dynamic balance;    Person(s) Educated Patient   Methods Explanation;Verbal cues;Tactile cues   Comprehension Verbalized understanding;Verbal cues required;Returned demonstration             PT Long Term Goals - 04/25/16 1150      PT LONG TERM GOAL #1   Title Patient will increase BLE gross strength to 4+/5 as to improve functional strength for independent gait, increased standing tolerance and increased ADL ability.   Time 12   Period Weeks   Status On-going     PT LONG TERM GOAL #2   Title Patient will reduce timed up and go to <11 seconds to reduce fall risk and demonstrate improved transfer/gait ability.   Time 12   Period Weeks  Status On-going     PT LONG TERM GOAL #3   Title Patient will be independent with ascend/descend 12 steps using single UE in step over step pattern without LOB.   Time 12   Period Weeks   Status On-going     PT LONG TERM GOAL #4   Title Patient will increase six minute walk test distance to >1200 for progression to community ambulator and improve gait ability   Time 12   Status Achieved     PT LONG TERM GOAL #5   Title Patient will be able to run for 5 minutes at a slow jog without ankle swelling following the run.    Time 12   Status On-going               Plan - 05/05/16 1645    Clinical Impression Statement Patient instructed in advanced LE strengthening; patient required min-mod VCs for correct  positioning; He had increased difficulty with dynamic steps and quick movements especially unsupported; Patient would benefit from additional skilled PT intervention to improve strength, balance and return to PLOF.    Rehab Potential Good   PT Frequency 2x / week   PT Duration 12 weeks   PT Treatment/Interventions Therapeutic exercise;Balance training;Neuromuscular re-education;Therapeutic activities;Gait training;Manual techniques   Consulted and Agree with Plan of Care Patient      Patient will benefit from skilled therapeutic intervention in order to improve the following deficits and impairments:  Decreased balance, Impaired sensation, Obesity, Decreased activity tolerance, Decreased coordination, Decreased strength, Cardiopulmonary status limiting activity  Visit Diagnosis: Other lack of coordination  Muscle weakness (generalized)     Problem List There are no active problems to display for this patient.   Ariyanah Aguado PT, DPT 05/05/2016, 4:47 PM  [place] [facility] [address] [place], [place], [address] Phone: [phone]   Fax:  [phone]  Name: Dalton Mathis MRN: [id number] Date of Birth: [birth year]

## 2016-05-10 ENCOUNTER — Encounter: Payer: Self-pay | Admitting: Physical Therapy

## 2016-05-10 ENCOUNTER — Ambulatory Visit: Payer: BC Managed Care – PPO

## 2016-05-10 DIAGNOSIS — R278 Other lack of coordination: Secondary | ICD-10-CM | POA: Diagnosis not present

## 2016-05-10 DIAGNOSIS — M6281 Muscle weakness (generalized): Secondary | ICD-10-CM

## 2016-05-10 NOTE — Therapy (Signed)
[place] [facility] [address] [place], [place], [address] Phone: [phone]   Fax:  [phone]  Physical Therapy Treatment  Patient Details  Name: Dalton Mathis MRN: [id number] Date of Birth: Jan 10, 1966 Referring Provider: Merrily BrittleAUCH, KIMBERLY KARRAT  Encounter Date: 05/10/2016      PT End of Session - 05/10/16 1442    Visit Number 24   Number of Visits 49   Date for PT Re-Evaluation 06/19/16   PT Start Time 1435   PT Stop Time 1520   PT Time Calculation (min) 45 min   Equipment Utilized During Treatment Gait belt   Activity Tolerance Patient tolerated treatment well   Behavior During Therapy [facility] for tasks assessed/performed      Past Medical History:  Diagnosis Date  . Hypertension   . Stroke [facility](HCC)     History reviewed. No pertinent surgical history.  There were no vitals filed for this visit.      Subjective Assessment - 05/10/16 1440    Subjective Pt reports he is doing well on this date. No recent changes in health. He saw his MD on 05/05/16 without any changes to medications or health. [facility]. No specific questions or concerns at this time.    How long can you stand comfortably? 1/2 hour same as before the cva   How long can you walk comfortably? 30 mintues   Patient Stated Goals for his left leg to not be numb   Currently in Pain? No/denies          TREATMENT:  Jump squats using TRX straps 2 x 10; required close supervision and min VCs to improve weight bearing in BLE; LLE single leg TRX squats 2 x 10; Half-kneeling with L knee on BOSU 6# overhead ball bounces on wall 2 x 20; Forward BOSU lunges leading with LLE 2 x 10; Alternating quick toe taps on 6" step 1 min x 2 with cues for sequencing, difficulty hopping; HOIST L knee extension plate 3 (32#32#) x 10, plate 4 (95#43#) 2 x 10; HOIST L knee HS curls plate 4 (18#43#) x 10, plate 5 (84#54#) x  10; TRX lateral jumps 1 minute x 2;  Patient required min-moderate verbal/tactile cues for correct exercise technique.                       PT Education - 05/10/16 1442    Education provided Yes   Education Details Reinforced HEP   Person(s) Educated Patient   Methods Explanation   Comprehension Verbalized understanding             PT Long Term Goals - 04/25/16 1150      PT LONG TERM GOAL #1   Title Patient will increase BLE gross strength to 4+/5 as to improve functional strength for independent gait, increased standing tolerance and increased ADL ability.   Time 12   Period Weeks   Status On-going     PT LONG TERM GOAL #2   Title Patient will reduce timed up and go to <11 seconds to reduce fall risk and demonstrate improved transfer/gait ability.   Time 12   Period Weeks   Status On-going     PT LONG TERM GOAL #3   Title Patient will be independent with ascend/descend 12 steps using single UE in step over step pattern without LOB.   Time 12   Period Weeks  Status On-going     PT LONG TERM GOAL #4   Title Patient will increase six minute walk test distance to >1200 for progression to community ambulator and improve gait ability   Time 12   Status Achieved     PT LONG TERM GOAL #5   Title Patient will be able to run for 5 minutes at a slow jog without ankle swelling following the run.    Time 12   Status On-going               Plan - 05/10/16 1442    Clinical Impression Statement Pt continues to demonstrate difficulty with single leg balance and stability during session today. Difficulty with L quad eccentric control during TRX single leg squats. Pt encouraged to encorporate dynamic exercises into his routine at [employer]. Continue HEP and follow-up as scheduled.    Rehab Potential Good   PT Frequency 2x / week   PT Duration 12 weeks   PT Treatment/Interventions Therapeutic exercise;Balance training;Neuromuscular  re-education;Therapeutic activities;Gait training;Manual techniques   PT Next Visit Plan Continue strength and balance   PT Home Exercise Plan Continue as prescribed   Consulted and Agree with Plan of Care Patient      Patient will benefit from skilled therapeutic intervention in order to improve the following deficits and impairments:  Decreased balance, Impaired sensation, Obesity, Decreased activity tolerance, Decreased coordination, Decreased strength, Cardiopulmonary status limiting activity  Visit Diagnosis: Other lack of coordination  Muscle weakness (generalized)     Problem List There are no active problems to display for this patient.  Lynnea MaizesJason D Katelynne Revak PT, DPT   Que Meneely 05/10/2016, 4:36 PM  [place] [facility] [address] [place], [place], [address] Phone: [phone]   Fax:  [phone]  Name: Dalton Mathis MRN: [id number] Date of Birth: [birth year]

## 2016-05-12 ENCOUNTER — Ambulatory Visit: Payer: BC Managed Care – PPO | Admitting: Physical Therapy

## 2016-05-12 ENCOUNTER — Encounter: Payer: Self-pay | Admitting: Physical Therapy

## 2016-05-12 DIAGNOSIS — R278 Other lack of coordination: Secondary | ICD-10-CM | POA: Diagnosis not present

## 2016-05-12 DIAGNOSIS — M6281 Muscle weakness (generalized): Secondary | ICD-10-CM

## 2016-05-12 NOTE — Therapy (Signed)
[place] [facility] [address] [place], [place], [address] Phone: [phone]   Fax:  [phone]  Physical Therapy Treatment  Patient Details  Name: Dalton Mathis MRN: [id number] Date of Birth: 1965-09-08 Referring Provider: Merrily BrittleAUCH, KIMBERLY KARRAT  Encounter Date: 05/12/2016      PT End of Session - 05/12/16 1026    Visit Number 25   Number of Visits 49   Date for PT Re-Evaluation 06/19/16   Equipment Utilized During Treatment Gait belt   Activity Tolerance Patient tolerated treatment well   Behavior During Therapy [facility] for tasks assessed/performed      Past Medical History:  Diagnosis Date  . Hypertension   . Stroke [facility](HCC)     History reviewed. No pertinent surgical history.  There were no vitals filed for this visit.      Subjective Assessment - 05/12/16 1025    Subjective Pt reports he is doing well on this date. No recent changes in health. [facility]. No specific questions or concerns at this time.    How long can you stand comfortably? 1/2 hour same as before the cva   How long can you walk comfortably? 30 mintues   Patient Stated Goals for his left leg to not be numb   Currently in Pain? No/denies   Pain Score 0-No pain   Pain Onset Yesterday   Multiple Pain Sites No      Therapeutic exercises: Trampoline marching x 3 minutes as fast as he can 1/2 foam tandem stand x 3 minutes 1/2 foam and disc stand x 3 minutes Leg press 130 lbs x 20 x 2, heel raises 20 x 2 Quadriped AI with UE and LE in extension x 10 with 5 sec hold Matrix LE extension and abd with 12 lbs x 15 x 2 Walking TM elevation 6 and 2.0 x 10 mins  Patient needs occasional verbal cueing to improve posture and cueing to correctly perform exercises slowly, holding at end of range to increase motor firing of desired muscle to encourage fatigue.                             PT Education - 05/12/16 1026    Education provided Yes   Education Details LE strengthening   Person(s) Educated Patient   Methods Explanation   Comprehension Verbalized understanding             PT Long Term Goals - 04/25/16 1150      PT LONG TERM GOAL #1   Title Patient will increase BLE gross strength to 4+/5 as to improve functional strength for independent gait, increased standing tolerance and increased ADL ability.   Time 12   Period Weeks   Status On-going     PT LONG TERM GOAL #2   Title Patient will reduce timed up and go to <11 seconds to reduce fall risk and demonstrate improved transfer/gait ability.   Time 12   Period Weeks   Status On-going     PT LONG TERM GOAL #3   Title Patient will be independent with ascend/descend 12 steps using single UE in step over step pattern without LOB.   Time 12   Period Weeks   Status On-going     PT LONG TERM GOAL #4   Title Patient will increase six minute walk test distance to >1200 for progression  to community ambulator and improve gait ability   Time 12   Status Achieved     PT LONG TERM GOAL #5   Title Patient will be able to run for 5 minutes at a slow jog without ankle swelling following the run.    Time 12   Status On-going               Plan - 05/12/16 1027    Clinical Impression Statement Patient continues to have R ankle weakness and instability with uneven surfaces and single leg activities.    Rehab Potential Good   PT Frequency 2x / week   PT Duration 12 weeks   PT Treatment/Interventions Therapeutic exercise;Balance training;Neuromuscular re-education;Therapeutic activities;Gait training;Manual techniques   PT Next Visit Plan Continue strength and balance   PT Home Exercise Plan Continue as prescribed   Consulted and Agree with Plan of Care Patient      Patient will benefit from skilled therapeutic intervention in order to improve the  following deficits and impairments:  Decreased balance, Impaired sensation, Obesity, Decreased activity tolerance, Decreased coordination, Decreased strength, Cardiopulmonary status limiting activity  Visit Diagnosis: Other lack of coordination  Muscle weakness (generalized)     Problem List There are no active problems to display for this patient.  Ezekiel InaKristine S Mansfield, PT, DPT [place], Barkley BrunsKristine S 05/12/2016, 10:28 AM  [place] [facility] [address] [place], [place], [address] Phone: [phone]   Fax:  [phone]  Name: Dalton Mathis MRN: [id number] Date of Birth: 17-Oct-1965

## 2016-05-18 ENCOUNTER — Ambulatory Visit: Payer: BC Managed Care – PPO

## 2016-05-18 DIAGNOSIS — R278 Other lack of coordination: Secondary | ICD-10-CM

## 2016-05-18 DIAGNOSIS — M6281 Muscle weakness (generalized): Secondary | ICD-10-CM

## 2016-05-18 NOTE — Therapy (Signed)
[place] [facility] [address] [place], [place], [address] Phone: [phone]   Fax:  [phone]  Physical Therapy Treatment  Patient Details  Name: Dalton Mathis MRN: [id number] Date of Birth: 1965-11-04 Referring Provider: Merrily BrittleAUCH, KIMBERLY KARRAT  Encounter Date: 05/18/2016      PT End of Session - 05/18/16 1022    Visit Number 26   Number of Visits 49   Date for PT Re-Evaluation 06/19/16   PT Start Time 1015   PT Stop Time 1105   PT Time Calculation (min) 50 min   Equipment Utilized During Treatment Gait belt   Activity Tolerance Patient tolerated treatment well   Behavior During Therapy [facility] for tasks assessed/performed      Past Medical History:  Diagnosis Date  . Hypertension   . Stroke [facility](HCC)     History reviewed. No pertinent surgical history.  There were no vitals filed for this visit.      Subjective Assessment - 05/18/16 1021    Subjective Pt states he is doing well. HEP is going well without any questions/concerns. No issues on this date.    How long can you stand comfortably? 1/2 hour same as before the cva   How long can you walk comfortably? 30 mintues   Patient Stated Goals for his left leg to not be numb   Currently in Pain? No/denies         TREATMENT: Ther-ex NuStep L3 x 5 minutes for warm-up during history (4 minutes unbilled); Jump squats using TRX straps 2 x 10; required close supervision and min VCs to improve weight bearing in BLE; LLE single leg TRX squats 2 x 10; Half-kneeling with L knee on BOSU 6# overhead ball bounces on wall 30 seconds x 2; Half-kneeling with L knee on BOSU 6# lateral ball bounces on wall 30 seconds x 2; Forward BOSU lunges leading with LLE 2 x 10; Lateral BOSU lunges toward L side only 2 x 10; TRX lateral jumps 1 minute x 2;  Neuromuscular Re-education Trampoline jump marches x 1 minutes as fast as he is able; L single leg balance on trampoline with  2kg ball tosses with therapist 30 seconds x 2; 1/2 foam tandem stance alternating LE 1 min x 2 each; Rockerboard squats A/P and R/L x 10 each;  Patient required min-moderate verbal/tactile cues for correct exercise technique.                        PT Education - 05/18/16 1021    Education provided Yes   Education Details Form/technique correction during session   Person(s) Educated Patient   Methods Explanation   Comprehension Verbalized understanding             PT Long Term Goals - 04/25/16 1150      PT LONG TERM GOAL #1   Title Patient will increase BLE gross strength to 4+/5 as to improve functional strength for independent gait, increased standing tolerance and increased ADL ability.   Time 12   Period Weeks   Status On-going     PT LONG TERM GOAL #2   Title Patient will reduce timed up and go to <11 seconds to reduce fall risk and demonstrate improved transfer/gait ability.   Time 12   Period Weeks   Status On-going     PT LONG TERM GOAL #3   Title Patient will be independent with ascend/descend 12 steps using single UE in step  over step pattern without LOB.   Time 12   Period Weeks   Status On-going     PT LONG TERM GOAL #4   Title Patient will increase six minute walk test distance to >1200 for progression to community ambulator and improve gait ability   Time 12   Status Achieved     PT LONG TERM GOAL #5   Title Patient will be able to run for 5 minutes at a slow jog without ankle swelling following the run.    Time 12   Status On-going               Plan - 05/18/16 1022    Clinical Impression Statement Pt demonstrates improving jump squats with increased push off with LLE noted. Increased height of jumps observed today. Pt continues to demonstrate LLE instability with TRX lateral jumps. Cues provided to try to stick landing and hold on LLE. Pt encouraged to continue HEP. Structure exercises at home to simulate wrestling  conditions such as explosion/power specific exercises.    Rehab Potential Good   PT Frequency 2x / week   PT Duration 12 weeks   PT Treatment/Interventions Therapeutic exercise;Balance training;Neuromuscular re-education;Therapeutic activities;Gait training;Manual techniques   PT Next Visit Plan Continue strength and balance   PT Home Exercise Plan Continue as prescribed   Consulted and Agree with Plan of Care Patient      Patient will benefit from skilled therapeutic intervention in order to improve the following deficits and impairments:  Decreased balance, Impaired sensation, Obesity, Decreased activity tolerance, Decreased coordination, Decreased strength, Cardiopulmonary status limiting activity  Visit Diagnosis: Other lack of coordination  Muscle weakness (generalized)     Problem List There are no active problems to display for this patient.  Lynnea MaizesJason D Aliyha Fornes PT, DPT   Nathan Stallworth 05/18/2016, 11:21 AM  [place] [facility] [address] [place], [place], [address] Phone: [phone]   Fax:  [phone]  Name: Dalton Mathis MRN: [id number] Date of Birth: [birth year]

## 2016-05-19 ENCOUNTER — Ambulatory Visit: Payer: BC Managed Care – PPO

## 2016-05-19 DIAGNOSIS — R278 Other lack of coordination: Secondary | ICD-10-CM | POA: Diagnosis not present

## 2016-05-19 DIAGNOSIS — M6281 Muscle weakness (generalized): Secondary | ICD-10-CM

## 2016-05-19 NOTE — Therapy (Signed)
[place] [facility] [address] [place], [place], [address] Phone: [phone]   Fax:  [phone]  Physical Therapy Treatment  Patient Details  Name: Dalton Mathis MRN: [id number] Date of Birth: 07/16/1965 Referring Provider: Merrily BrittleAUCH, KIMBERLY KARRAT  Encounter Date: 05/19/2016      PT End of Session - 05/19/16 1312    Visit Number 27   Number of Visits 49   Date for PT Re-Evaluation 06/19/16   PT Start Time 1050   PT Stop Time 1140   PT Time Calculation (min) 50 min   Equipment Utilized During Treatment Gait belt   Activity Tolerance Patient tolerated treatment well   Behavior [facility] Therapy [facility] for tasks assessed/performed      Past Medical History:  Diagnosis Date  . Hypertension   . Stroke [facility](HCC)     History reviewed. No pertinent surgical history.  There were no vitals filed for this visit.      Subjective Assessment - 05/19/16 1311    Subjective No questions or concerns on this date. Denies pain or soreness following therapy session yesterday.    How long can you stand comfortably? 1/2 hour same as before the cva   How long can you walk comfortably? 30 mintues   Patient Stated Goals for his left leg to not be numb   Currently in Pain? No/denies         TREATMENT:  Ther-ex Inline lunge with LLE forward 2 x 10 in // bars and Airex under R knee; Rapid alternating jumping toe taps on 6" step without UE support 1 minute x 2; 3" aerobic step box jumps 2 x 10; Quick star taps with both LEs to improve speed and reaction time 30 seconds x 2 each; Rapid side shuffling in athletic crouched position in // bars 45 seconds x 2; Quantum LLE single leg press 120# x 10, 150# x 10, 165# x 10; Single LE on Airex, CW and CCW circles with contralateral LE 2 x 30 seconds each LE;  Estim NMES (russian) to L anterior tibialis at intensity required to get sufficient L ankle DF contraction,10s on, 20s off with 2 second  ramp, pt performed 10 minutes of active contraction during on time to improve ant tibialis strength, decreased toe drag.                        PT Education - 05/19/16 1312    Education provided Yes   Education Details form/technique correction during session, plan of care   Person(s) Educated Patient   Methods Explanation   Comprehension Verbalized understanding             PT Long Term Goals - 04/25/16 1150      PT LONG TERM GOAL #1   Title Patient will increase BLE gross strength to 4+/5 as to improve functional strength for independent gait, increased standing tolerance and increased ADL ability.   Time 12   Period Weeks   Status On-going     PT LONG TERM GOAL #2   Title Patient will reduce timed up and go to <11 seconds to reduce fall risk and demonstrate improved transfer/gait ability.   Time 12   Period Weeks   Status On-going     PT LONG TERM GOAL #3   Title Patient will be independent with ascend/descend 12 steps using single UE in step over step pattern without LOB.   Time 12   Period Weeks  Status On-going     PT LONG TERM GOAL #4   Title Patient will increase six minute walk test distance to >1200 for progression to community ambulator and improve gait ability   Time 12   Status Achieved     PT LONG TERM GOAL #5   Title Patient will be able to run for 5 minutes at a slow jog without ankle swelling following the run.    Time 12   Status On-going               Plan - 05/19/16 1312    Clinical Impression Statement Pt demonstrates difficulty with inline balance and well as lack of coordination with ball rolls using LLE. He is able to improve his sequencing with alternating toe tap jumps and box jumps improve with increased repetition today. Pt encouraged to follow-up as scheduled. 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    Rehab  Potential Good   PT Frequency 2x / week   PT Duration 12 weeks   PT Treatment/Interventions Therapeutic exercise;Balance training;Neuromuscular re-education;Therapeutic activities;Gait training;Manual techniques   PT Next Visit Plan Continue strength and balance, utilizing more dynamic and sport-specific activities to work towards goal of returning to wrestling.    PT Home Exercise Plan Continue as prescribed   Consulted and Agree with Plan of Care Patient      Patient will benefit from skilled therapeutic intervention in order to improve the following deficits and impairments:  Decreased balance, Impaired sensation, Obesity, Decreased activity tolerance, Decreased coordination, Decreased strength, Cardiopulmonary status limiting activity  Visit Diagnosis: Other lack of coordination  Muscle weakness (generalized)     Problem List There are no active problems to display for this patient.  Lynnea MaizesJason D Bryli Mantey PT, DPT   Braxtyn Bojarski 05/19/2016, 2:31 PM  [place] [facility] [address] [place], [place], [address] Phone: [phone]   Fax:  [phone]  Name: Dalton CarlLawrence Eskelson MRN: [id number] Date of Birth: [birth year]

## 2016-05-30 ENCOUNTER — Ambulatory Visit: Payer: BC Managed Care – PPO | Attending: Physical Medicine and Rehabilitation | Admitting: Physical Therapy

## 2016-05-30 DIAGNOSIS — R262 Difficulty in walking, not elsewhere classified: Secondary | ICD-10-CM | POA: Diagnosis present

## 2016-05-30 DIAGNOSIS — M6281 Muscle weakness (generalized): Secondary | ICD-10-CM | POA: Insufficient documentation

## 2016-05-30 DIAGNOSIS — R278 Other lack of coordination: Secondary | ICD-10-CM | POA: Insufficient documentation

## 2016-05-30 NOTE — Therapy (Signed)
[place] [facility] [address], [place], [address] Phone: [phone]   Fax:  [phone]  Physical Therapy Treatment  Patient Details  Name: Dalton Mathis MRN: [id number] Date of Birth: January 19, 1966 Referring Provider: Merrily Brittle  Encounter Date: 05/30/2016      PT End of Session - 05/30/16 1121    Visit Number 28   Number of Visits 49   Date for PT Re-Evaluation 06/19/16   PT Start Time 1030   PT Stop Time 1120   PT Time Calculation (min) 50 min   Activity Tolerance Patient tolerated treatment well   Behavior During Therapy [facility] for tasks assessed/performed      Past Medical History:  Diagnosis Date  . Hypertension   . Stroke [facility])     No past surgical history on file.  There were no vitals filed for this visit.      Subjective Assessment - 05/30/16 1106    Subjective Patient reports he is continuing to notice higher level balance deficits, NM control deficits impairing him from performing his work related duties. He reports his work related needs are: running, jumping, squats, deadlifts, getting off the floor, pushing opponents, and rotation in standing to turn opponents.    Limitations Walking;Lifting   How long can you stand comfortably? 1/2 hour same as before the cva   How long can you walk comfortably? 30 mintues   Patient Stated Goals for his left leg to not be numb   Currently in Pain? No/denies      Assessment  L hip flexor 3/3-/5 L Quad, HS, DF - 4/5 L PF, INV, EVERSION - 4+/5   SLS on RLE 5-8", < 3" on LLE. With eyes closed < 3" on R, unable on L   Sit to stand -- with no hands, shifts to RLE   Side shuffle, abbreviated stance time on LLE relative to RLE lateral flexion noted on LLE relative to RLE.   Gait - noted decreased DF, knee in 5 degrees of flexion at mid stance and initial contact on LLE. Jogging noted decreased DF, decreased stance time L, trunk flexed  anteriorly   Step ups with blue foam on 2 steps with HHA intermittently x 12 for 2 sets on LLE  Standing BOSU kicks (hip abductions x 12 for 2 sets -- modestly challenging, more for balance)   Blue foam pad standing hip abductions with green t-band - more challenging 2 sets x 10 repetitions on LLE   TRX single leg eccentric part of squat -- x 6 for 2 sets (quite challenging for him)                            PT Education - 05/30/16 1246    Education provided Yes   Education Details Will go over gym routine in next session. Went over deficits noted in gait and jogging.    Person(s) Educated Patient   Methods Explanation;Demonstration;Handout   Comprehension Verbalized understanding;Returned demonstration             PT Long Term Goals - 04/25/16 1150      PT LONG TERM GOAL #1   Title Patient will increase BLE gross strength to 4+/5 as to improve functional strength for independent gait, increased standing tolerance and increased ADL ability.   Time 12   Period Weeks   Status On-going     PT LONG TERM GOAL #2  Title Patient will reduce timed up and go to <11 seconds to reduce fall risk and demonstrate improved transfer/gait ability.   Time 12   Period Weeks   Status On-going     PT LONG TERM GOAL #3   Title Patient will be independent with ascend/descend 12 steps using single UE in step over step pattern without LOB.   Time 12   Period Weeks   Status On-going     PT LONG TERM GOAL #4   Title Patient will increase six minute walk test distance to >1200 for progression to community ambulator and improve gait ability   Time 12   Status Achieved     PT LONG TERM GOAL #5   Title Patient will be able to run for 5 minutes at a slow jog without ankle swelling following the run.    Time 12   Status On-going               Plan - 05/30/16 1247    Clinical Impression Statement Patient demonstrates higher level balance deficits secondary to  decreased sensation and proprioception on LLE as well as weakness in all major muscle groups of the LLE. Patient demonstrates decreased balance and NM control on LLE with compliant surfaces and hip flexor/abductor strength deficits leading him to have difficulty with sit to stands on one leg. Patient would benefit from skilled PT services to address his balance/strength deficits.    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    PT Home Exercise Plan Continue as prescribed   Consulted and Agree with Plan of Care Patient      Patient will benefit from skilled therapeutic intervention in order to improve the following deficits and impairments:  Decreased balance, Impaired sensation, Obesity, Decreased activity tolerance, Decreased coordination, Decreased strength, Cardiopulmonary status limiting activity  Visit Diagnosis: Other lack of coordination  Muscle weakness (generalized)  Difficulty in walking, not elsewhere classified     Problem List There are no active problems to display for this patient.  Kerin RansomPatrick A McNamara, PT, DPT    05/30/2016, 12:50 PM   [facility] [address] [place], [place], [address] Phone: [phone]   Fax:  [phone]  Name: Malachi CarlLawrence Elsen MRN: [id number] Date of Birth: March 23, 1966

## 2016-05-30 NOTE — Patient Instructions (Signed)
Step ups with blue foam on 2 steps   Standing BOSU kicks (hip abductions   Blue foam pad standing hip abductions with green t-band   TRX single leg eccentric part of squat

## 2016-06-01 ENCOUNTER — Ambulatory Visit: Payer: BC Managed Care – PPO | Admitting: Physical Therapy

## 2016-06-01 DIAGNOSIS — R278 Other lack of coordination: Secondary | ICD-10-CM | POA: Diagnosis not present

## 2016-06-01 DIAGNOSIS — R262 Difficulty in walking, not elsewhere classified: Secondary | ICD-10-CM

## 2016-06-01 DIAGNOSIS — M6281 Muscle weakness (generalized): Secondary | ICD-10-CM

## 2016-06-01 NOTE — Therapy (Signed)
[place] [address] [place], [place], [address] Phone: [phone]   Fax:  [phone]  Physical Therapy Treatment  Patient Details  Name: Dalton Mathis MRN: [id number] Date of Birth: November 13, 1965 Referring Provider: Merrily BrittleAUCH, KIMBERLY KARRAT  Encounter Date: 06/01/2016      PT End of Session - 06/01/16 1126    Visit Number 29   Number of Visits 49   Date for PT Re-Evaluation 06/19/16   PT Start Time 1030   PT Stop Time 1115   PT Time Calculation (min) 45 min   Activity Tolerance Patient tolerated treatment well   Behavior During Therapy [facility] for tasks assessed/performed      Past Medical History:  Diagnosis Date  . Hypertension   . Stroke [facility](HCC)     No past surgical history on file.  There were no vitals filed for this visit.      Subjective Assessment - 06/01/16 1133    Subjective Patient reports he found a lot of benefit from watching the videos of his gait, jogging, etc. as he can now see and understand where his residual deficits stem from.    Limitations Walking;Lifting   How long can you stand comfortably? 1/2 hour same as before the cva   How long can you walk comfortably? 30 mintues   Patient Stated Goals for his left leg to not be numb   Currently in Pain? No/denies     Eccentric DF contractions on LLE only with HHA as needed with 10" on 10" off Russian stim applied to TA for 4 minutes. Concentric DF AROM in standing (toe raises) x 3 minutes with the same NMES protocol as above.  Repeated eccentric DF contractions off step with same protocol for 2 minutes to conclude session. Noted increased DF in gait prior to leaving on video recording than in previous visit.   TG Single leg jumps at lvl 11 (4x6-8 repetitions) -- cued for softer landings (felt fatigue after this)  TG single leg squats 3 sets x 8 at lvl 20 for the first two, then lvl 17 for 3rd set (cued for increased knee flexion, felt  fatigue after completion)  Matrix hip abductions 3 sets x 15 initially at 55#  on 2nd and 3rd sets   BOSU ball single leg stance with HHA, progressed to direction kicks from RLE (forward, backward, laterally) x 2 minutes x 2 (notable for significant postural response deficits, delayed ankle stabilization, delayed trunk response)  BOSU squats with HHA (2 leg) notable for weight shift to RLE initially, cued to load LLE completed x8 with improved LLE loading, decreased support from LUE with repetitions.                            PT Education - 06/01/16 1125    Education provided Yes   Education Details Need to fatigue his LLE to the point he experiences some level of DOMS the following day. Improved DF AROM in gait already on video.    Person(s) Educated Patient   Methods Explanation   Comprehension Verbalized understanding             PT Long Term Goals - 04/25/16 1150      PT LONG TERM GOAL #1   Title Patient will increase BLE gross strength to 4+/5 as to improve functional strength for independent gait, increased standing tolerance and increased ADL ability.   Time 12  Period Weeks   Status On-going     PT LONG TERM GOAL #2   Title Patient will reduce timed up and go to <11 seconds to reduce fall risk and demonstrate improved transfer/gait ability.   Time 12   Period Weeks   Status On-going     PT LONG TERM GOAL #3   Title Patient will be independent with ascend/descend 12 steps using single UE in step over step pattern without LOB.   Time 12   Period Weeks   Status On-going     PT LONG TERM GOAL #4   Title Patient will increase six minute walk test distance to >1200 for progression to community ambulator and improve gait ability   Time 12   Status Achieved     PT LONG TERM GOAL #5   Title Patient will be able to run for 5 minutes at a slow jog without ankle swelling following the run.    Time 12   Status On-going                Plan - 06/01/16 1127    Clinical Impression Statement Patient demonstrates improvement in this session after NMES on tibialis anterior in functional performance of DF AROM during gait on video relative to previous session. He continues to have power production and absorption deficits as noted by inability to hop on LLE as well as proprioceptive and reactive balance deficits on BOSU (noted shifting to R side in double leg squats). He will continue to benefit from skilled PT services to address his deficits for return to work and occupational demands.    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    PT Home Exercise Plan Continue as prescribed   Consulted and Agree with Plan of Care Patient      Patient will benefit from skilled therapeutic intervention in order to improve the following deficits and impairments:  Decreased balance, Impaired sensation, Obesity, Decreased activity tolerance, Decreased coordination, Decreased strength, Cardiopulmonary status limiting activity  Visit Diagnosis: Other lack of coordination  Muscle weakness (generalized)  Difficulty in walking, not elsewhere classified     Problem List There are no active problems to display for this patient.  Kerin Ransom, PT, DPT    06/01/2016, 11:37 AM  [place] [facility] PHYSICAL AND SPORTS MEDICINE [address] [place], [address] Phone: [phone]   Fax:  [phone]  Name: Dalton Mathis MRN: [id number] Date of Birth: 1965/11/21

## 2016-06-01 NOTE — Patient Instructions (Signed)
TG Single leg jumps at lvl 11 (4x6-8  TG single leg squats 3 sets x 8 at lvl 20 for the first two, then lvl 17 for 3rd set   Matrix hip abductions 3 sets x 15 initially at 55#  on 2nd and 3rd sets

## 2016-06-08 ENCOUNTER — Ambulatory Visit: Payer: BC Managed Care – PPO | Admitting: Physical Therapy

## 2016-06-10 ENCOUNTER — Ambulatory Visit: Payer: BC Managed Care – PPO | Admitting: Physical Therapy

## 2016-06-10 DIAGNOSIS — M6281 Muscle weakness (generalized): Secondary | ICD-10-CM

## 2016-06-10 DIAGNOSIS — R278 Other lack of coordination: Secondary | ICD-10-CM

## 2016-06-10 DIAGNOSIS — R262 Difficulty in walking, not elsewhere classified: Secondary | ICD-10-CM

## 2016-06-10 NOTE — Patient Instructions (Signed)
Leg Press 5 sets on LLE only at 90# x 10 (easy) at 120 x 12 (easy), at 135 (more challenging at 10), at 150 -- 10 (very challenging)  -- x 10 for 2 sets total  Hopping with UE on LLE only (notable for decreased power output) x 10 for 2 sets   Lateral hopping with LLE only and HHA x 8 (medial and lateral)    Lateral hops with LLE only to blue foam pad x 8 (cuing for increased knee flexion in landing) used HHA    AutoNationBall slams with 2kg, 3kg x 5, x 6 from R side only to mimic professional needs   Rockerboard SLS on LL only (continues to have single leg stance deficits on LLE)   Dynadisc single leg stance (catching balls x 3 minutes)

## 2016-06-10 NOTE — Therapy (Signed)
[facility] [address] [place], [place], [address] Phone: [phone]   Fax:  [phone]  Physical Therapy Treatment  Patient Details  Name: Dalton Mathis MRN: [id number] Date of Birth: [birth year] Referring Provider: Merrily BrittleAUCH, KIMBERLY KARRAT  Encounter Date: 06/10/2016      PT End of Session - 06/10/16 1407    Visit Number 30   Number of Visits 49   Date for PT Re-Evaluation 06/19/16   PT Start Time 1406   PT Stop Time 1455   PT Time Calculation (min) 49 min   Activity Tolerance Patient tolerated treatment well   Behavior During Therapy [facility] for tasks assessed/performed      Past Medical History:  Diagnosis Date  . Hypertension   . Stroke [facility](HCC)     No past surgical history on file.  There were no vitals filed for this visit.      Subjective Assessment - 06/10/16 1406    Subjective Patient reports he had to shovel ice/snow to get out yesterday. He reports his HEP is going well.    Limitations Walking;Lifting   How long can you stand comfortably? 1/2 hour same as before the cva   How long can you walk comfortably? 30 mintues   Patient Stated Goals for his left leg to not be numb   Currently in Pain? No/denies     Leg Press 5 sets on LLE only at 90# x 10 (easy) at 120 x 12 (easy), at 135 (more challenging at 10), at 150 -- 10 (very challenging)  -- x 10 for 2 sets total  Hopping with UE on LLE only (notable for decreased power output) x 10 for 2 sets   Lateral hopping with LLE only and HHA x 8 (medial and lateral)    Lateral hops with LLE only to blue foam pad x 8 (cuing for increased knee flexion in landing) used HHA    AutoNationBall slams with 2kg, 3kg x 5, x 6 from R side only to mimic professional needs   Rockerboard SLS on LL only (continues to have single leg stance deficits on LLE)   Dynadisc single leg stance (catching balls x 3 minutes)   Squats with TRX and BW -- noted to initially flex trunk,  shifted weight to RLE. Cued to use mirror for decreased weight shifting and maintain more erect trunk. Able to complete x 8 with 15# DB (began to fatigue).                             PT Education - 06/10/16 1411    Education provided Yes   Education Details Discussed employment mobility needs and current status with them (running, twisting, SLS, triple extension of LEs.    Person(s) Educated Patient   Methods Explanation;Demonstration   Comprehension Verbalized understanding;Returned demonstration             PT Long Term Goals - 04/25/16 1150      PT LONG TERM GOAL #1   Title Patient will increase BLE gross strength to 4+/5 as to improve functional strength for independent gait, increased standing tolerance and increased ADL ability.   Time 12   Period Weeks   Status On-going     PT LONG TERM GOAL #2   Title Patient will reduce timed up and go to <11 seconds to reduce fall risk and demonstrate improved transfer/gait ability.   Time 12   Period  Weeks   Status On-going     PT LONG TERM GOAL #3   Title Patient will be independent with ascend/descend 12 steps using single UE in step over step pattern without LOB.   Time 12   Period Weeks   Status On-going     PT LONG TERM GOAL #4   Title Patient will increase six minute walk test distance to >1200 for progression to community ambulator and improve gait ability   Time 12   Status Achieved     PT LONG TERM GOAL #5   Title Patient will be able to run for 5 minutes at a slow jog without ankle swelling following the run.    Time 12   Status On-going               Plan - 06/10/16 1411    Clinical Impression Statement Patient demonstrating steady improvement in LE strength, though on TM notable for auditory "foot slap" on LLE secondary to decreased DF eccentric control. He continues to have L quadricep weakness notable when he requires high rate of force development like landing. He is  progressing well with plyometric demands, but would continue to benefit from skilled PT services to address his mobility deficits.    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    PT Home Exercise Plan Continue as prescribed   Consulted and Agree with Plan of Care Patient      Patient will benefit from skilled therapeutic intervention in order to improve the following deficits and impairments:  Decreased balance, Impaired sensation, Obesity, Decreased activity tolerance, Decreased coordination, Decreased strength, Cardiopulmonary status limiting activity  Visit Diagnosis: Muscle weakness (generalized)  Difficulty in walking, not elsewhere classified  Other lack of coordination     Problem List There are no active problems to display for this patient.   Kerin Ransom, PT, DPT    06/10/2016, 3:09 PM  [place] [facility] MAIN [facility] SERVICES [address] [place], [place], [address] Phone: [phone]   Fax:  [phone]  Name: Dalton Mathis MRN: [id number] Date of Birth: [birth year]

## 2016-06-13 ENCOUNTER — Ambulatory Visit: Payer: BC Managed Care – PPO | Admitting: Physical Therapy

## 2016-06-13 DIAGNOSIS — R278 Other lack of coordination: Secondary | ICD-10-CM | POA: Diagnosis not present

## 2016-06-13 DIAGNOSIS — R262 Difficulty in walking, not elsewhere classified: Secondary | ICD-10-CM

## 2016-06-13 DIAGNOSIS — M6281 Muscle weakness (generalized): Secondary | ICD-10-CM

## 2016-06-13 NOTE — Therapy (Deleted)
[place] [facility] PH[facility]ORTS MEDICINE [address] [place], [address] Phone: [phone]   Fax:  [phone]  Physical Therapy Evaluation  Patient Details  Name: Dalton Mathis MRN: [id number] Date of Birth: April 16, 1966 Referring Provider: Merrily Brittle  Encounter Date: 06/13/2016      PT End of Session - 06/13/16 0955    Visit Number 31   Number of Visits 49   Date for PT Re-Evaluation 06/19/16   PT Start Time 0910   PT Stop Time 0952   PT Time Calculation (min) 42 min   Activity Tolerance Patient tolerated treatment well   Behavior During Therapy [facility] for tasks assessed/performed      Past Medical History:  Diagnosis Date  . Hypertension   . Stroke [facility])     No past surgical history on file.  There were no vitals filed for this visit.       Subjective Assessment - 06/13/16 0950    Subjective Patient reports he was sore for several days after previous session. Reports he took things fairly easy over the weekend.    Limitations Walking;Lifting   How long can you stand comfortably? 1/2 hour same as before the cva   How long can you walk comfortably? 30 mintues   Patient Stated Goals for his left leg to not be numb   Currently in Pain? 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    Person(s) Educated Patient   Methods Explanation;Demonstration   Comprehension Verbalized understanding;Returned demonstration             PT Long Term Goals - 04/25/16 1150      PT LONG TERM GOAL #1   Title Patient will increase BLE gross strength to 4+/5 as to improve functional strength for independent gait, increased standing tolerance and increased ADL ability.   Time 12   Period Weeks   Status On-going     PT LONG TERM GOAL #2   Title  Patient will reduce timed up and go to <11 seconds to reduce fall risk and demonstrate improved transfer/gait ability.   Time 12   Period Weeks   Status On-going     PT LONG TERM GOAL #3   Title Patient will be independent with ascend/descend 12 steps using single UE in step over step pattern without LOB.   Time 12   Period Weeks   Status On-going     PT LONG TERM GOAL #4   Title Patient will increase six minute walk test distance to >1200 for progression to community ambulator and improve gait ability   Time 12   Status Achieved     PT LONG TERM GOAL #5   Title Patient will be able to run for 5 minutes at a slow jog without ankle swelling following the run.    Time 12   Status On-going               Plan - 06/13/16 6962    Clinical Impression Statement Patient demonstrates improved gait pattern  (increased DF in walking), though he continues to have deficits in quadricep/DF strength (especially eccentric) on LLE noted in gait to have decreased stance  time on LLE, decreased loading repsonse on L quadricep. Patient demonstrates significant quadricep fatigue/strength deficits such that he began buckling later in session. He would continue to benefit from skilled PT services to address his deficits.    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    PT Home Exercise Plan Continue as prescribed   Consulted and Agree with Plan of Care Patient      Patient will benefit from skilled therapeutic intervention in order to improve the following deficits and impairments:  Decreased balance, Impaired sensation, Obesity, Decreased activity tolerance, Decreased  coordination, Decreased strength, Cardiopulmonary status limiting activity  Visit Diagnosis: Difficulty in walking, not elsewhere classified  Muscle weakness (generalized)  Other lack of coordination     Problem List There are no active problems to display for this patient.   Alva Garnetatrick McNamara 06/13/2016, 10:04 AM  [place] [facility] [address] [place], [place], [address] Phone: [phone]   Fax:  [phone]  Name: Dalton Mathis MRN: [id number] Date of Birth: 1965-09-25

## 2016-06-13 NOTE — Therapy (Signed)
[place] [facility] [address] [place], [place], [address] Phone: [phone]   Fax:  [phone]  Physical Therapy Treatment  Patient Details  Name: Dalton CarlLawrence Mezera MRN: [id number] Date of Birth: May 20, 1966 Referring Provider: Merrily BrittleAUCH, KIMBERLY KARRAT  Encounter Date: 06/13/2016      PT End of Session - 06/13/16 0955    Visit Number 31   Number of Visits 49   Date for PT Re-Evaluation 06/19/16   PT Start Time 0910   PT Stop Time 0952   PT Time Calculation (min) 42 min   Activity Tolerance Patient tolerated treatment well   Behavior [facility] Therapy [facility] for tasks assessed/performed      Past Medical History:  Diagnosis Date  . Hypertension   . Stroke [facility](HCC)     No past surgical history on file.  There were no vitals filed for this visit.      Subjective Assessment - 06/13/16 0950    Subjective Patient reports he was sore for several days after previous session. Reports he took things fairly easy over the weekend.    Limitations Walking;Lifting   How long can you stand comfortably? 1/2 hour same as before the cva   How long can you walk comfortably? 30 mintues   Patient Stated Goals for his left leg to not be numb   Currently in Pain? No/denies     Eccentric DF contractions 10" on: 10" off x 3 minutes on [place] Current (44 mA)   Concentric DF contractions 10" on: 10" off x 3 minutes on [place] Current (44mA)   Walking on video analysis- appropriate DF, appeared to have appropriate loading response. On jogging analysis -- noted for decreased stance time on LLE (poor loading response of quadricep and decreased DF)  Single leg hop and eccentric contractions on TG level 14 5 sets x 8 repetitions   Single leg press x 55# for 3 sets (very challenging)   Single leg lateral step ups to 2 blocks with hip flexion on RLE at high speed x 8 (progressed to 7#) x 8  Single leg stance (able to hold >10") -- added in  eyes closed (much more challenging for him)   Medial, lateral, central single leg deadlifts on blue foam pad with finger assist x 3 rounds (began to buckle, thus dc'd)                              PT Education - 06/13/16 0954    Education provided Yes   Education Details Bring video recording device 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    Person(s) Educated Patient   Methods Explanation;Demonstration   Comprehension Verbalized understanding;Returned demonstration             PT Long Term Goals - 04/25/16 1150      PT LONG TERM GOAL #1   Title Patient will increase BLE gross strength to 4+/5 as to improve functional strength for independent gait, increased standing tolerance and increased ADL ability.   Time 12   Period Weeks   Status On-going     PT LONG TERM GOAL #2   Title Patient will reduce timed up and go to <11 seconds to reduce fall risk and demonstrate improved transfer/gait ability.   Time 12   Period Weeks   Status On-going     PT LONG TERM GOAL #3   Title Patient will be  independent with ascend/descend 12 steps using single UE in step over step pattern without LOB.   Time 12   Period Weeks   Status On-going     PT LONG TERM GOAL #4   Title Patient will increase six minute walk test distance to >1200 for progression to community ambulator and improve gait ability   Time 12   Status Achieved     PT LONG TERM GOAL #5   Title Patient will be able to run for 5 minutes at a slow jog without ankle swelling following the run.    Time 12   Status On-going               Plan - 06/13/16 9604    Clinical Impression Statement Patient demonstrates improved gait pattern  (increased DF in walking), though he continues to have deficits in quadricep/DF strength (especially eccentric) on LLE noted in gait to have decreased stance time on LLE, decreased loading repsonse on L quadricep. Patient demonstrates significant quadricep  fatigue/strength deficits such that he began buckling later in session. He would continue to benefit from skilled PT services to address his deficits.    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    PT Home Exercise Plan Continue as prescribed   Consulted and Agree with Plan of Care Patient      Patient will benefit from skilled therapeutic intervention in order to improve the following deficits and impairments:  Decreased balance, Impaired sensation, Obesity, Decreased activity tolerance, Decreased coordination, Decreased strength, Cardiopulmonary status limiting activity  Visit Diagnosis: Difficulty in walking, not elsewhere classified  Muscle weakness (generalized)  Other lack of coordination     Problem List There are no active problems to display for this patient.  Kerin Ransom, PT, DPT     06/13/2016, 10:04 AM  [place] [facility] PHYSICAL AND SPORTS MEDICINE [address] [place], [address] Phone: [phone]   Fax:  [phone]  Name: Qualyn Oyervides MRN: [id number] Date of Birth: 10-25-1965

## 2016-06-15 ENCOUNTER — Ambulatory Visit: Payer: BC Managed Care – PPO | Admitting: Physical Therapy

## 2016-06-15 DIAGNOSIS — M6281 Muscle weakness (generalized): Secondary | ICD-10-CM

## 2016-06-15 DIAGNOSIS — R278 Other lack of coordination: Secondary | ICD-10-CM

## 2016-06-15 DIAGNOSIS — R262 Difficulty in walking, not elsewhere classified: Secondary | ICD-10-CM

## 2016-06-15 NOTE — Therapy (Signed)
[place] [facility]INE [address] [place], [place], [address] Phone: [phone]   Fax:  [phone]  Physical Therapy Treatment  Patient Details  Name: Dalton CarlLawrence Leatherwood MRN: [id number] Date of Birth: 03-24-1966 Referring Provider: Merrily BrittleAUCH, KIMBERLY KARRAT  Encounter Date: 06/15/2016      PT End of Session - 06/15/16 1305    Visit Number 32   Number of Visits 49   Date for PT Re-Evaluation 06/19/16   PT Start Time 1030   PT Stop Time 1115   PT Time Calculation (min) 45 min   Activity Tolerance Patient tolerated treatment well   Behavior During Therapy [facility] for tasks assessed/performed      Past Medical History:  Diagnosis Date  . Hypertension   . Stroke [facility](HCC)     No past surgical history on file.  There were no vitals filed for this visit.      Subjective Assessment - 06/15/16 1304    Subjective Patient reports he tried hopping yesterday but still has difficulty with propulsion on his LLE, he is able to land on LLE only with use of UEs for support.    Limitations Walking;Lifting   How long can you stand comfortably? 1/2 hour same as before the cva   How long can you walk comfortably? 30 mintues   Patient Stated Goals for his left leg to not be numb   Currently in Pain? No/denies      PF/DF eccentric control with E-stim (russian stim 10:10 for 3 minutes), progressed to active DF on similar setting (58 mA used during on times)   Hopping on LLE only with HHA x 3 sets of 8 repetitions (difficult to generate sufficient propulsion without use of UEs)  Hops on Total Gym level 17 -- 4 sets x8 repetitions on LLE only  Single leg jumps on total gym level 17 4 sets x 8 repetitions   Jogging/walking/ side shuffling analysis -- in gait noted to have LLE in inversion during swing phase. In jogging LUE noted to be flexed and adducted, ankle/foot in inversion. In side shuffling noted to have poor loading/knee flexion in  transitions from one direction to the next. Also showed difficulty in propelling himself to the L.   For jogging had patient hold a water bottle in LUE and have water bottle provide kinesthetic feedback, which significantly improved his LUE swing on video feedback.   Seated red t-band eversion x 12 for 3 sets   Lateral hopping on LLE only x 5 for 3 sets with UE support (cuing for more controlled/softer landings).                            PT Education - 06/15/16 1304    Education provided Yes   Education Details Bring video from wrestling this weekend, let PT know what his residual deficits are.    Person(s) Educated Patient   Methods Explanation;Demonstration;Handout   Comprehension Returned demonstration;Verbalized understanding             PT Long Term Goals - 04/25/16 1150      PT LONG TERM GOAL #1   Title Patient will increase BLE gross strength to 4+/5 as to improve functional strength for independent gait, increased standing tolerance and increased ADL ability.   Time 12   Period Weeks   Status On-going     PT LONG TERM GOAL #2   Title Patient will reduce timed  up and go to <11 seconds to reduce fall risk and demonstrate improved transfer/gait ability.   Time 12   Period Weeks   Status On-going     PT LONG TERM GOAL #3   Title Patient will be independent with ascend/descend 12 steps using single UE in step over step pattern without LOB.   Time 12   Period Weeks   Status On-going     PT LONG TERM GOAL #4   Title Patient will increase six minute walk test distance to >1200 for progression to community ambulator and improve gait ability   Time 12   Status Achieved     PT LONG TERM GOAL #5   Title Patient will be able to run for 5 minutes at a slow jog without ankle swelling following the run.    Time 12   Status On-going               Plan - 06/15/16 1305    Clinical Impression Statement Patient continues to demonstrate  improving gait and jogging mechanics. In side shuffling he is noted to continue to heavily favor his RLE (poor loading through LLE) in jogging he has improved spatio-temporal mechanics but has minimal arm swing on LUE initially. He also demonstrates eversion deficit in jogging on LLE. Patient otherwise is progressin well,, though given his deficits will require additional skilled PT services to allow for full return to work duties.    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    PT Home Exercise Plan Continue as prescribed   Consulted and Agree with Plan of Care Patient      Patient will benefit from skilled therapeutic intervention in order to improve the following deficits and impairments:  Decreased balance, Impaired sensation, Obesity, Decreased activity tolerance, Decreased coordination, Decreased strength, Cardiopulmonary status limiting activity  Visit Diagnosis: Difficulty in walking, not elsewhere classified  Muscle weakness (generalized)  Other lack of coordination     Problem List There are no active problems to display for this patient.  Kerin Ransom, PT, DPT    06/15/2016, 1:10 PM  [place] [facility] PHYSICAL AND SPORTS MEDICINE [address] [place], [address] Phone: [phone]   Fax:  [phone]  Name: Arul Farabee MRN: [id number] Date of Birth: 1965-07-03

## 2016-06-20 ENCOUNTER — Ambulatory Visit: Payer: BC Managed Care – PPO | Admitting: Physical Therapy

## 2016-06-20 ENCOUNTER — Encounter: Payer: Self-pay | Admitting: Physical Therapy

## 2016-06-20 DIAGNOSIS — R262 Difficulty in walking, not elsewhere classified: Secondary | ICD-10-CM

## 2016-06-20 DIAGNOSIS — M6281 Muscle weakness (generalized): Secondary | ICD-10-CM

## 2016-06-20 DIAGNOSIS — R278 Other lack of coordination: Secondary | ICD-10-CM

## 2016-06-20 NOTE — Therapy (Signed)
[place] [facility] PHYS[facility] SPORTS MEDICINE [address] [place], [address] Phone: [phone]   Fax:  [phone]  Physical Therapy Treatment  Patient Details  Name: Dalton Mathis MRN: [id number] Date of Birth: [birth year] Referring Provider: Merrily Brittle  Encounter Date: 06/20/2016      PT End of Session - 06/20/16 1041    Visit Number 33   Number of Visits 73   Date for PT Re-Evaluation 06/19/16   PT Start Time 0858   PT Stop Time 0943   PT Time Calculation (min) 45 min   Activity Tolerance Patient tolerated treatment well   Behavior During Therapy [facility] for tasks assessed/performed      Past Medical History:  Diagnosis Date  . Hypertension   . Stroke [facility])     History reviewed. No pertinent surgical history.  There were no vitals filed for this visit.      Subjective Assessment - 06/20/16 0859    Subjective Pt reports he is doing well this date and does not have any new complaints or changes since last session.  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    Limitations Walking;Lifting   How long can you stand comfortably? 1/2 hour same as before the cva   How long can you walk comfortably? 30 mintues   Patient Stated Goals for his left leg to not be numb   Currently in Pain? No/denies         TREATMENT   Therapeutic Exercise:  Ant step downs/single LLE squat from 6" step with cues for eccentric control. Mild knee valgus noted during end range and cues provided for glute activation. Increased control and pt reported he felt more steady when focusing on glute activation.  LLE hip hikes on 6" step with cues for exaggerated R hip hike and glute squeeze for increased glute activation.  Hops on Total Gym level 17 -- 4 sets x8 repetitions on LLE only with rest breaks between each  set  Hop down from 6" step and explosion jump up with BLEs x8  Hopping down from 6" step and cutting to L x4 and x8 cutting R with cues to push off with L foot when cutting R as pt has tendency to shuffle feet on land and cutting off of R foot.  Single leg stance on airex with 2 fingers supported on treadmill bar. Cues for eversion as pt demonstrating inversion initially. Cues for glute activation. 3x30 seconds  Lateral hopping from LLE to RLE and RLE to LLE, x10 each side.                  PT Education - 06/20/16 0900    Education provided Yes   Education Details Exercise technique; cues for muscle recruitment; role of each exercise   Person(s) Educated Patient   Methods Explanation;Demonstration   Comprehension Verbalized understanding;Need further instruction;Returned demonstration;Verbal cues required             PT Long Term Goals - 06/20/16 1042      PT LONG TERM GOAL #1   Title Patient will increase BLE gross strength to 4+/5 as to improve functional strength for independent gait, increased standing tolerance and increased ADL ability.   Baseline See Treatment note for update on 06/20/16   Time 12   Period Weeks  Status On-going     PT LONG TERM GOAL #2   Title Patient will reduce timed up and go to <11 seconds to reduce fall risk and demonstrate improved transfer/gait ability.   Time 12   Period Weeks   Status On-going     PT LONG TERM GOAL #3   Title Patient will be independent with ascend/descend 12 steps using single UE in step over step pattern without LOB.   Baseline See Treatment note on 06/20/16    Time 12   Period Weeks   Status Achieved     PT LONG TERM GOAL #4   Title Patient will increase six minute walk test distance to >1200 for progression to community ambulator and improve gait ability   Time 12   Status Achieved     PT LONG TERM GOAL #5   Title Patient will be able to run for 5 minutes at a slow jog without ankle swelling following  the run.    Time 12   Status On-going               Plan - 06/20/16 [id number]    Clinical Impression Statement Pt demonstrates impaired glute and quad control when descending steps and cues provided and exercises directed at targeting these muscle groups which pt tolerated well but demonstrated fatigue toward end of each set.  He demonstrated 4/5 strength in majority of LLE with MMT and will benefit from continued PT services for improvement in strength and neuromuscular control with static and dynamic activities.   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    PT Home Exercise Plan Continue as prescribed   Consulted and Agree with Plan of Care Patient      Patient will benefit from skilled therapeutic intervention in order to improve the following deficits and impairments:  Decreased balance, Impaired sensation, Obesity, Decreased activity tolerance, Decreased coordination, Decreased strength, Cardiopulmonary status limiting activity  Visit Diagnosis: Difficulty in walking, not elsewhere classified  Muscle weakness (generalized)  Other lack of coordination     Problem List There are no active problems to display for this patient.   Encarnacion ChuAshley Janal Haak PT, DPT 06/20/2016, 10:48 AM  [place] [facility] [address] [place], [place], [address] Phone: [phone]   Fax:  [phone]  Name: Dalton Mathis MRN: [id number] Date of Birth: [birth year]

## 2016-06-22 ENCOUNTER — Ambulatory Visit: Payer: BC Managed Care – PPO | Admitting: Physical Therapy

## 2016-06-22 DIAGNOSIS — R262 Difficulty in walking, not elsewhere classified: Secondary | ICD-10-CM

## 2016-06-22 DIAGNOSIS — M6281 Muscle weakness (generalized): Secondary | ICD-10-CM

## 2016-06-22 DIAGNOSIS — R278 Other lack of coordination: Secondary | ICD-10-CM

## 2016-06-22 NOTE — Patient Instructions (Signed)
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

## 2016-06-22 NOTE — Therapy (Signed)
[place] [facility] [address] [place], [address] Phone: [phone]   Fax:  [phone]  Physical Therapy Treatment  Patient Details  Name: Dalton Mathis MRN: [id number] Date of Birth: 01/06/1966 Referring Provider: Leretha Dykes  Encounter Date: 06/22/2016      PT End of Session - 06/22/16 1302    Visit Number 34   Number of Visits 73   Date for PT Re-Evaluation 08/03/16   PT Start Time 1031   PT Stop Time 1115   PT Time Calculation (min) 44 min   Activity Tolerance Patient tolerated treatment well   Behavior During Therapy [facility] for tasks assessed/performed      Past Medical History:  Diagnosis Date  . Hypertension   . Stroke [facility])     No past surgical history on file.  There were no vitals filed for this visit.      Subjective Assessment - 06/22/16 1300    Subjective Patient reports his wrestling practice went well over the weekend, but he did not complete a full set of movements. He reports he surprised himself with how well he was moving.    Limitations Walking;Lifting   How long can you stand comfortably? 1/2 hour same as before the cva   How long can you walk comfortably? 30 mintues   Patient Stated Goals for his left leg to not be numb   Currently in Pain? No/denies      Observed patient in his wrestling ring -- he is able to squat, flex through knees   Lateral shuffling observation on iPad -- noted to have poor force absorption on LLE, poor power generation though improved from previous sessions.   Lateral hop to return to emphasize SSC bilaterally 3 sets x 8 repetitions (noted to have poor foot placement --outside of his shoulder) and thus poor power reproduction on LLE (provided for HEP)   Step up with fast knee flexion, to balance point on BOSU -- proprioceptive deficits on LLE -- 2 sets x 12 repetitions   Observed depth landings (required cuing to have feet land softly  and simulatneously) x 5 per side   Single leg jump squats on LLE only x 6 for 2 sets on lvl 16 repetitions (cued for improved landings)   Double leg jump squats on TG level 16 -- noted to not go through full knee extension- then flexion on LLE compared to RLE on video which was improved with visual feedback and cuing 2 sets x 8 repetitions                             PT Education - 06/22/16 1301    Education provided Yes   Education Details Lateral hops and returns for SSC, difference noted in jumping mechanics, HEP to include depth landings only.    Person(s) Educated Patient   Methods Explanation;Verbal cues;Demonstration   Comprehension Verbal cues required;Verbalized understanding;Returned demonstration             PT Long Term Goals - 06/22/16 1306      PT LONG TERM GOAL #1   Title Patient will increase BLE gross strength to 4+/5 as to improve functional strength for independent gait, increased standing tolerance and increased ADL ability.   Baseline See Treatment note for update on 06/20/16   Time 12   Period Weeks   Status Partially Met     PT LONG TERM GOAL #  2   Title Patient will reduce timed up and go to <11 seconds to reduce fall risk and demonstrate improved transfer/gait ability.   Time 12   Period Weeks   Status On-going     PT LONG TERM GOAL #3   Title Patient will be independent with ascend/descend 12 steps using single UE in step over step pattern without LOB.   Baseline See Treatment note on 06/20/16    Time 12   Period Weeks   Status Achieved     PT LONG TERM GOAL #4   Title Patient will increase six minute walk test distance to >1200 for progression to community ambulator and improve gait ability   Time 12   Status Achieved     PT LONG TERM GOAL #5   Title Patient will be able to run for 5 minutes at a slow jog without ankle swelling following the run.    Baseline Patient is able to jog in clinic, but less than 5 minutes,  noted to have decreased L arm swing but less "limping"   Time 12   Status Partially Met               Plan - 06/22/16 1303    Clinical Impression Statement Patient continues to make improvements with power production, lateral movements through his LLE. His wrestling movements are almost WNL, however he continues to show asymmetries with jumping, landing. Patient would benefit from skilled PT services to address the listed deficits.    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    PT Home Exercise Plan Continue as prescribed   Consulted and Agree with Plan of Care Patient      Patient will benefit from skilled therapeutic intervention in order to improve the following deficits and impairments:  Decreased balance, Impaired sensation, Obesity, Decreased activity tolerance, Decreased coordination, Decreased strength, Cardiopulmonary status limiting activity  Visit Diagnosis: Difficulty in walking, not elsewhere classified  Muscle weakness (generalized)  Other lack of coordination     Problem List There are no active problems to display for this patient.  Kerman Passey, PT, DPT    06/22/2016, 1:09 PM  [place] [facility] [address], [place], [address] Phone: [phone]   Fax:  [phone]  Name: Lister Brizzi MRN: [id number] Date of Birth: Oct 31, 1965

## 2016-06-28 ENCOUNTER — Ambulatory Visit: Payer: BC Managed Care – PPO | Attending: Physical Medicine and Rehabilitation | Admitting: Physical Therapy

## 2016-06-28 DIAGNOSIS — R278 Other lack of coordination: Secondary | ICD-10-CM | POA: Diagnosis present

## 2016-06-28 DIAGNOSIS — R262 Difficulty in walking, not elsewhere classified: Secondary | ICD-10-CM | POA: Diagnosis not present

## 2016-06-28 DIAGNOSIS — M6281 Muscle weakness (generalized): Secondary | ICD-10-CM | POA: Diagnosis present

## 2016-06-28 NOTE — Patient Instructions (Signed)
Single leg squats on TG lvl 24 2 sets x 10   TG Single leg hops lvl 22 x 5 for 3 sets   TG double leg hops lvl 22 (noted to favor RLE, not make it through full knee extension)    Single leg squats with TRX to chair x 5 (very challenging) -- had to regress to having blue foam pad as this was too difficult

## 2016-06-28 NOTE — Therapy (Signed)
[place] [facility] [address], [place], [address] Phone: [phone]   Fax:  [phone]  Physical Therapy Treatment  Patient Details  Name: Dalton Mathis MRN: [id number] Date of Birth: August 18, 1965 Referring Provider: Leretha Dykes  Encounter Date: 06/28/2016      PT End of Session - 06/28/16 1316    Visit Number 35   Number of Visits 27   Date for PT Re-Evaluation 08/03/16   PT Start Time 0900   PT Stop Time 0945   PT Time Calculation (min) 45 min   Activity Tolerance Patient tolerated treatment well   Behavior During Therapy [facility] for tasks assessed/performed      Past Medical History:  Diagnosis Date  . Hypertension   . Stroke [facility])     No past surgical history on file.  There were no vitals filed for this visit.      Subjective Assessment - 06/28/16 0904    Subjective Patient reports he has been diligent with his HEP, his legs are sore from working out yesterday.    Limitations Walking;Lifting   How long can you stand comfortably? 1/2 hour same as before the cva   How long can you walk comfortably? 30 mintues   Patient Stated Goals for his left leg to not be numb   Currently in Pain? No/denies      Single leg squats on TG lvl 24 2 sets x 10   TG Single leg hops lvl 22 x 5 for 3 sets   TG double leg hops lvl 22 (noted to favor RLE, not make it through full knee extension)    Single leg squats with TRX to chair x 5 (very challenging) -- had to regress to having blue foam pad as this was too difficult   Lateral hops with HHA from TM x 8 for 3 sets on LLE (continued to note difficulty with foot placement on LLE relative to RLE and timing indicating deficit in control of the stretch shortening cycle)   Observed side shuffling,patient noted to have difficulty pushing himself to the left relative to R (decreased power production) and when changing directions strong preference for flexion  and WBing through RLE relative to LLE.                            PT Education - 06/28/16 1315    Education provided Yes   Education Details Continues to demonstrate asymmetries in strength and power production on LLE as well as force absorption. Progressed single leg plyometric and strengthening program.    Person(s) Educated Patient   Methods Explanation;Demonstration;Handout;Verbal cues   Comprehension Returned demonstration;Verbalized understanding             PT Long Term Goals - 06/22/16 1306      PT LONG TERM GOAL #1   Title Patient will increase BLE gross strength to 4+/5 as to improve functional strength for independent gait, increased standing tolerance and increased ADL ability.   Baseline See Treatment note for update on 06/20/16   Time 12   Period Weeks   Status Partially Met     PT LONG TERM GOAL #2   Title Patient will reduce timed up and go to <11 seconds to reduce fall risk and demonstrate improved transfer/gait ability.   Time 12   Period Weeks   Status On-going     PT LONG TERM GOAL #3  Title Patient will be independent with ascend/descend 12 steps using single UE in step over step pattern without LOB.   Baseline See Treatment note on 06/20/16    Time 12   Period Weeks   Status Achieved     PT LONG TERM GOAL #4   Title Patient will increase six minute walk test distance to >1200 for progression to community ambulator and improve gait ability   Time 12   Status Achieved     PT LONG TERM GOAL #5   Title Patient will be able to run for 5 minutes at a slow jog without ankle swelling following the run.    Baseline Patient is able to jog in clinic, but less than 5 minutes, noted to have decreased L arm swing but less "limping"   Time 12   Status Partially Met               Plan - 06/28/16 1316    Clinical Impression Statement Patient demonstrating improving strength and power production/absorption on LLE, though continues  to demonstrate asymmetries in bilateral activities (agility drills, hopping with bilateral LEs). He was provided with HEP to target power improvement/strengthening on LLE to allow increased force production/absorption rapdily.    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    PT Home Exercise Plan Continue as prescribed   Consulted and Agree with Plan of Care Patient      Patient will benefit from skilled therapeutic intervention in order to improve the following deficits and impairments:  Decreased balance, Impaired sensation, Obesity, Decreased activity tolerance, Decreased coordination, Decreased strength, Cardiopulmonary status limiting activity  Visit Diagnosis: Difficulty in walking, not elsewhere classified  Muscle weakness (generalized)  Other lack of coordination     Problem List There are no active problems to display for this patient.  Royce Macadamia PT, DPT, CSCS    06/28/2016, 1:19 PM  [place] [facility] [address] [place], [address] Phone: [phone]   Fax:  [phone]  Name: Dalton Mathis MRN: [id number] Date of Birth: 1965/10/21

## 2016-06-30 ENCOUNTER — Ambulatory Visit: Payer: BC Managed Care – PPO | Admitting: Physical Therapy

## 2016-06-30 DIAGNOSIS — R262 Difficulty in walking, not elsewhere classified: Secondary | ICD-10-CM | POA: Diagnosis not present

## 2016-06-30 DIAGNOSIS — R278 Other lack of coordination: Secondary | ICD-10-CM

## 2016-06-30 DIAGNOSIS — M6281 Muscle weakness (generalized): Secondary | ICD-10-CM

## 2016-07-03 NOTE — Therapy (Signed)
[place] [facility] [address], [place], [address] Phone: [phone]   Fax:  [phone]  Physical Therapy Treatment  Patient Details  Name: Dalton Mathis MRN: [id number] Date of Birth: January 22, 1966 Referring Provider: Leretha Dykes  Encounter Date: 06/30/2016      PT End of Session - 07/03/16 2317    Visit Number 36   Number of Visits 9   Date for PT Re-Evaluation 08/03/16   PT Start Time 0905   PT Stop Time 0945   PT Time Calculation (min) 40 min   Activity Tolerance Patient tolerated treatment well   Behavior During Therapy [facility] for tasks assessed/performed      Past Medical History:  Diagnosis Date  . Hypertension   . Stroke [facility])     No past surgical history on file.  There were no vitals filed for this visit.      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 He has been diligent with HEP provided, continues to notice deficits on his LLE.    Limitations Walking;Lifting   How long can you stand comfortably? 1/2 hour same as before the cva   How long can you walk comfortably? 30 mintues   Patient Stated Goals for his left leg to not be numb   Currently in Pain? No/denies       Lateral shuffling filmed - noted to have increased knee flexion and force absorption form previous date on video, however continues to be more erect/upright and have less power when shuffling to his L.   Drop landings from box with 2 risers, initially attempted to complete with subsequent jump, patient noted to favor landing on R side, asymmetrical propulsion/force absorption from R side on video. PT opted to have patient complete just depth landing, which was noted to be improved from initial attempts with video feedback and cuing for increased force absorption through LLE.   Step ups onto BOSU ball on  LLE 2 sets x 10 repetitions with minimal HHA   Single leg deadlifts for LE stability/balance cuing for increasing the amount he kicks back his RLE -- progressed from floor to BOSU ball blue side up with HHA 3 sets x 5 repetitions   Attempted snatch balance with PVC pipe to educate patient on eccentric control through knee flexion, patient does not have appropriate shoulder/thoracic ROM to complete with ideal form, thus dc'd   Patient performed full concentric portion of squat, followed by jump and LLE performing eccentric portion of landing on total gym (level 22) 5 sets x 5 repetitions (cuing for softer landings)                           PT Education - 07/03/16 2316    Education provided Yes   Education Details Continued to stress need for increasing symmetry and power development/asborption on LLE for reduced injury risk and increased performance.    Person(s) Educated Patient   Methods Demonstration;Explanation;Verbal cues;Handout   Comprehension Returned demonstration;Verbalized understanding             PT Long Term Goals - 06/22/16 1306      PT LONG TERM GOAL #1   Title Patient will increase BLE gross strength to 4+/5 as to improve functional strength for independent gait, increased standing tolerance and increased ADL ability.  Baseline See Treatment note for update on 06/20/16   Time 12   Period Weeks   Status Partially Met     PT LONG TERM GOAL #2   Title Patient will reduce timed up and go to <11 seconds to reduce fall risk and demonstrate improved transfer/gait ability.   Time 12   Period Weeks   Status On-going     PT LONG TERM GOAL #3   Title Patient will be independent with ascend/descend 12 steps using single UE in step over step pattern without LOB.   Baseline See Treatment note on 06/20/16    Time 12   Period Weeks   Status Achieved     PT LONG TERM GOAL #4   Title Patient will increase six minute walk test distance to >1200 for  progression to community ambulator and improve gait ability   Time 12   Status Achieved     PT LONG TERM GOAL #5   Title Patient will be able to run for 5 minutes at a slow jog without ankle swelling following the run.    Baseline Patient is able to jog in clinic, but less than 5 minutes, noted to have decreased L arm swing but less "limping"   Time 12   Status Partially Met               Plan - 07/03/16 2318    Clinical Impression Statement Patient demonstrating increased preference for RLE during depth jumps, jump landings, and during lateral shuffling. Lateral shuffling notably improved on change of direction, though power production to his L continues to be noticeably decreased from RLE.    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    PT Home Exercise Plan Continue as prescribed   Consulted and Agree with Plan of Care Patient      Patient will benefit from skilled therapeutic intervention in order to improve the following deficits and impairments:  Decreased balance, Impaired sensation, Obesity, Decreased activity tolerance, Decreased coordination, Decreased strength, Cardiopulmonary status limiting activity  Visit Diagnosis: Difficulty in walking, not elsewhere classified  Muscle weakness (generalized)  Other lack of coordination     Problem List There are no active problems to display for this patient.  Royce Macadamia PT, DPT, CSCS    07/03/2016, 11:20 PM  [place] [facility] [address], [place], [address] Phone: [phone]   Fax:  [phone]  Name: Dalton Mathis MRN: [id number] Date of Birth: [birth year]

## 2016-07-04 ENCOUNTER — Ambulatory Visit: Payer: BC Managed Care – PPO | Admitting: Physical Therapy

## 2016-07-04 DIAGNOSIS — R262 Difficulty in walking, not elsewhere classified: Secondary | ICD-10-CM | POA: Diagnosis not present

## 2016-07-04 DIAGNOSIS — M6281 Muscle weakness (generalized): Secondary | ICD-10-CM

## 2016-07-04 DIAGNOSIS — R278 Other lack of coordination: Secondary | ICD-10-CM

## 2016-07-04 NOTE — Therapy (Signed)
[place] [facility] [address] [place], [address] Phone: [phone]   Fax:  [phone]  Physical Therapy Treatment  Patient Details  Name: Dalton Mathis MRN: [id number] Date of Birth: [birth year] Referring Provider: Leretha Dykes  Encounter Date: 07/04/2016      PT End of Session - 07/04/16 1235    Visit Number 37   Number of Visits 19   Date for PT Re-Evaluation 08/03/16   PT Start Time 1120   PT Stop Time 1200   PT Time Calculation (min) 40 min   Activity Tolerance Patient tolerated treatment well   Behavior During Therapy [facility] for tasks assessed/performed      Past Medical History:  Diagnosis Date  . Hypertension   . Stroke [facility])     No past surgical history on file.  There were no vitals filed for this visit.      Subjective Assessment - 07/04/16 1234    Subjective Patient was able to get in the ring this weekend, reports things went well and he is much more confident in his knee for change of direction and agility based activities.    Limitations Walking;Lifting   How long can you stand comfortably? 1/2 hour same as before the cva   How long can you walk comfortably? 30 mintues   Patient Stated Goals for his left leg to not be numb   Currently in Pain? --  Patient reports he may have landed wrong in his first session in the ring and has had some R sided low back pain     Observed video of patient in wrestling ring this weekend, he demonstrates improving SSC capability with plyometric transitions from R to L foot, however with lateral movements he continues to have difficulty coordinating laterally moving his LLE.   Observed patient complete 2 feet in, 2 feet out lateral movements with agility ladder. Patient noted to initially not flex his LLE in directional changes as much as RLE, with cuing and video feedback patient is able to progressively increase his knee flexion on LLE for  directional changes.   Lateral shuffling, noted to initially be in more extended position for shuffling to his L however with cuing he is able to progressively increase his knee flexion throughout the lateral shuffling -- compared to first session on video and notable change in knee flexion during power production and change of direction.   Squats- patient observed to flex through lumbar spine and shift to his R side. With cuing to sit back into chair he is able to complete with more of a hip hinge, decreased lumbar flexion. He was able to complete with 20# KB x 8 repetitions  Lunges with HHA x 8 bilaterally, cuing for stance width which was challenging for him.                             PT Education - 07/04/16 1235    Education provided Yes   Education Details Practice activities in an athletic position, squatting technique.    Person(s) Educated Patient   Methods Explanation;Demonstration;Verbal cues;Handout   Comprehension Returned demonstration;Verbalized understanding             PT Long Term Goals - 06/22/16 1306      PT LONG TERM GOAL #1   Title Patient will increase BLE gross strength to 4+/5 as to improve functional strength for independent gait, increased standing  tolerance and increased ADL ability.   Baseline See Treatment note for update on 06/20/16   Time 12   Period Weeks   Status Partially Met     PT LONG TERM GOAL #2   Title Patient will reduce timed up and go to <11 seconds to reduce fall risk and demonstrate improved transfer/gait ability.   Time 12   Period Weeks   Status On-going     PT LONG TERM GOAL #3   Title Patient will be independent with ascend/descend 12 steps using single UE in step over step pattern without LOB.   Baseline See Treatment note on 06/20/16    Time 12   Period Weeks   Status Achieved     PT LONG TERM GOAL #4   Title Patient will increase six minute walk test distance to >1200 for progression to community  ambulator and improve gait ability   Time 12   Status Achieved     PT LONG TERM GOAL #5   Title Patient will be able to run for 5 minutes at a slow jog without ankle swelling following the run.    Baseline Patient is able to jog in clinic, but less than 5 minutes, noted to have decreased L arm swing but less "limping"   Time 12   Status Partially Met               Plan - 07/04/16 1236    Clinical Impression Statement Patient initially shows difficulty with flexing LLE for change of direction and force absorption, with cuing he improves this and reports feeling much more confident in his knee now than in initial session with this therapist. He is noted to have increasing levels of knee flexion during lateral shuffling on video from initial session in January.    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    PT Home Exercise Plan Squats, Lunges, depth landings, agility ladder drills.    Consulted and Agree with Plan of Care Patient      Patient will benefit from skilled therapeutic intervention in order to improve the following deficits and impairments:  Decreased balance, Impaired sensation, Obesity, Decreased activity tolerance, Decreased coordination, Decreased strength, Cardiopulmonary status limiting activity  Visit Diagnosis: Difficulty in walking, not elsewhere classified  Muscle weakness (generalized)  Other lack of coordination     Problem List There are no active problems to display for this patient.  Royce Macadamia PT, DPT, CSCS    07/04/2016, 12:42 PM  [place] [facility] [address], [place], [address] Phone: [phone]   Fax:  [phone]  Name: Colter Magowan MRN: [id number] Date of Birth: [birth year]

## 2016-07-07 ENCOUNTER — Ambulatory Visit: Payer: BC Managed Care – PPO | Admitting: Physical Therapy

## 2016-07-07 DIAGNOSIS — M6281 Muscle weakness (generalized): Secondary | ICD-10-CM

## 2016-07-07 DIAGNOSIS — R278 Other lack of coordination: Secondary | ICD-10-CM

## 2016-07-07 DIAGNOSIS — R262 Difficulty in walking, not elsewhere classified: Secondary | ICD-10-CM

## 2016-07-07 NOTE — Therapy (Signed)
[place] [facility] [address] [place], [address] Phone: [phone]   Fax:  [phone]  Physical Therapy Treatment  Patient Details  Name: Dalton Mathis MRN: [id number] Date of Birth: 1965-09-08 Referring Provider: Leretha Dykes  Encounter Date: 07/07/2016      PT End of Session - 07/07/16 1212    Visit Number 38   Number of Visits 28   Date for PT Re-Evaluation 08/03/16   PT Start Time 0900   PT Stop Time 0945   PT Time Calculation (min) 45 min   Activity Tolerance Patient tolerated treatment well   Behavior During Therapy [facility] for tasks assessed/performed      Past Medical History:  Diagnosis Date  . Hypertension   . Stroke [facility])     No past surgical history on file.  There were no vitals filed for this visit.      Subjective Assessment - 07/07/16 1210    Subjective Patient got an injection for his back pain, reports he is not having the spasms he was having last week.    Limitations Walking;Lifting   How long can you stand comfortably? 1/2 hour same as before the cva   How long can you walk comfortably? 30 mintues   Patient Stated Goals for his left leg to not be numb   Currently in Pain? No/denies      Y-star balance  L stance  Anterior 36 Posterior Lateral - 84 Posterior Medial - 34  R Stance  Anterior - 20 Posterior Lateral - 72 Posterior Medial - 20   Lunges with and without HHA (without HHA noted to have significant lateral postural sway) opted to perform   Drop Landings -- noted to lean to R on initial landing, weight shifts to R side, R foot hits first in toe to heel manner while LLE lands in midfoot with decreased knee flexion/hip flexion.   LESS - R foot lands first, trunk side flexed, excessive trunk flexion R foot lands toe to heel, L foot lands in midfoot.   BOSU -- with blue side up performing standing hip abductions/marching with HHA as needed. 3 rounds x  12 repetitions   Lateral hops with LLE and HHA support 5 sets x 5 repetitions with cuing for flexing through hip/knee for softer landings.                             PT Education - 07/07/16 1211    Education provided Yes   Education Details Jumping/landing mechanics, scores indicative of high risk of LE injury.   Person(s) Educated Patient   Methods Explanation;Demonstration   Comprehension Verbalized understanding;Returned demonstration             PT Long Term Goals - 06/22/16 1306      PT LONG TERM GOAL #1   Title Patient will increase BLE gross strength to 4+/5 as to improve functional strength for independent gait, increased standing tolerance and increased ADL ability.   Baseline See Treatment note for update on 06/20/16   Time 12   Period Weeks   Status Partially Met     PT LONG TERM GOAL #2   Title Patient will reduce timed up and go to <11 seconds to reduce fall risk and demonstrate improved transfer/gait ability.   Time 12   Period Weeks   Status On-going     PT LONG TERM GOAL #3  Title Patient will be independent with ascend/descend 12 steps using single UE in step over step pattern without LOB.   Baseline See Treatment note on 06/20/16    Time 12   Period Weeks   Status Achieved     PT LONG TERM GOAL #4   Title Patient will increase six minute walk test distance to >1200 for progression to community ambulator and improve gait ability   Time 12   Status Achieved     PT LONG TERM GOAL #5   Title Patient will be able to run for 5 minutes at a slow jog without ankle swelling following the run.    Baseline Patient is able to jog in clinic, but less than 5 minutes, noted to have decreased L arm swing but less "limping"   Time 12   Status Partially Met               Plan - 07/07/16 1213    Clinical Impression Statement Patient screened on LESS and Y star balance, both indicative of significant asymmetries from R to L LEs and  high risk for musculoskeletal injury in sporting/cutting activities. He continues to have higher level NM deficits, improving in symmetry on videofeedback  on TG jumping tasks from previous sessions.    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    PT Home Exercise Plan Squats, Lunges, depth landings, agility ladder drills.    Consulted and Agree with Plan of Care Patient      Patient will benefit from skilled therapeutic intervention in order to improve the following deficits and impairments:  Decreased balance, Impaired sensation, Obesity, Decreased activity tolerance, Decreased coordination, Decreased strength, Cardiopulmonary status limiting activity  Visit Diagnosis: Difficulty in walking, not elsewhere classified  Muscle weakness (generalized)  Other lack of coordination     Problem List There are no active problems to display for this patient.  Royce Macadamia PT, DPT, CSCS    07/07/2016, 12:32 PM  [facility] [facility] [address], [place], [address] Phone: [phone]   Fax:  [phone]  Name: Dalton Mathis MRN: [id number] Date of Birth: January 15, 1966

## 2016-07-07 NOTE — Patient Instructions (Signed)
L stance  Anterior 36 Posterior Lateral - 84 Posterior Medial - 34  R Stance  Anterior - 20 Posterior Lateral - 72 Posterior Medial - 20   Lunges   Drop Landings   LESS - R foot lands first, trunk side flexed, excessive trunk flexion R foot lands toe to heel, L foot lands in midfoot.   BOSU   Lateral hops

## 2016-07-11 ENCOUNTER — Ambulatory Visit: Payer: BC Managed Care – PPO | Admitting: Physical Therapy

## 2016-07-11 DIAGNOSIS — R278 Other lack of coordination: Secondary | ICD-10-CM

## 2016-07-11 DIAGNOSIS — M6281 Muscle weakness (generalized): Secondary | ICD-10-CM

## 2016-07-11 DIAGNOSIS — R262 Difficulty in walking, not elsewhere classified: Secondary | ICD-10-CM | POA: Diagnosis not present

## 2016-07-11 NOTE — Therapy (Signed)
[place] [facility] [address], [place], [address] Phone: [phone]   Fax:  [phone]  Physical Therapy Treatment  Patient Details  Name: Dalton Mathis MRN: [id number] Date of Birth: Oct 14, 1965 Referring Provider: Leretha Dykes  Encounter Date: 07/11/2016      PT End of Session - 07/11/16 1649    Visit Number 39   Number of Visits 73   Date for PT Re-Evaluation 08/03/16   PT Start Time 1601   PT Stop Time 1644   PT Time Calculation (min) 43 min   Activity Tolerance Patient tolerated treatment well   Behavior During Therapy [facility] for tasks assessed/performed      Past Medical History:  Diagnosis Date  . Hypertension   . Stroke [facility])     No past surgical history on file.  There were no vitals filed for this visit.      Subjective Assessment - 07/11/16 1621    Subjective Patient reports some mild R sided lower back pain, he reports he is starting to have more sensation in his LLE and is aware now of going down curbs and landing toe to heel and going up/down hill.    Limitations Walking;Lifting   How long can you stand comfortably? 1/2 hour same as before the cva   How long can you walk comfortably? 30 mintues   Patient Stated Goals for his left leg to not be numb   Currently in Pain? Yes  Reports mild pain in his lower back     Jogging analysis -- noted to have decreased L UE flexion/extension ROM and increased tone on LUE. Otherwise no spatio-temporal deficits observed at slow jogging speed.   L5 mobilizations (grade III) -- 2 bouts x 2 minutes (reported relief of symptoms)   BOSU single leg balance with catching - challenging for trunk control, provided cuing for maintaining even shoulders which improved performance (decreased reliance on use of hands to catch on TM railing).   Lunges (much more difficult on LLE for balance) -- he does have pain due to ingrown toenail, noted to have  more trunkal displacement in lunges when LLE is anterior, likely a result of decreased pelvic and femoral control.   Single leg squats on TG at level 24 x 12, x 10 x 10  Drop jump analysis -- noted to continue to land with asymmetrical timing on R and L feet (R foot first) causing his weight to shift to the R.   Performed single leg hops with double leg landings, progressing to double leg hops with double leg landings, and L leg hops to R leg landing at level 24 x 3 sets x 5 repetitions in each set. Continuing to note very slight decrease in full extension in power generation phase on LLE relative to RLE and decreased control (increased flexion/extension excursion) of LLE in flight phase. He was able to respond to cuing to land toe to heel, as he had been landing in flat foot.                            PT Education - 07/11/16 1648    Education provided Yes   Education Details Progression with jogging mechanics and landing mechanics.    Person(s) Educated Patient   Methods Explanation;Demonstration   Comprehension Verbalized understanding;Returned demonstration             PT Long Term Goals - 06/22/16  [place] #1   Title Patient will increase BLE gross strength to 4+/5 as to improve functional strength for independent gait, increased standing tolerance and increased ADL ability.   Baseline See Treatment note for update on 06/20/16   Time 12   Period Weeks   Status Partially Met     PT LONG TERM GOAL #2   Title Patient will reduce timed up and go to <11 seconds to reduce fall risk and demonstrate improved transfer/gait ability.   Time 12   Period Weeks   Status On-going     PT LONG TERM GOAL #3   Title Patient will be independent with ascend/descend 12 steps using single UE in step over step pattern without LOB.   Baseline See Treatment note on 06/20/16    Time 12   Period Weeks   Status Achieved     PT LONG TERM GOAL #4   Title  Patient will increase six minute walk test distance to >1200 for progression to community ambulator and improve gait ability   Time 12   Status Achieved     PT LONG TERM GOAL #5   Title Patient will be able to run for 5 minutes at a slow jog without ankle swelling following the run.    Baseline Patient is able to jog in clinic, but less than 5 minutes, noted to have decreased L arm swing but less "limping"   Time 12   Status Partially Met               Plan - 07/11/16 1650    Clinical Impression Statement Patient demonstrates decreased L arm swing during jogging initially, though able to correct with cuing. He continues to demonstrate R sided preference in bilateral jump landing tasks, though able to correct with single leg hops on total gym and cuing to land toes first on LLE. Noted to get through symmetrical extension ROM during jumping on TG, though showed decreased control of LLE (more excursion in knee flexion during jumping). Progressing well with strengthening and plyometric/dynamic balance activities.    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    PT Home Exercise Plan Squats, Lunges, depth landings, agility ladder drills.    Consulted and Agree with Plan of Care Patient      Patient will benefit from skilled therapeutic intervention in order to improve the following deficits and impairments:  Decreased balance, Impaired sensation, Obesity, Decreased activity tolerance, Decreased coordination, Decreased strength, Cardiopulmonary status limiting activity  Visit Diagnosis: Difficulty in walking, not elsewhere classified  Muscle weakness  (generalized)  Other lack of coordination     Problem List There are no active problems to display for this patient.  Royce Macadamia PT, DPT, CSCS    07/11/2016, 5:24 PM  [place] [facility] [address], [place], [address] Phone: [phone]   Fax:  [phone]  Name: Dalton Mathis MRN: [id number] Date of Birth: 08-06-1965

## 2016-07-11 NOTE — Patient Instructions (Signed)
Jogging analysis   L5 mobilizations (grade III)  BOSU single leg balance with catching   Lunges (much more difficult on LLE for balance)   Single leg squats on TG at level 24 x 12, x 10 x 10  Drop jump analysis

## 2016-07-13 ENCOUNTER — Encounter: Payer: BC Managed Care – PPO | Admitting: Physical Therapy

## 2016-07-14 ENCOUNTER — Ambulatory Visit: Payer: BC Managed Care – PPO | Admitting: Physical Therapy

## 2016-07-14 DIAGNOSIS — M6281 Muscle weakness (generalized): Secondary | ICD-10-CM

## 2016-07-14 DIAGNOSIS — R262 Difficulty in walking, not elsewhere classified: Secondary | ICD-10-CM | POA: Diagnosis not present

## 2016-07-14 DIAGNOSIS — R278 Other lack of coordination: Secondary | ICD-10-CM

## 2016-07-14 NOTE — Therapy (Signed)
[place] [facility] [address] [place], [address] Phone: [phone]   Fax:  [phone]  Physical Therapy Treatment  Patient Details  Name: Dalton Mathis MRN: [id number] Date of Birth: 30-May-1965 Referring Provider: Leretha Dykes  Encounter Date: 07/14/2016      PT End of Session - 07/14/16 1640    Visit Number 40   Number of Visits 73   Date for PT Re-Evaluation 08/03/16   PT Start Time 1611   PT Stop Time 1634   PT Time Calculation (min) 23 min   Activity Tolerance Patient tolerated treatment well   Behavior During Therapy [facility] for tasks assessed/performed      Past Medical History:  Diagnosis Date  . Hypertension   . Stroke [facility])     No past surgical history on file.  There were no vitals filed for this visit.      Subjective Assessment - 07/14/16 1636    Subjective Patient reported low back pain after having a full body massage yesterday. He states that he took half of a muscle relaxer which improved his symptoms. After giving him prone on elbows exercises, his symptoms improved.     Limitations Walking;Lifting   How long can you stand comfortably? 1/2 hour same as before the cva   How long can you walk comfortably? 30 mintues   Patient Stated Goals for his left leg to not be numb   Currently in Pain? Yes  Pt reports moderate pain with a change in gait pattern due to his back.   Pain Location Back     TG Single leg squats x 10 on LLE, discontinued after patient began to discuss symptoms he was experiencing over the past few days.   Prone on elbows x 15 repetitions x 10-30" holds, patient reported relief of tightness around lumbar spine after completion. He reports reduced stiffness with extension, no symptoms or change with flexion, some mild tightness with standing rotations.                             PT Education - 07/14/16 1639    Education provided  Yes   Education Details Signs/symptoms consistent with muscle origin of pain. Performed prone on elbows as needed.    Person(s) Educated Patient   Methods Explanation;Demonstration   Comprehension Verbalized understanding;Returned demonstration             PT Long Term Goals - 06/22/16 1306      PT LONG TERM GOAL #1   Title Patient will increase BLE gross strength to 4+/5 as to improve functional strength for independent gait, increased standing tolerance and increased ADL ability.   Baseline See Treatment note for update on 06/20/16   Time 12   Period Weeks   Status Partially Met     PT LONG TERM GOAL #2   Title Patient will reduce timed up and go to <11 seconds to reduce fall risk and demonstrate improved transfer/gait ability.   Time 12   Period Weeks   Status On-going     PT LONG TERM GOAL #3   Title Patient will be independent with ascend/descend 12 steps using single UE in step over step pattern without LOB.   Baseline See Treatment note on 06/20/16    Time 12   Period Weeks   Status Achieved     PT LONG TERM GOAL #4   Title Patient  will increase six minute walk test distance to >1200 for progression to community ambulator and improve gait ability   Time 12   Status Achieved     PT LONG TERM GOAL #5   Title Patient will be able to run for 5 minutes at a slow jog without ankle swelling following the run.    Baseline Patient is able to jog in clinic, but less than 5 minutes, noted to have decreased L arm swing but less "limping"   Time 12   Status Partially Met               Plan - 07/14/16 1641    Clinical Impression Statement Patient walked into the clinic with altered gait pattern. He stated that after his full body massage he felt an increase pain which may be due to muscle spasm.  Extension exercises improved symptoms., educated to complete over the weekend, will progress as able next week.   Rehab Potential Good   PT Frequency 2x / week   PT Duration  12 weeks   PT Treatment/Interventions Therapeutic exercise;Balance training;Neuromuscular re-education;Therapeutic activities;Gait training;Manual techniques   PT Next Visit Plan Continue strength and balance, utilizing more dynamic and sport-specific activities to work towards goal of returning to wrestling.    PT Home Exercise Plan Squats, Lunges, depth landings, agility ladder drills.    Consulted and Agree with Plan of Care Patient      Patient will benefit from skilled therapeutic intervention in order to improve the following deficits and impairments:  Decreased balance, Impaired sensation, Obesity, Decreased activity tolerance, Decreased coordination, Decreased strength, Cardiopulmonary status limiting activity  Visit Diagnosis: Difficulty in walking, not elsewhere classified  Muscle weakness (generalized)  Other lack of coordination     Problem List There are no active problems to display for this patient.  Royce Macadamia PT, DPT, CSCS    07/14/2016, 4:44 PM  [facility] [facility] [address] [place], [address] Phone: [phone]   Fax:  [phone]  Name: Dalton Mathis MRN: [id number] Date of Birth: Jan 15, 1966

## 2016-07-18 ENCOUNTER — Ambulatory Visit: Payer: BC Managed Care – PPO | Admitting: Physical Therapy

## 2016-07-18 DIAGNOSIS — R262 Difficulty in walking, not elsewhere classified: Secondary | ICD-10-CM

## 2016-07-18 DIAGNOSIS — R278 Other lack of coordination: Secondary | ICD-10-CM

## 2016-07-18 DIAGNOSIS — M6281 Muscle weakness (generalized): Secondary | ICD-10-CM

## 2016-07-18 NOTE — Therapy (Signed)
[place] [facility] [address], [place], [address] Phone: [phone]   Fax:  [phone]  Physical Therapy Treatment  Patient Details  Name: Dalton Mathis MRN: [id number] Date of Birth: January 28, 1966 Referring Provider: Leretha Dykes  Encounter Date: 07/18/2016      PT End of Session - 07/18/16 1045    Visit Number 41   Number of Visits 29   Date for PT Re-Evaluation 08/03/16   PT Start Time 0950   PT Stop Time 1040   PT Time Calculation (min) 50 min   Activity Tolerance Patient tolerated treatment well   Behavior During Therapy [facility] for tasks assessed/performed      Past Medical History:  Diagnosis Date  . Hypertension   . Stroke [facility])     No past surgical history on file.  There were no vitals filed for this visit.      Subjective Assessment - 07/18/16 0956    Subjective Patient did some activity in the ring this weekend which went well. His back and knee symptoms are much improved from last week.    Limitations Walking;Lifting   How long can you stand comfortably? 1/2 hour same as before the cva   How long can you walk comfortably? 30 mintues   Patient Stated Goals for his left leg to not be numb   Currently in Pain? No/denies      Single leg BOSU step ups x 20 repetitions (cuing for decreasing lateral instability, holding hip flexion for 1-2").  BOSU squats -- x 10 repetitions notable for increasing wobble initially until PT cued to maintain weight evenly, keeping chest upright.   Lateral shuffling (added in grey band for resistance)  -- notable for excessive extension in trunk initially, cued to flex through his LEs which improved change of direction (2 feet on ground, no lateral trunk lean as initially noted). 5 rounds x 2 bouts of lateral   Lunges on BOSU x 10 (cued to maintain shoulders level to decrease lateral loss of balance).   Lunge stance with push pass with 2kg ball x 8  repetitions (cued to produce more power, then decelerate in 2 steps ( he reports this mimics his work demands, appropriately challenging for balance both with power production and deceleration.                            PT Education - 07/18/16 1044    Education provided Yes   Education Details Will meet at his gym on Wednesday night to go over gym based exercise/rehab program.    Person(s) Educated Patient   Methods Explanation;Demonstration;Verbal cues   Comprehension Returned demonstration;Verbalized understanding             PT Long Term Goals - 06/22/16 1306      PT LONG TERM GOAL #1   Title Patient will increase BLE gross strength to 4+/5 as to improve functional strength for independent gait, increased standing tolerance and increased ADL ability.   Baseline See Treatment note for update on 06/20/16   Time 12   Period Weeks   Status Partially Met     PT LONG TERM GOAL #2   Title Patient will reduce timed up and go to <11 seconds to reduce fall risk and demonstrate improved transfer/gait ability.   Time 12   Period Weeks   Status On-going     PT LONG TERM GOAL #3  Title Patient will be independent with ascend/descend 12 steps using single UE in step over step pattern without LOB.   Baseline See Treatment note on 06/20/16    Time 12   Period Weeks   Status Achieved     PT LONG TERM GOAL #4   Title Patient will increase six minute walk test distance to >1200 for progression to community ambulator and improve gait ability   Time 12   Status Achieved     PT LONG TERM GOAL #5   Title Patient will be able to run for 5 minutes at a slow jog without ankle swelling following the run.    Baseline Patient is able to jog in clinic, but less than 5 minutes, noted to have decreased L arm swing but less "limping"   Time 12   Status Partially Met               Plan - 07/18/16 1045    Clinical Impression Statement Patient is progressing well with  dynamic single leg balance, and power production/absorption activities that mimic his work related activities. He does continue to have medial/lateral loss of balance when performed without external stimulus, however when therapist mocks opponent or provides ball to toss, patient demonstrates improved trunk/core control. Will meet at his gym on Wednesday to go over more functional/work related gym based program.    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    PT Home Exercise Plan Squats, Lunges, depth landings, agility ladder drills.    Consulted and Agree with Plan of Care Patient      Patient will benefit from skilled therapeutic intervention in order to improve the following deficits and impairments:  Decreased balance, Impaired sensation, Obesity, Decreased activity tolerance, Decreased coordination, Decreased strength, Cardiopulmonary status limiting activity  Visit Diagnosis: Difficulty in walking, not elsewhere classified  Muscle weakness (generalized)  Other lack of coordination     Problem List There are no active problems to display for this patient.  Royce Macadamia PT, DPT, CSCS    07/18/2016, 10:50 AM  [place] [facility] [address] [place], [address] Phone: [phone]   Fax:  [phone]  Name: Dalton Mathis MRN: [id number] Date of Birth: 12-19-1965

## 2016-07-18 NOTE — Patient Instructions (Signed)
Single leg BOSU step ups   BOSU squats   Lateral shuffling (added in grey band for resistance)

## 2016-07-21 ENCOUNTER — Ambulatory Visit: Payer: BC Managed Care – PPO | Attending: Physical Medicine and Rehabilitation | Admitting: Physical Therapy

## 2016-07-21 DIAGNOSIS — M6281 Muscle weakness (generalized): Secondary | ICD-10-CM | POA: Insufficient documentation

## 2016-07-21 DIAGNOSIS — R278 Other lack of coordination: Secondary | ICD-10-CM | POA: Insufficient documentation

## 2016-07-21 DIAGNOSIS — R262 Difficulty in walking, not elsewhere classified: Secondary | ICD-10-CM | POA: Insufficient documentation

## 2016-07-21 NOTE — Therapy (Signed)
[facility] [address] [place], [address] Phone: [phone]   Fax:  [phone]  Physical Therapy Treatment  Patient Details  Name: Dalton Mathis MRN: [id number] Date of Birth: [birth year] Referring Provider: Leretha Dykes  Encounter Date: 07/21/2016      PT End of Session - 07/21/16 1113    Visit Number 42   Number of Visits 71   Date for PT Re-Evaluation 08/03/16   PT Start Time 1033   PT Stop Time 1103   PT Time Calculation (min) 30 min   Activity Tolerance Patient tolerated treatment well   Behavior During Therapy [facility] for tasks assessed/performed      Past Medical History:  Diagnosis Date  . Hypertension   . Stroke [facility])     No past surgical history on file.  There were no vitals filed for this visit.      Subjective Assessment - 07/21/16 1109    Subjective Patient completed gym routine with therapist last night, he is much more confident with stepping down curbs and performing HEP.    Limitations Walking;Lifting   How long can you stand comfortably? 1/2 hour same as before the cva   How long can you walk comfortably? 30 mintues   Patient Stated Goals for his left leg to not be numb   Currently in Pain? No/denies      Y Star Balance  L stance - lateral - 90 Medial - 86 Anterior - 54  R stance  Lateral -100 Medial - 63 Anterior -55  Double leg jumps to blocks/platform, from platform -- continued to note on video playback that patient shifts onto his R side to initiate jump and initiate landing, trunk flexed excessively. Feet landing at the same time appropriately, toe to heel landings, similar height noted during jump of bilateral LEs  Single leg jumps to blocks -- continues to demonstrate decreased power output from LLE secondary to weakness/decreased sensation. Notable for improvement of landing mechanics on single LE (increased knee/hip flexion excursion).    Provided HEP for patient after observing him at [employer] the night before. HEP includes step ups, 2 and 1 legged hops, squats, deadlifts, kettlebell swings, palloff press, chops with cable.                            PT Education - 07/21/16 1111    Education provided Yes   Education Details Provided gym routine and will have patient follow up for repeat testing and progressions in 3 weeks.    Person(s) Educated Patient   Methods Explanation;Demonstration;Handout   Comprehension Verbalized understanding;Returned demonstration             PT Long Term Goals - 06/22/16 1306      PT LONG TERM GOAL #1   Title Patient will increase BLE gross strength to 4+/5 as to improve functional strength for independent gait, increased standing tolerance and increased ADL ability.   Baseline See Treatment note for update on 06/20/16   Time 12   Period Weeks   Status Partially Met     PT LONG TERM GOAL #2   Title Patient will reduce timed up and go to <11 seconds to reduce fall risk and demonstrate improved transfer/gait ability.   Time 12   Period Weeks   Status On-going     PT LONG TERM GOAL #3   Title Patient will be independent with  ascend/descend 12 steps using single UE in step over step pattern without LOB.   Baseline See Treatment note on 06/20/16    Time 12   Period Weeks   Status Achieved     PT LONG TERM GOAL #4   Title Patient will increase six minute walk test distance to >1200 for progression to community ambulator and improve gait ability   Time 12   Status Achieved     PT LONG TERM GOAL #5   Title Patient will be able to run for 5 minutes at a slow jog without ankle swelling following the run.    Baseline Patient is able to jog in clinic, but less than 5 minutes, noted to have decreased L arm swing but less "limping"   Time 12   Status Partially Met               Plan - 07/21/16 1113    Clinical Impression Statement Patient  demonstrates excellent improvement and near symmetry in all directions of the Y star balance, he does continue to shift his weight to the R on double leg jumps, deficit in single leg hop power, trunk control on single leg jumps. Overall he is progressing quite well, provided with NM trianing program and strengthening program with follow up in 3 weeks for advancement.    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    PT Home Exercise Plan NM training program with squats, deadlifts, kettlebell swings, cable rotary strengthening as tolerated.    Consulted and Agree with Plan of Care Patient      Patient will benefit from skilled therapeutic intervention in order to improve the following deficits and impairments:  Decreased balance, Impaired sensation, Obesity, Decreased activity tolerance, Decreased coordination, Decreased strength, Cardiopulmonary status limiting activity  Visit Diagnosis: Difficulty in walking, not elsewhere classified  Muscle weakness (generalized)  Other lack of coordination     Problem List There are no active problems to display for this patient.  Royce Macadamia PT, DPT, CSCS    07/21/2016, 11:17 AM  [facility] [address] [place], [address] Phone: [phone]   Fax:  [phone]  Name: Dalton Mathis MRN: [id number] Date of Birth: [birth year]

## 2016-07-21 NOTE — Patient Instructions (Signed)
Y Star Balance   Double leg jumps to blocks/platform, from platform   Single leg jumps to blocks

## 2016-08-11 ENCOUNTER — Ambulatory Visit: Payer: BC Managed Care – PPO | Admitting: Physical Therapy

## 2016-08-11 DIAGNOSIS — R278 Other lack of coordination: Secondary | ICD-10-CM

## 2016-08-11 DIAGNOSIS — R262 Difficulty in walking, not elsewhere classified: Secondary | ICD-10-CM | POA: Diagnosis not present

## 2016-08-11 DIAGNOSIS — M6281 Muscle weakness (generalized): Secondary | ICD-10-CM

## 2016-08-15 NOTE — Therapy (Signed)
[facility] [address], [place], [address] Phone: [phone]   Fax:  [phone]  Physical Therapy Treatment  Patient Details  Name: Dalton Mathis MRN: [id number] Date of Birth: [birth year] Referring Provider: Leretha Dykes  Encounter Date: 08/11/2016      PT End of Session - 08/15/16 0842    Visit Number 43   Number of Visits 10   Date for PT Re-Evaluation 08/03/16   PT Start Time 0945   PT Stop Time 1030   PT Time Calculation (min) 45 min   Activity Tolerance Patient tolerated treatment well   Behavior During Therapy [facility] for tasks assessed/performed      Past Medical History:  Diagnosis Date  . Hypertension   . Stroke [facility])     No past surgical history on file.  There were no vitals filed for this visit.      Subjective Assessment - 08/15/16 0846    Subjective Patient has been performing his HEP diligently, he's feeling pretty good in general. He had a singles match the other day, said it went quite well.    Limitations Walking;Lifting   How long can you stand comfortably? 1/2 hour same as before the cva   How long can you walk comfortably? 30 mintues   Patient Stated Goals for his left leg to not be numb   Currently in Pain? No/denies       Triple hop test  R Leg 93" 98"  L Leg  84" 85.5"  YBT  R stance  Medial 58 Anterior 42 Lateral 80  L Stance  Medial - 103 Anterior -59 Lateral 82   LESS Mild deficit in foot placement foot placement in landing L foot slightly in front of R foot. Knees and hips flexed appropriately. Trunk upright, minimal weight shifting to RLE. Ankles plantarflexed during initial landing, whole foot makes contact. On rebound land, similar mechanics noted. Stance width -- appropriate, no valgus demonstrated. Foot position in neutral. Overall impression = excellent.   Observed single leg stance on BOSU -- much improved from baseline, can  now perform stance for > 5" bilaterally, continues to have deficits if reaching posteriorly and laterally. To facilitate this had patient perform single leg stance on flat ground with object posteriorly and laterally x 5 minutes of practice/cuing.                           PT Education - 08/15/16 [phone]    Education provided Yes   Education Details Follow up in one month, will email patient appropriate progressions, but currently progressing nicely with all power/symmetry exercises.    Person(s) Educated Patient   Methods Explanation;Demonstration;Handout   Comprehension Verbalized understanding;Returned demonstration             PT Long Term Goals - 06/22/16 1306      PT LONG TERM GOAL #1   Title Patient will increase BLE gross strength to 4+/5 as to improve functional strength for independent gait, increased standing tolerance and increased ADL ability.   Baseline See Treatment note for update on 06/20/16   Time 12   Period Weeks   Status Partially Met     PT LONG TERM GOAL #2   Title Patient will reduce timed up and go to <11 seconds to reduce fall risk and demonstrate improved transfer/gait ability.   Time 12   Period Weeks   Status On-going  PT LONG TERM GOAL #3   Title Patient will be independent with ascend/descend 12 steps using single UE in step over step pattern without LOB.   Baseline See Treatment note on 06/20/16    Time 12   Period Weeks   Status Achieved     PT LONG TERM GOAL #4   Title Patient will increase six minute walk test distance to >1200 for progression to community ambulator and improve gait ability   Time 12   Status Achieved     PT LONG TERM GOAL #5   Title Patient will be able to run for 5 minutes at a slow jog without ankle swelling following the run.    Baseline Patient is able to jog in clinic, but less than 5 minutes, noted to have decreased L arm swing but less "limping"   Time 12   Status Partially Met                Plan - 08/15/16 0843    Clinical Impression Statement Patient is now able to perform single leg hops both vertically and horizontally on LLE. His demeanor and affect have made large improvements and patient generally seems to have returned to his "normal". He does continue to have deficits (10-15%) between limbs on return to sports testing. Also notable for trunkal deviations with higher level balance activities. Patient would benefit from follow up testing in 1 month, along with program designed to target rotary stability in single leg with higher level demands and progression of plyometric routine to increase his capacity to perform higher demand athletic activities.    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    PT Home Exercise Plan NM training program with squats, deadlifts, kettlebell swings, cable rotary strengthening as tolerated.    Consulted and Agree with Plan of Care Patient      Patient will benefit from skilled therapeutic intervention in order to improve the following deficits and impairments:  Decreased balance, Impaired sensation, Obesity, Decreased activity tolerance, Decreased coordination, Decreased strength, Cardiopulmonary status limiting activity  Visit Diagnosis: Difficulty in walking, not elsewhere classified  Muscle weakness (generalized)  Other lack of coordination     Problem List There are no active problems to display for this patient.  Royce Macadamia PT, DPT, CSCS    08/15/2016, 8:47 AM  [facility] [address], [place],  [address] Phone: [phone]   Fax:  [phone]  Name: Cristhian Vanhook MRN: [id number] Date of Birth: Mar 07, 1966

## 2016-09-12 ENCOUNTER — Ambulatory Visit: Payer: BC Managed Care – PPO | Attending: Physical Medicine and Rehabilitation | Admitting: Physical Therapy

## 2016-09-12 DIAGNOSIS — R278 Other lack of coordination: Secondary | ICD-10-CM | POA: Insufficient documentation

## 2016-09-12 DIAGNOSIS — R262 Difficulty in walking, not elsewhere classified: Secondary | ICD-10-CM | POA: Diagnosis present

## 2016-09-12 DIAGNOSIS — M6281 Muscle weakness (generalized): Secondary | ICD-10-CM | POA: Insufficient documentation

## 2016-09-12 NOTE — Therapy (Signed)
[facility] [address] [place], [address] Phone: [phone]   Fax:  [phone]  Physical Therapy Treatment  Patient Details  Name: Dalton Mathis MRN: [id number] Date of Birth: November 07, 1965 Referring Provider: Leretha Dykes  Encounter Date: 09/12/2016      PT End of Session - 09/12/16 1035    Visit Number 44   Number of Visits 53   Date for PT Re-Evaluation 08/03/16   PT Start Time 0953   PT Stop Time 1025   PT Time Calculation (min) 32 min   Activity Tolerance Patient tolerated treatment well   Behavior During Therapy [facility] for tasks assessed/performed      Past Medical History:  Diagnosis Date  . Hypertension   . Stroke [facility])     No past surgical history on file.  There were no vitals filed for this visit.      Subjective Assessment - 09/12/16 0956    Subjective Patient has had no issues with back pain, has been going to the gym x7 days per week. He had a match over the weekend and was able to move around the ring, bend his knee and get up off of the mat with no difficulty. His sensation is still off, but otherwise he feels his LLE is very strong.    Limitations Walking;Lifting   How long can you stand comfortably? 1/2 hour same as before the cva   How long can you walk comfortably? 30 mintues   Patient Stated Goals for his left leg to not be numb   Currently in Pain? No/denies      Y star Balance   L stance  Anterior - 65 Medial - 105 Lateral - 96  R Stance  Anterior - 68 Medial - 106 Lateral - 90.5  Drop Jump test from 2 blocks on riser - very mild weight shift to R, appropriate landing timing/coordination between R and LLE appropriate knee flexion on both landings. No valgus noted   Lateral agility assessed - initially noted to have more flexed R knee than L knee position, educated and provided video feedback for patient, which he was able to correct on subsequent  bout while being filmed.                            PT Education - 09/12/16 1112    Education provided Yes   Education Details PT will follow up with patient at the gym for HEP progression based on observed strengths and deficits from today's session.    Person(s) Educated Patient   Methods Explanation   Comprehension Verbalized understanding             PT Long Term Goals - 06/22/16 1306      PT LONG TERM GOAL #1   Title Patient will increase BLE gross strength to 4+/5 as to improve functional strength for independent gait, increased standing tolerance and increased ADL ability.   Baseline See Treatment note for update on 06/20/16   Time 12   Period Weeks   Status Partially Met     PT LONG TERM GOAL #2   Title Patient will reduce timed up and go to <11 seconds to reduce fall risk and demonstrate improved transfer/gait ability.   Time 12   Period Weeks   Status On-going     PT LONG TERM GOAL #3   Title Patient will be independent with  ascend/descend 12 steps using single UE in step over step pattern without LOB.   Baseline See Treatment note on 06/20/16    Time 12   Period Weeks   Status Achieved     PT LONG TERM GOAL #4   Title Patient will increase six minute walk test distance to >1200 for progression to community ambulator and improve gait ability   Time 12   Status Achieved     PT LONG TERM GOAL #5   Title Patient will be able to run for 5 minutes at a slow jog without ankle swelling following the run.    Baseline Patient is able to jog in clinic, but less than 5 minutes, noted to have decreased L arm swing but less "limping"   Time 12   Status Partially Met               Plan - 09/12/16 1108    Clinical Impression Statement Patient demonstrates significant improvement in symmetry in dynamic balance (Y-star balance), he is able to perform appropriate drop jump landing and demonstrates symmetry in lateral agility. He has been able to  wrestle with no deficits aside from sensory related defiicts. He has made significant improvement in balance and strength and appears appropriate for discharge with HEP.    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    PT Home Exercise Plan NM training program with squats, deadlifts, kettlebell swings, cable rotary strengthening as tolerated.    Consulted and Agree with Plan of Care Patient      Patient will benefit from skilled therapeutic intervention in order to improve the following deficits and impairments:  Decreased balance, Impaired sensation, Obesity, Decreased activity tolerance, Decreased coordination, Decreased strength, Cardiopulmonary status limiting activity  Visit Diagnosis: Difficulty in walking, not elsewhere classified  Muscle weakness (generalized)  Other lack of coordination     Problem List There are no active problems to display for this patient.  Royce Macadamia PT, DPT, CSCS    09/12/2016, 11:12 AM  [facility] [address] [place], [address] Phone: [phone]   Fax:  [phone]  Name: Dalton Mathis MRN: [id number] Date of Birth: [birth year]

## 2016-11-02 ENCOUNTER — Encounter: Payer: Self-pay | Admitting: *Deleted

## 2016-11-15 ENCOUNTER — Encounter: Payer: Self-pay | Admitting: General Surgery

## 2016-11-15 ENCOUNTER — Ambulatory Visit (INDEPENDENT_AMBULATORY_CARE_PROVIDER_SITE_OTHER): Payer: BC Managed Care – PPO | Admitting: General Surgery

## 2016-11-15 VITALS — BP 138/86 | HR 54 | Resp 12 | Ht 75.0 in | Wt 281.0 lb

## 2016-11-15 DIAGNOSIS — Z1211 Encounter for screening for malignant neoplasm of colon: Secondary | ICD-10-CM

## 2016-11-15 MED ORDER — POLYETHYLENE GLYCOL 3350 17 GM/SCOOP PO POWD: 1.0000 | Freq: Once | ORAL | 0 refills | Status: AC

## 2016-11-15 NOTE — Patient Instructions (Addendum)
Advised to limit use of Excedrin  Colonoscopy, Adult   A colonoscopy is an exam to look at the entire large intestine. During the exam, a lubricated, bendable tube is inserted into the anus and then passed into the rectum, colon, and other parts of the large intestine. A colonoscopy is often done as a part of normal colorectal screening or in response to certain symptoms, such as anemia, persistent diarrhea, abdominal pain, and blood in the stool. The exam can help screen for and diagnose medical problems, including:  Tumors.  Polyps.  Inflammation.  Areas of bleeding.  Tell a health care provider about:  Any allergies you have.  All medicines you are taking, including vitamins, herbs, eye drops, creams, and over-the-counter medicines.  Any problems you or family members have had with anesthetic medicines.  Any blood disorders you have.  Any surgeries you have had.  Any medical conditions you have.  Any problems you have had passing stool. What are the risks? Generally, this is a safe procedure. However, problems may occur, including:  Bleeding.  A tear in the intestine.  A reaction to medicines given during the exam.  Infection (rare).  What happens before the procedure? Eating and drinking restrictions Follow instructions from your health care provider about eating and drinking, which may include:  A few days before the procedure - follow a low-fiber diet. Avoid nuts, seeds, dried fruit, raw fruits, and vegetables.  1-3 days before the procedure - follow a clear liquid diet. Drink only clear liquids, such as clear broth or bouillon, black coffee or tea, clear juice, clear soft drinks or sports drinks, gelatin dessert, and popsicles. Avoid any liquids that contain red or purple dye.  On the day of the procedure - do not eat or drink anything during the 2 hours before the procedure, or within the time period that your health care provider recommends.  Bowel  prep If you were prescribed an oral bowel prep to clean out your colon:  Take it as told by your health care provider. Starting the day before your procedure, you will need to drink a large amount of medicated liquid. The liquid will cause you to have multiple loose stools until your stool is almost clear or light green.  If your skin or anus gets irritated from diarrhea, you may use these to relieve the irritation: ? Medicated wipes, such as adult wet wipes with aloe and vitamin E. ? A skin soothing-product like petroleum jelly.  If you vomit while drinking the bowel prep, take a break for up to 60 minutes and then begin the bowel prep again. If vomiting continues and you cannot take the bowel prep without vomiting, call your health care provider.  General instructions  Ask your health care provider about changing or stopping your regular medicines. This is especially important if you are taking diabetes medicines or blood thinners.  Plan to have someone take you home from the hospital or clinic. What happens during the procedure?  An IV tube may be inserted into one of your veins.  You will be given medicine to help you relax (sedative).  To reduce your risk of infection: ? Your health care team will wash or sanitize their hands. ? Your anal area will be washed with soap.  You will be asked to lie on your side with your knees bent.  Your health care provider will lubricate a long, thin, flexible tube. The tube will have a camera and a light on the  end.  The tube will be inserted into your anus.  The tube will be gently eased through your rectum and colon.  Air will be delivered into your colon to keep it open. You may feel some pressure or cramping.  The camera will be used to take images during the procedure.  A small tissue sample may be removed from your body to be examined under a microscope (biopsy). If any potential problems are found, the tissue will be sent to a lab  for testing.  If small polyps are found, your health care provider may remove them and have them checked for cancer cells.  The tube that was inserted into your anus will be slowly removed. The procedure may vary among health care providers and hospitals. What happens after the procedure?  Your blood pressure, heart rate, breathing rate, and blood oxygen level will be monitored until the medicines you were given have worn off.  Do not drive for 24 hours after the exam.  You may have a small amount of blood in your stool.  You may pass gas and have mild abdominal cramping or bloating due to the air that was used to inflate your colon during the exam.  It is up to you to get the results of your procedure. Ask your health care provider, or the department performing the procedure, when your results will be ready. This information is not intended to replace advice given to you by your health care provider. Make sure you discuss any questions you have with your health care provider. Document Released: 05/06/2000 Document Revised: 03/09/2016 Document Reviewed: 07/21/2015 Elsevier Interactive Patient Education  [employer]2018 [employer]  The patient is scheduled for a Colonoscopy at [facility] on 12/20/16. They are aware to call the day before to get their arrival time. He will only take his blood pressure medications by 8:00 am the morning of. He will bring his C-Pap machine with him. Miralax prescription has been sent into the patient's pharmacy. The patient is aware of date and instructions.

## 2016-11-15 NOTE — Progress Notes (Signed)
Patient ID: Dalton Mathis, male   DOB: [birth year], 51 y.o.   MRN: [id number]  Chief Complaint  Patient presents with  . Colonoscopy    HPI Dalton CarlLawrence Shryock is a 51 y.o. male.  Who presents for a colonoscopy discussion, none prior.  Denies any gastrointestinal issues. Bowels move regular using Colace and no bleeding noted. 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  HPI  Past Medical History:  Diagnosis Date  . Hypertension 2003  . Stroke (HCC) 12/2015    No past surgical history on file.  Family History  Problem Relation Age of Onset  . Colon cancer Neg Hx     Social History Social History  Substance Use Topics  . Smoking status: Never Smoker  . Smokeless tobacco: Never Used  . Alcohol use 1.2 oz/week    2 Cans of beer per week    No Known Allergies  Current Outpatient Prescriptions  Medication Sig Dispense Refill  . amlodipine-atorvastatin (CADUET) 10-10 MG tablet Take 1 tablet by mouth daily.    Marland Kitchen. aspirin-acetaminophen-caffeine (EXCEDRIN MIGRAINE) 250-250-65 MG tablet Take by mouth every 6 (six) hours as needed for headache.    . carvedilol (COREG) 25 MG tablet Take 25 mg by mouth 2 (two) times daily with a meal.    . docusate sodium (COLACE) 100 MG capsule Take 100 mg by mouth 2 (two) times daily.    . hydrALAZINE (APRESOLINE) 25 MG tablet Take 25 mg by mouth 3 (three) times daily.    Marland Kitchen. lisinopril (PRINIVIL,ZESTRIL) 20 MG tablet Take 20 mg by mouth daily.    . pravastatin (PRAVACHOL) 20 MG tablet Take 20 mg by mouth daily.    Marland Kitchen. loratadine (CLARITIN) 10 MG tablet Take 10 mg by mouth daily.    . polyethylene glycol powder (GLYCOLAX/MIRALAX) powder Take 255 g by mouth once. Mix whole container with 64 ounces of clear liquids 255 g 0   No current facility-administered medications for this visit.     Review of Systems Review of Systems  Blood pressure 138/86, pulse (!) 54, resp. rate 12, height 6\' 3"  (1.905 m), weight 281 lb (127.5  kg).  Physical Exam Physical Exam  Constitutional: He is oriented to person, place, and time. He appears well-developed and well-nourished.  HENT:  Mouth/Throat: Oropharynx is clear and moist.  Eyes: Conjunctivae are normal. No scleral icterus.  Neck: Neck supple.  Cardiovascular: Normal rate, regular rhythm and normal heart sounds.   Pulmonary/Chest: Effort normal and breath sounds normal.  Lymphadenopathy:    He has no cervical adenopathy.  Neurological: He is alert and oriented to person, place, and time.  Skin: Skin is warm and dry.  Skin tags on upper right back  Psychiatric: His behavior is normal.    Data Reviewed [facility] had CT dated 12/28/2015 described a right-sided intraparenchymal hemorrhage with effacement of the right lateral ventricle and 4 mm midline shift. 4.6 x 2.5 cm right-sided intraparenchymal hemorrhage extending over the right basal ganglia. No intraventricular extension.  01/05/2016 MRI showed no interval change. No evidence of aneurysm or high-grade stenosis on MRA.   Assessment    Candidate for screening colonoscopy.    Plan        Advised to limit use of Excedrin with caffeine recommended based on previous stroke..  Colonoscopy with possible biopsy/polypectomy prn: Information regarding the procedure, including its potential risks and complications (including but not limited to perforation of the bowel, which may require emergency surgery to repair, and bleeding) was verbally  given to the patient. Educational information regarding lower intestinal endoscopy was given to the patient. Written instructions for how to complete the bowel prep using Miralax were provided. The importance of drinking ample fluids to avoid dehydration as a result of the prep emphasized.  HPI, Physical Exam, Assessment and Plan have been scribed under the direction and in the presence of Earline Mayotte, MD.  Dorathy Daft, RN  I have completed the exam and reviewed the above  documentation for accuracy and completeness.  I agree with the above.  Museum/gallery conservator has been used and any errors in dictation or transcription are unintentional.  Donnalee Curry, M.D., F.A.C.S.  The patient is scheduled for a Colonoscopy at [facility] on 12/20/16. They are aware to call the day before to get their arrival time. He will only take his blood pressure medications by 8:00 am the morning of. He will bring his C-Pap machine with him. Miralax prescription has been sent into the patient's pharmacy. The patient is aware of date and instructions.  Documented by Sinda Du LPN    Earline Mayotte 11/15/2016, 9:13 PM

## 2016-12-20 ENCOUNTER — Ambulatory Visit: Payer: BC Managed Care – PPO | Admitting: Anesthesiology

## 2016-12-20 ENCOUNTER — Encounter: Payer: Self-pay | Admitting: *Deleted

## 2016-12-20 ENCOUNTER — Ambulatory Visit
Admission: RE | Admit: 2016-12-20 | Discharge: 2016-12-20 | Disposition: A | Discharge status: Home Health | Payer: BC Managed Care – PPO | Source: Ambulatory Visit | Attending: General Surgery | Admitting: General Surgery

## 2016-12-20 ENCOUNTER — Encounter
Admission: RE | Disposition: A | Discharge status: Home Health | Payer: Self-pay | Source: Ambulatory Visit | Attending: General Surgery

## 2016-12-20 DIAGNOSIS — Z79899 Other long term (current) drug therapy: Secondary | ICD-10-CM | POA: Diagnosis not present

## 2016-12-20 DIAGNOSIS — G4730 Sleep apnea, unspecified: Secondary | ICD-10-CM | POA: Diagnosis not present

## 2016-12-20 DIAGNOSIS — Q439 Congenital malformation of intestine, unspecified: Secondary | ICD-10-CM | POA: Diagnosis not present

## 2016-12-20 DIAGNOSIS — Z9989 Dependence on other enabling machines and devices: Secondary | ICD-10-CM | POA: Insufficient documentation

## 2016-12-20 DIAGNOSIS — Z8673 Personal history of transient ischemic attack (TIA), and cerebral infarction without residual deficits: Secondary | ICD-10-CM | POA: Insufficient documentation

## 2016-12-20 DIAGNOSIS — J449 Chronic obstructive pulmonary disease, unspecified: Secondary | ICD-10-CM | POA: Insufficient documentation

## 2016-12-20 DIAGNOSIS — I10 Essential (primary) hypertension: Secondary | ICD-10-CM | POA: Insufficient documentation

## 2016-12-20 DIAGNOSIS — Z1211 Encounter for screening for malignant neoplasm of colon: Secondary | ICD-10-CM | POA: Diagnosis not present

## 2016-12-20 HISTORY — PX: COLONOSCOPY WITH PROPOFOL: SHX5780

## 2016-12-20 SURGERY — COLONOSCOPY WITH PROPOFOL
Anesthesia: General

## 2016-12-20 MED ORDER — FENTANYL CITRATE (PF) 100 MCG/2ML IJ SOLN
INTRAMUSCULAR | Status: DC | PRN
  Administered 2016-12-20: 50 ug via INTRAVENOUS

## 2016-12-20 MED ORDER — MIDAZOLAM HCL 2 MG/2ML IJ SOLN
INTRAMUSCULAR | Status: AC
  Filled 2016-12-20: qty 2

## 2016-12-20 MED ORDER — MIDAZOLAM HCL 2 MG/2ML IJ SOLN
INTRAMUSCULAR | Status: DC | PRN
Start: 2016-12-20 — End: 2016-12-20
  Administered 2016-12-20: 2 mg via INTRAVENOUS

## 2016-12-20 MED ORDER — PROPOFOL 500 MG/50ML IV EMUL
INTRAVENOUS | Status: DC | PRN
  Administered 2016-12-20: 150 ug/kg/min via INTRAVENOUS

## 2016-12-20 MED ORDER — SODIUM CHLORIDE 0.9 % IV SOLN
INTRAVENOUS | Status: DC
  Administered 2016-12-20: 13:00:00 via INTRAVENOUS

## 2016-12-20 MED ORDER — PROPOFOL 10 MG/ML IV BOLUS
INTRAVENOUS | Status: AC
  Filled 2016-12-20: qty 20

## 2016-12-20 MED ORDER — FENTANYL CITRATE (PF) 100 MCG/2ML IJ SOLN
INTRAMUSCULAR | Status: AC
  Filled 2016-12-20: qty 2

## 2016-12-20 MED ORDER — EPHEDRINE SULFATE 50 MG/ML IJ SOLN
INTRAMUSCULAR | Status: DC | PRN
  Administered 2016-12-20 (×2): 10 mg via INTRAVENOUS

## 2016-12-20 NOTE — Anesthesia Preprocedure Evaluation (Signed)
Anesthesia Evaluation  Patient identified by MRN, date of birth, ID band Patient awake    Reviewed: Allergy & Precautions, NPO status , Patient's Chart, lab work & pertinent test results  History of Anesthesia Complications Negative for: history of anesthetic complications  Airway Mallampati: III  TM Distance: >3 FB Neck ROM: Full    Dental no notable dental hx.    Pulmonary sleep apnea and Continuous Positive Airway Pressure Ventilation , neg COPD,    breath sounds clear to auscultation- rhonchi (-) wheezing      Cardiovascular Exercise Tolerance: Good hypertension, Pt. on medications (-) CAD, (-) Past MI and (-) Cardiac Stents  Rhythm:Regular Rate:Normal - Systolic murmurs and - Diastolic murmurs    Neuro/Psych CVA, No Residual Symptoms negative psych ROS   GI/Hepatic negative GI ROS, Neg liver ROS,   Endo/Other  negative endocrine ROSneg diabetes  Renal/GU negative Renal ROS     Musculoskeletal negative musculoskeletal ROS (+)   Abdominal (+) + obese,   Peds  Hematology negative hematology ROS (+)   Anesthesia Other Findings Past Medical History: 2003: Hypertension 12/2015: Stroke (HCC)     Comment:  Right basal ganglia thought secondary to hypertension.   Reproductive/Obstetrics                             Anesthesia Physical Anesthesia Plan  ASA: II  Anesthesia Plan: General   Post-op Pain Management:    Induction: Intravenous  PONV Risk Score and Plan: 1 and Propofol infusion  Airway Management Planned: Natural Airway  Additional Equipment:   Intra-op Plan:   Post-operative Plan:   Informed Consent: I have reviewed the patients History and Physical, chart, labs and discussed the procedure including the risks, benefits and alternatives for the proposed anesthesia with the patient or authorized representative who has indicated his/her understanding and acceptance.    Dental advisory given  Plan Discussed with: CRNA and Anesthesiologist  Anesthesia Plan Comments:         Anesthesia Quick Evaluation

## 2016-12-20 NOTE — H&P (Signed)
  Dalton Mathis [id number] 09-13-1965     HPI: Candidate for screening colonoscopy. Tolerated prep well.   Prescriptions Prior to Admission  Medication Sig Dispense Refill Last Dose  . amlodipine-atorvastatin (CADUET) 10-10 MG tablet Take 1 tablet by mouth daily.   Taking  . aspirin-acetaminophen-caffeine (EXCEDRIN MIGRAINE) 250-250-65 MG tablet Take by mouth every 6 (six) hours as needed for headache.   Taking  . carvedilol (COREG) 25 MG tablet Take 25 mg by mouth 2 (two) times daily with a meal.   Taking  . docusate sodium (COLACE) 100 MG capsule Take 100 mg by mouth 2 (two) times daily.   Taking  . hydrALAZINE (APRESOLINE) 25 MG tablet Take 25 mg by mouth 3 (three) times daily.   Taking  . lisinopril (PRINIVIL,ZESTRIL) 20 MG tablet Take 20 mg by mouth daily.   Taking  . loratadine (CLARITIN) 10 MG tablet Take 10 mg by mouth daily.   Not Taking  . pravastatin (PRAVACHOL) 20 MG tablet Take 20 mg by mouth daily.   Taking   No Known Allergies Past Medical History:  Diagnosis Date  . Hypertension 2003  . Stroke [facility](HCC) 12/2015   Right basal ganglia thought secondary to hypertension.   No past surgical history on file. Social History   Social History  . Marital status: Single    Spouse name: N/A  . Number of children: N/A  . Years of education: N/A   Occupational History  . Not on file.   Social History Main Topics  . Smoking status: Never Smoker  . Smokeless tobacco: Never Used  . Alcohol use 1.2 oz/week    2 Cans of beer per week  . Drug use: No  . Sexual activity: Not on file   Other Topics Concern  . Not on file   Social History Narrative  . No narrative on file   Social History   Social History Narrative  . No narrative on file     ROS: Negative.     PE: HEENT: Negative. Lungs: Clear. Cardio: RR.  Assessment/Plan:  Proceed with planned endoscopy.  Earline MayotteByrnett, Jolanda Mccann W 12/20/2016   Assessment/Plan:  Proceed with planned endoscopy.

## 2016-12-20 NOTE — Anesthesia Procedure Notes (Signed)
Date/Time: 12/20/2016 1:20 PM Performed by: Henrietta HooverPOPE, Elizardo Chilson Pre-anesthesia Checklist: Patient identified, Emergency Drugs available, Suction available, Patient being monitored and Timeout performed Patient Re-evaluated:Patient Re-evaluated prior to induction Oxygen Delivery Method: Nasal cannula Induction Type: IV induction Placement Confirmation: positive ETCO2 Dental Injury: Teeth and Oropharynx as per pre-operative assessment

## 2016-12-20 NOTE — Anesthesia Post-op Follow-up Note (Cosign Needed)
Anesthesia QCDR form completed.        

## 2016-12-20 NOTE — Transfer of Care (Signed)
Immediate Anesthesia Transfer of Care Note  Patient: Dalton Mathis  Procedure(s) Performed: Procedure(s): COLONOSCOPY WITH PROPOFOL (N/A)  Patient Location: PACU  Anesthesia Type:General  Level of Consciousness: sedated  Airway & Oxygen Therapy: Patient Spontanous Breathing and Patient connected to nasal cannula oxygen  Post-op Assessment: Report given to RN and Post -op Vital signs reviewed and stable  Post vital signs: Reviewed and stable  Last Vitals:  Vitals:   12/20/16 1258 12/20/16 1405  BP: 131/82 96/70  Pulse: (!) 50 (!) 53  Resp: 18 16  Temp: 36.5 C (!) 36.1 C    Last Pain:  Vitals:   12/20/16 1405  TempSrc: Tympanic  PainSc: Asleep      Patients Stated Pain Goal: 0 (12/20/16 1258)  Complications: No apparent anesthesia complications

## 2016-12-20 NOTE — Op Note (Signed)
[facility] Gastroenterology Patient Name: Dalton Mathis Procedure Date: 12/20/2016 1:22 PM MRN: [id number] Account #: [id number] Date of Birth: 12-07-1965 Admit Type: Outpatient Age: 51 [facility] ROOM [facility] Gender: Male Note Status: Finalized Procedure:            Colonoscopy Indications:          Screening for colorectal malignant neoplasm Providers:            Earline MayotteJeffrey W. Byrnett, MD Referring MD:         Jillene Bucksenny C. Arlana Pouchate, MD (Referring MD) Medicines:            Monitored Anesthesia Care Complications:        No immediate complications. Procedure:            Pre-Anesthesia Assessment:                       - Prior to the procedure, a History and Physical was                        performed, and patient medications, allergies and                        sensitivities were reviewed. The patient's tolerance of                        previous anesthesia was reviewed.                       - The risks and benefits of the procedure and the                        sedation options and risks were discussed with the                        patient. All questions were answered and informed                        consent was obtained.                       After obtaining informed consent, the colonoscope was                        passed under direct vision. Throughout the procedure,                        the patient's blood pressure, pulse, and oxygen                        saturations were monitored continuously. The                        Colonoscope was introduced through the anus and                        advanced to the the cecum, identified by appendiceal                        orifice and ileocecal valve. The colonoscopy was  somewhat difficult due to a tortuous colon. Successful                        completion of the procedure was aided by using manual                        pressure. The patient tolerated the procedure well. The                   quality of the bowel preparation was good. Findings:      The entire examined colon appeared normal on direct and retroflexion       views. Impression:           - The entire examined colon is normal on direct and                        retroflexion views.                       - No specimens collected. Recommendation:       - Discharge patient to home (via wheelchair).                       - Repeat colonoscopy in 10 years for screening purposes. Procedure Code(s):    --- Professional ---                       [phone], Colonoscopy, flexible; diagnostic, including                        collection of specimen(s) by brushing or washing, when                        performed (separate procedure) Diagnosis Code(s):    --- Professional ---                       Z12.11, Encounter for screening for malignant neoplasm                        of colon CPT copyright 2016 [employer]. All rights reserved. The codes documented in this report are preliminary and upon coder review may  be revised to meet current compliance requirements. Earline MayotteJeffrey W. Byrnett, MD 12/20/2016 1:59:31 PM This report has been signed electronically. Number of Addenda: 0 Note Initiated On: 12/20/2016 1:22 PM Scope Withdrawal Time: 0 hours 12 minutes 16 seconds  Total Procedure Duration: 0 hours 27 minutes 42 seconds       [facility]

## 2016-12-21 ENCOUNTER — Encounter: Payer: Self-pay | Admitting: General Surgery

## 2016-12-21 NOTE — Anesthesia Postprocedure Evaluation (Signed)
Anesthesia Post Note  Patient: Dalton Mathis  Procedure(s) Performed: Procedure(s) (LRB): COLONOSCOPY WITH PROPOFOL (N/A)  Patient location during evaluation: Endoscopy Anesthesia Type: General Level of consciousness: awake and alert and oriented Pain management: pain level controlled Vital Signs Assessment: post-procedure vital signs reviewed and stable Respiratory status: spontaneous breathing, nonlabored ventilation and respiratory function stable Cardiovascular status: blood pressure returned to baseline and stable Postop Assessment: no signs of nausea or vomiting Anesthetic complications: no     Last Vitals:  Vitals:   12/20/16 1425 12/20/16 1435  BP: 109/80 128/84  Pulse: (!) 49 (!) 58  Resp: 15 18  Temp:      Last Pain:  Vitals:   12/20/16 1415  TempSrc:   PainSc: Asleep                 Rathana Viveros

## 2017-03-10 LAB — BASIC METABOLIC PANEL: Creatinine: 1.5 — AB (ref 0.6–1.3)

## 2017-03-10 LAB — COMPREHENSIVE METABOLIC PANEL: GFR calc Af Amer: 63

## 2018-02-02 LAB — BASIC METABOLIC PANEL
BUN: 12 (ref 4–21)
CO2: 25 — AB (ref 13–22)
Chloride: 104 (ref 99–108)
Creatinine: 1.6 — AB (ref 0.6–1.3)
Glucose: 98
Potassium: 4 (ref 3.4–5.3)
Sodium: 142 (ref 137–147)

## 2018-02-02 LAB — COMPREHENSIVE METABOLIC PANEL
Albumin: 4.3 (ref 3.5–5.0)
Calcium: 9.3 (ref 8.7–10.7)
GFR calc Af Amer: 59
Globulin: 2.9

## 2018-02-02 LAB — LIPID PANEL
Cholesterol: 135 (ref 0–200)
HDL: 41 (ref 35–70)
LDL Cholesterol: 74
Triglycerides: 98 (ref 40–160)

## 2018-02-02 LAB — HEPATIC FUNCTION PANEL
ALT: 16 (ref 10–40)
AST: 22 (ref 14–40)
Alkaline Phosphatase: 81 (ref 25–125)
Bilirubin, Total: 0.7

## 2019-03-11 LAB — LIPID PANEL
Cholesterol: 133 (ref 0–200)
HDL: 35 (ref 35–70)
LDL Cholesterol: 81
Triglycerides: 91 (ref 40–160)

## 2019-03-11 LAB — BASIC METABOLIC PANEL: Creatinine: 1.8 — AB (ref 0.6–1.3)

## 2019-03-11 LAB — COMPREHENSIVE METABOLIC PANEL: GFR calc Af Amer: 49

## 2019-04-15 LAB — COMPREHENSIVE METABOLIC PANEL: GFR calc Af Amer: 55

## 2019-04-15 LAB — BASIC METABOLIC PANEL: Creatinine: 1.6 — AB (ref 0.6–1.3)

## 2019-09-23 LAB — BASIC METABOLIC PANEL
BUN: 11 (ref 4–21)
CO2: 25 — AB (ref 13–22)
Chloride: 105 (ref 99–108)
Creatinine: 1.3 (ref 0.6–1.3)
Glucose: 93
Potassium: 3.7 (ref 3.4–5.3)
Sodium: 141 (ref 137–147)

## 2019-09-23 LAB — LIPID PANEL
Cholesterol: 134 (ref 0–200)
HDL: 36 (ref 35–70)
LDL Cholesterol: 82
Triglycerides: 81 (ref 40–160)

## 2019-09-23 LAB — COMPREHENSIVE METABOLIC PANEL
Albumin: 4.2 (ref 3.5–5.0)
Calcium: 8.7 (ref 8.7–10.7)
GFR calc Af Amer: 73
Globulin: 3

## 2019-09-23 LAB — HEPATIC FUNCTION PANEL
ALT: 8 — AB (ref 10–40)
AST: 16 (ref 14–40)
Alkaline Phosphatase: 82 (ref 25–125)
Bilirubin, Total: 0.7

## 2020-03-03 ENCOUNTER — Ambulatory Visit (INDEPENDENT_AMBULATORY_CARE_PROVIDER_SITE_OTHER): Payer: 59 | Admitting: Family Medicine

## 2020-03-03 ENCOUNTER — Other Ambulatory Visit: Payer: Self-pay

## 2020-03-03 ENCOUNTER — Encounter: Payer: Self-pay | Admitting: Family Medicine

## 2020-03-03 VITALS — BP 165/91 | HR 78 | Temp 97.8°F | Resp 16 | Ht 75.0 in | Wt 274.0 lb

## 2020-03-03 DIAGNOSIS — I10 Essential (primary) hypertension: Secondary | ICD-10-CM | POA: Diagnosis not present

## 2020-03-03 DIAGNOSIS — N183 Chronic kidney disease, stage 3 unspecified: Secondary | ICD-10-CM | POA: Insufficient documentation

## 2020-03-03 DIAGNOSIS — I129 Hypertensive chronic kidney disease with stage 1 through stage 4 chronic kidney disease, or unspecified chronic kidney disease: Secondary | ICD-10-CM | POA: Insufficient documentation

## 2020-03-03 DIAGNOSIS — E782 Mixed hyperlipidemia: Secondary | ICD-10-CM | POA: Diagnosis not present

## 2020-03-03 DIAGNOSIS — E669 Obesity, unspecified: Secondary | ICD-10-CM

## 2020-03-03 DIAGNOSIS — Z7689 Persons encountering health services in other specified circumstances: Secondary | ICD-10-CM | POA: Diagnosis not present

## 2020-03-03 DIAGNOSIS — Z23 Encounter for immunization: Secondary | ICD-10-CM

## 2020-03-03 DIAGNOSIS — I6930 Unspecified sequelae of cerebral infarction: Secondary | ICD-10-CM

## 2020-03-03 MED ORDER — HYDRALAZINE HCL 25 MG PO TABS: 75.0000 mg | ORAL_TABLET | Freq: Three times a day (TID) | ORAL | 5 refills | Status: DC

## 2020-03-03 MED ORDER — GABAPENTIN 100 MG PO CAPS: ORAL_CAPSULE | ORAL | 1 refills | Status: DC

## 2020-03-03 MED ORDER — LISINOPRIL 20 MG PO TABS: 20.0000 mg | ORAL_TABLET | Freq: Every day | ORAL | 1 refills | Status: DC

## 2020-03-03 MED ORDER — CARVEDILOL 25 MG PO TABS: 25.0000 mg | ORAL_TABLET | Freq: Two times a day (BID) | ORAL | 1 refills | Status: DC

## 2020-03-03 MED ORDER — ATORVASTATIN CALCIUM 20 MG PO TABS: 20.0000 mg | ORAL_TABLET | Freq: Every day | ORAL | 1 refills | Status: DC

## 2020-03-03 MED ORDER — AMLODIPINE BESYLATE 5 MG PO TABS: 5.0000 mg | ORAL_TABLET | Freq: Every day | ORAL | 1 refills | Status: DC

## 2020-03-03 NOTE — Assessment & Plan Note (Signed)
Controlled cholesterol on statin and lifestyle Last lipid panel approx 07/2019 need records  Plan: 1. Continue current meds - Atorvastatin 20mg  daily 2. Encourage improved lifestyle - low carb/cholesterol, reduce portion size, continue improving regular exercise  Future reconsider Aspirin for 2ndary prevention

## 2020-03-03 NOTE — Assessment & Plan Note (Signed)
Elevated BP today off medication >4 days ran out needs refills - Home BP readings  Complication history CVA   Plan:  1. Continue current BP regimen  Amlodipine 5mg  daily, Carvedilol 25mg  BID, Hydralazine 75mg  (x 3 of 25mg  tab) TID, Lisinopril 20mg  daily  - refilled 2. Encourage improved lifestyle - low sodium diet, regular exercise 3. Continue monitor BP outside office, bring readings to next visit, if persistently >140/90 or new symptoms notify office sooner

## 2020-03-03 NOTE — Progress Notes (Signed)
Subjective:    Patient ID: Dalton Mathis, male    DOB: 1965/12/16, 54 y.o.   MRN: [id number]  Dalton Mathis is a 54 y.o. male presenting on 03/03/2020 for Establish Care (stroke)  Previously with Dr Dewaine Oatsenny Tate for past 15 years, now insurance change, needs to relocate PCP.  HPI   History of CVA  History of CVA in 2017,  Reduced sensation of Left inner foot, and also residual numbness often in Left upper extremity - In past he was on trial of Lyrica, without significant improvement - He has not tried Gabapentin - He completed PT regimen post-CVA. He was transitioned to Sports Rehab PT after completed initial PT  CHRONIC HTN / CKD Reports issue with previous insurance ran out of medication for 4 days prior to this visit. Last lab showed elevated creatinine Current Meds - Amlodipine 5mg  daily, Carvedilol 25mg  BID, Hydralazine 75mg  (x 3 of 25mg  tab) TID, Lisinopril 20mg  daily  Reports good compliance, did NOT take meds today. Tolerating well, w/o complaints. Denies CP, dyspnea, HA, edema, dizziness / lightheadedness  HYPERLIPIDEMIA: - Reports no concerns. 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 - Walking exercise regularly  Health Maintenance:  UTD COVID Vaccine  Due for Flu Shot, will receive today   Completed Colonoscopy 12/20/2016, first screening, Dr Lemar LivingsByrnett, result with 0 polyps, return in 10 years.  Depression screen PHQ 2/9 03/03/2020  Decreased Interest 0  Down, Depressed, Hopeless 0  PHQ - 2 Score 0    Past Medical History:  Diagnosis Date  . Allergy   . Hyperlipidemia   . Hypertension 2003  . Sleep apnea   . Stroke [facility](H[facility]) 12/2015   Right basal ganglia thought secondary to hypertension.   Past Surgical History:    Procedure Laterality Date  . COLONOSCOPY WITH PROPOFOL N/A 12/20/2016   Procedure: COLONOSCOPY WITH PROPOFOL;  Surgeon: Earline MayotteByrnett, Jeffrey W, MD;  Location: [facility] ENDOSCOPY;  Service: Endoscopy;  Laterality: N/A;   Social History   Socioeconomic History  . Marital status: Single    Spouse name: Not on file  . Number of children: Not on file  . Years of education: Not on file  . Highest education level: Not on file  Occupational History  . Not on file  Tobacco Use  . Smoking status: Never Smoker  . Smokeless tobacco: Never Used  Substance and Sexual Activity  . Alcohol use: Yes    Alcohol/week: 2.0 standard drinks    Types: 2 Cans of beer per week  . Drug use: No  . Sexual activity: Not on file  Other Topics Concern  . Not on file  Social History Narrative  . Not on file   Social Determinants of Health   Financial Resource Strain:   . Difficulty of Paying Living Expenses: Not on file  Food Insecurity:   . Worried About Programme researcher, broadcasting/film/videounning Out of Food in the Last Year: Not on file  . Ran Out of Food in the Last Year: Not on file  Transportation Needs:   . Lack of Transportation (Medical): Not on file  . Lack of Transportation (Non-Medical): Not on file  Physical Activity:   . Days of Exercise per Week: Not on file  . Minutes of Exercise per Session: Not on file  Stress:   . Feeling of Stress :  Not on file  Social Connections:   . Frequency of Communication with Friends and Family: Not on file  . Frequency of Social Gatherings with Friends and Family: Not on file  . Attends Religious Services: Not on file  . Active Member of Clubs or Organizations: Not on file  . Attends Banker Meetings: Not on file  . Marital Status: Not on file  Intimate Partner Violence:   . Fear of Current or Ex-Partner: Not on file  . Emotionally Abused: Not on file  . Physically Abused: Not on file  . Sexually Abused: Not on file   Family History  Problem Relation Age of Onset  . Colon  cancer Neg Hx    Current Outpatient Medications on File Prior to Visit  Medication Sig  . aspirin-acetaminophen-caffeine (EXCEDRIN MIGRAINE) 250-250-65 MG tablet Take by mouth every 6 (six) hours as needed for headache.  . docusate sodium (COLACE) 100 MG capsule Take 100 mg by mouth 2 (two) times daily.  Marland Kitchen loratadine (CLARITIN) 10 MG tablet Take 10 mg by mouth daily.   No current facility-administered medications on file prior to visit.    Review of Systems Per HPI unless specifically indicated above      Objective:    BP (!) 165/91   Pulse 78   Temp 97.8 F (36.6 C) (Temporal)   Resp 16   Ht 6\' 3"  (1.905 m)   Wt 274 lb (124.3 kg)   SpO2 99%   BMI 34.25 kg/m   Wt Readings from Last 3 Encounters:  03/03/20 274 lb (124.3 kg)  12/20/16 266 lb (120.7 kg)  11/15/16 281 lb (127.5 kg)    Physical Exam Vitals and nursing note reviewed.  Constitutional:      General: He is not in acute distress.    Appearance: He is well-developed. He is obese. He is not diaphoretic.     Comments: Well-appearing, comfortable, cooperative  HENT:     Head: Normocephalic and atraumatic.  Eyes:     General:        Right eye: No discharge.        Left eye: No discharge.     Conjunctiva/sclera: Conjunctivae normal.  Neck:     Thyroid: No thyromegaly.  Cardiovascular:     Rate and Rhythm: Normal rate and regular rhythm.     Heart sounds: Normal heart sounds. No murmur heard.   Pulmonary:     Effort: Pulmonary effort is normal. No respiratory distress.     Breath sounds: Normal breath sounds. No wheezing or rales.  Musculoskeletal:        General: Normal range of motion.     Cervical back: Normal range of motion and neck supple.  Lymphadenopathy:     Cervical: No cervical adenopathy.  Skin:    General: Skin is warm and dry.     Findings: No erythema or rash.  Neurological:     Mental Status: He is alert and oriented to person, place, and time.  Psychiatric:        Behavior: Behavior  normal.     Comments: Well groomed, good eye contact, normal speech and thoughts    No results found for this or any previous visit.    Assessment & Plan:   Problem List Items Addressed This Visit    Obesity (BMI 30.0-34.9)   Mixed hyperlipidemia    Controlled cholesterol on statin and lifestyle Last lipid panel approx 07/2019 need records  Plan: 1. Continue current meds -  Atorvastatin 20mg  daily 2. Encourage improved lifestyle - low carb/cholesterol, reduce portion size, continue improving regular exercise  Future reconsider Aspirin for 2ndary prevention      Relevant Medications   hydrALAZINE (APRESOLINE) 25 MG tablet   carvedilol (COREG) 25 MG tablet   atorvastatin (LIPITOR) 20 MG tablet   amLODipine (NORVASC) 5 MG tablet   lisinopril (ZESTRIL) 20 MG tablet   History of cerebrovascular accident (CVA) with residual deficit    History of CVA 2017 Records reviewed He is on Statin therapy Not on aspirin or plavix, will review prior records Has some sensation deficit L side chronic problem, left lower extremity foot affects his gait and balance at times but still very functional      Relevant Medications   gabapentin (NEURONTIN) 100 MG capsule   Essential hypertension - Primary    Elevated BP today off medication >4 days ran out needs refills - Home BP readings  Complication history CVA   Plan:  1. Continue current BP regimen  Amlodipine 5mg  daily, Carvedilol 25mg  BID, Hydralazine 75mg  (x 3 of 25mg  tab) TID, Lisinopril 20mg  daily  - refilled 2. Encourage improved lifestyle - low sodium diet, regular exercise 3. Continue monitor BP outside office, bring readings to next visit, if persistently >140/90 or new symptoms notify office sooner      Relevant Medications   hydrALAZINE (APRESOLINE) 25 MG tablet   carvedilol (COREG) 25 MG tablet   atorvastatin (LIPITOR) 20 MG tablet   amLODipine (NORVASC) 5 MG tablet   lisinopril (ZESTRIL) 20 MG tablet    Other Visit Diagnoses     Encounter to establish care with new doctor       Needs flu shot       Relevant Orders   Flu Vaccine QUAD 36+ mos IM (Completed)      #Request outside PCP records from Dr 2018 for review past 5 years    Meds ordered this encounter  Medications  . hydrALAZINE (APRESOLINE) 25 MG tablet    Sig: Take 3 tablets (75 mg total) by mouth 3 (three) times daily.    Dispense:  270 tablet    Refill:  5  . carvedilol (COREG) 25 MG tablet    Sig: Take 1 tablet (25 mg total) by mouth 2 (two) times daily with a meal.    Dispense:  180 tablet    Refill:  1  . atorvastatin (LIPITOR) 20 MG tablet    Sig: Take 1 tablet (20 mg total) by mouth at bedtime.    Dispense:  90 tablet    Refill:  1  . amLODipine (NORVASC) 5 MG tablet    Sig: Take 1 tablet (5 mg total) by mouth daily.    Dispense:  90 tablet    Refill:  1  . lisinopril (ZESTRIL) 20 MG tablet    Sig: Take 1 tablet (20 mg total) by mouth daily.    Dispense:  90 tablet    Refill:  1  . gabapentin (NEURONTIN) 100 MG capsule    Sig: Start 1 capsule daily, increase by 1 cap every 2-3 days as tolerated up to 3 times a day, or may take 3 at once in evening.    Dispense:  90 capsule    Refill:  1      Follow up plan: Return in about 6 weeks (around 04/14/2020) for 6 week fasting lab only then 1 week later Follow-up Lab review, HTN, HLD.  , DO [facility]  [facility] 03/03/2020, 10:36 AM

## 2020-03-03 NOTE — Patient Instructions (Addendum)
Thank you for coming to the office today.  Start Gabapentin 100mg  capsules, take at night for 2-3 nights only, and then increase to 2 times a day for a few days, and then may increase to 3 times a day, it may make you drowsy, if helps significantly at night only, then you can increase instead to 3 capsules at night, instead of 3 times a day - In the future if needed, we can significantly increase the dose if tolerated well, some common doses are 300mg  three times a day up to 600mg  three times a day, usually it takes several weeks or months to get to higher doses  Restart BP medications.  DUE for FASTING BLOOD WORK (no food or drink after midnight before the lab appointment, only water or coffee without cream/sugar on the morning of)  SCHEDULE "Lab Only" visit in the morning at the clinic for lab draw in 6 WEEKS   - Make sure Lab Only appointment is at about 1 week before your next appointment, so that results will be available  For Lab Results, once available within 2-3 days of blood draw, you can can log in to MyChart online to view your results and a brief explanation. Also, we can discuss results at next follow-up visit.    Please schedule a Follow-up Appointment to: Return in about 6 weeks (around 04/14/2020) for 6 week fasting lab only then 1 week later Follow-up Lab review, HTN, HLD.  If you have any other questions or concerns, please feel free to call the office or send a message through MyChart. You may also schedule an earlier appointment if necessary.  Additionally, you may be receiving a survey about your experience at our office within a few days to 1 week by e-mail or mail. We value your feedback.  , DO [facility], 04/16/2020

## 2020-03-03 NOTE — Assessment & Plan Note (Addendum)
History of CVA 2017 Records reviewed He is on Statin therapy Not on aspirin or plavix, will review prior records Has some sensation deficit L side chronic problem, left lower extremity foot affects his gait and balance at times but still very functional

## 2020-03-04 ENCOUNTER — Encounter: Payer: Self-pay | Admitting: Family Medicine

## 2020-04-06 ENCOUNTER — Telehealth: Payer: Self-pay | Admitting: Family Medicine

## 2020-04-06 DIAGNOSIS — I10 Essential (primary) hypertension: Secondary | ICD-10-CM

## 2020-04-06 DIAGNOSIS — E782 Mixed hyperlipidemia: Secondary | ICD-10-CM

## 2020-04-06 DIAGNOSIS — E669 Obesity, unspecified: Secondary | ICD-10-CM

## 2020-04-06 NOTE — Telephone Encounter (Signed)
Signed orders.

## 2020-04-07 ENCOUNTER — Other Ambulatory Visit: Payer: 59

## 2020-04-07 ENCOUNTER — Other Ambulatory Visit: Payer: Self-pay

## 2020-04-07 LAB — CBC
Hemoglobin: 12.7 g/dL — ABNORMAL LOW (ref 13.2–17.1)
RDW: 14 % (ref 11.0–15.0)

## 2020-04-08 LAB — LIPID PANEL
Cholesterol: 133 mg/dL (ref <200)
HDL: 41 mg/dL (ref >=40)
LDL Cholesterol (Calc): 73 mg/dL (calc)
Non-HDL Cholesterol (Calc): 92 mg/dL (calc) (ref <130)
Total CHOL/HDL Ratio: 3.2 (calc) (ref <5.0)
Triglycerides: 108 mg/dL (ref <150)

## 2020-04-08 LAB — CBC
HCT: 40.2 % (ref 38.5–50.0)
MCH: 27.1 pg (ref 27.0–33.0)
MCHC: 31.6 g/dL — ABNORMAL LOW (ref 32.0–36.0)
MCV: 85.9 fL (ref 80.0–100.0)
MPV: 11.3 fL (ref 7.5–12.5)
Platelets: 285 10*3/uL (ref 140–400)
RBC: 4.68 10*6/uL (ref 4.20–5.80)
WBC: 5.1 10*3/uL (ref 3.8–10.8)

## 2020-04-08 LAB — COMPLETE METABOLIC PANEL WITH GFR
AG Ratio: 1.1 (calc) (ref 1.0–2.5)
ALT: 15 U/L (ref 9–46)
AST: 21 U/L (ref 10–35)
Albumin: 4 g/dL (ref 3.6–5.1)
Alkaline phosphatase (APISO): 66 U/L (ref 35–144)
BUN/Creatinine Ratio: 8 (calc) (ref 6–22)
BUN: 12 mg/dL (ref 7–25)
CO2: 26 mmol/L (ref 20–32)
Calcium: 9.1 mg/dL (ref 8.6–10.3)
Chloride: 105 mmol/L (ref 98–110)
Creat: 1.52 mg/dL — ABNORMAL HIGH (ref 0.70–1.33)
GFR, Est African American: 59 mL/min/{1.73_m2} — ABNORMAL LOW (ref >=60)
GFR, Est Non African American: 51 mL/min/{1.73_m2} — ABNORMAL LOW (ref >=60)
Globulin: 3.5 g/dL (calc) (ref 1.9–3.7)
Glucose, Bld: 94 mg/dL (ref 65–99)
Potassium: 3.5 mmol/L (ref 3.5–5.3)
Sodium: 140 mmol/L (ref 135–146)
Total Bilirubin: 1 mg/dL (ref 0.2–1.2)
Total Protein: 7.5 g/dL (ref 6.1–8.1)

## 2020-04-08 LAB — HEMOGLOBIN A1C
Hgb A1c MFr Bld: 5.2 % of total Hgb (ref <5.7)
Mean Plasma Glucose: 103 (calc)
eAG (mmol/L): 5.7 (calc)

## 2020-04-14 ENCOUNTER — Encounter: Payer: Self-pay | Admitting: Family Medicine

## 2020-04-14 ENCOUNTER — Ambulatory Visit (INDEPENDENT_AMBULATORY_CARE_PROVIDER_SITE_OTHER): Payer: 59 | Admitting: Family Medicine

## 2020-04-14 ENCOUNTER — Other Ambulatory Visit: Payer: Self-pay

## 2020-04-14 VITALS — BP 134/81 | HR 61 | Temp 97.1°F | Resp 16 | Ht 75.0 in | Wt 283.6 lb

## 2020-04-14 DIAGNOSIS — G6289 Other specified polyneuropathies: Secondary | ICD-10-CM

## 2020-04-14 DIAGNOSIS — N183 Chronic kidney disease, stage 3 unspecified: Secondary | ICD-10-CM

## 2020-04-14 DIAGNOSIS — D649 Anemia, unspecified: Secondary | ICD-10-CM | POA: Diagnosis not present

## 2020-04-14 DIAGNOSIS — I129 Hypertensive chronic kidney disease with stage 1 through stage 4 chronic kidney disease, or unspecified chronic kidney disease: Secondary | ICD-10-CM | POA: Diagnosis not present

## 2020-04-14 DIAGNOSIS — I6930 Unspecified sequelae of cerebral infarction: Secondary | ICD-10-CM

## 2020-04-14 DIAGNOSIS — G629 Polyneuropathy, unspecified: Secondary | ICD-10-CM | POA: Insufficient documentation

## 2020-04-14 DIAGNOSIS — E782 Mixed hyperlipidemia: Secondary | ICD-10-CM

## 2020-04-14 MED ORDER — GABAPENTIN 300 MG PO CAPS: 300.0000 mg | ORAL_CAPSULE | Freq: Every day | ORAL | 3 refills | Status: DC

## 2020-04-14 NOTE — Assessment & Plan Note (Signed)
Controlled cholesterol on statin and lifestyle Last lipid 03/2020 controlled  Plan: 1. Continue current meds - Atorvastatin 20mg  daily 2. Encourage improved lifestyle - low carb/cholesterol, reduce portion size, continue improving regular exercise  Start ASA 81mg  daily

## 2020-04-14 NOTE — Assessment & Plan Note (Signed)
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

## 2020-04-14 NOTE — Assessment & Plan Note (Signed)
Mild lower Hgb 12.7 Otherwise normal markers No sign of bleeding Up to date colonoscopy Reviewed diet and will follow-up

## 2020-04-14 NOTE — Assessment & Plan Note (Signed)
History of CVA 2017 Records reviewed He is on Statin therapy Has some sensation deficit L side chronic problem, left lower extremity foot affects his gait and balance at times but still very functional - discussed today, seems improved on Gabapentin 300mg  nightly less numbness. He is improving activity and muscle strength  He will start Aspirin 81mg  once daily.

## 2020-04-14 NOTE — Progress Notes (Signed)
Subjective:    Patient ID: Dalton Mathis, male    DOB: 12-10-1965, 54 y.o.   MRN: [id number]  Dalton Mathis is a 54 y.o. male presenting on 04/14/2020 for Hypertension   HPI   History of CVA  History of CVA in 2017,  Reduced sensation of Left inner foot, and also residual numbness often in Left upper extremity - In past he was on trial of Lyrica, without significant improvement - Now he is taking Gabapentin nightly 100mg  x 3 = 300mg  at night with good results, helps him have restful sleep, too sleepy to take during the day. He says the tingling intensity is not as severe now it has improved. - He completed PT regimen post-CVA. He was transitioned to Sports Rehab PT after completed initial PT  CHRONIC HTN / CKD-III Prior history of labs CKD-III range Cr 1.5 to 1.8 from prior PCP Last lab showed at Cr 1.52 - within baseline Current Meds - Amlodipine 5mg  daily, Carvedilol 25mg  BID, Hydralazine 75mg  (x 3 of 25mg  tab) TID, Lisinopril 20mg  daily  Reports good compliance, did take meds today. Tolerating well, w/o complaints. Admits some lower extremity edema left lower leg - history of chronic recurrent swelling, he always has some numbness in lower ext, over past few days swelling did improve. He continues to drink water. Was not wearing compression. Denies CP, dyspnea, HA, dizziness / lightheadedness  HYPERLIPIDEMIA: - Reports no concerns. Last lipid panel 03/2020, controlled LDL 73 - Currently taking Atorvastatin 20mg  nightly, tolerating well without side effects or myalgias  Anemia, normocytic Recent lab showed mild Hemoglobin 12.7 low, with otherwise normal rest of CBC markers.  Additional concern  Admits taste was off for about 1 week. He noticed it when tasting OJ. Unrelated to any illness or other cause, he was asking about covid vaccine or med side effect. Then it resolved within 1 week, now back to normal.  Podiatry  Health Maintenance:  UTD COVID Vaccine Last  dose 09/03/19 Pfizer. Eligible for booster dose whenever.  UTD Flu Vaccine.  Completed Colonoscopy 12/20/2016, first screening, Dr , result with 0 polyps, return in 10 years.   Depression screen [facility] 2/9 04/14/2020 03/03/2020  Decreased Interest 0 0  Down, Depressed, Hopeless 0 0  PHQ - 2 Score 0 0    Social History   Tobacco Use  . Smoking status: Never Smoker  . Smokeless tobacco: Never Used  Substance Use Topics  . Alcohol use: Yes    Alcohol/week: 2.0 standard drinks    Types: 2 Cans of beer per week  . Drug use: No    Review of Systems Per HPI unless specifically indicated above     Objective:    BP 134/81   Pulse 61   Temp (!) 97.1 F (36.2 C) (Temporal)   Resp 16   Ht 6\' 3"  (1.905 m)   Wt 283 lb 9.6 oz (128.6 kg)   SpO2 100%   BMI 35.45 kg/m   Wt Readings from Last 3 Encounters:  04/14/20 283 lb 9.6 oz (128.6 kg)  03/03/20 274 lb (124.3 kg)  12/20/16 266 lb (120.7 kg)    Physical Exam Vitals and nursing note reviewed.  Constitutional:      General: He is not in acute distress.    Appearance: He is well-developed. He is not diaphoretic.     Comments: Well-appearing, comfortable, cooperative  HENT:     Head: Normocephalic and atraumatic.  Eyes:     General:  Right eye: No discharge.        Left eye: No discharge.     Conjunctiva/sclera: Conjunctivae normal.  Neck:     Thyroid: No thyromegaly.  Cardiovascular:     Rate and Rhythm: Normal rate and regular rhythm.     Heart sounds: Normal heart sounds. No murmur heard.   Pulmonary:     Effort: Pulmonary effort is normal. No respiratory distress.     Breath sounds: Normal breath sounds. No wheezing or rales.  Musculoskeletal:        General: Normal range of motion.     Cervical back: Normal range of motion and neck supple.     Left lower leg: Edema (+1 trace mild pitting) present.  Lymphadenopathy:     Cervical: No cervical adenopathy.  Skin:    General: Skin is warm and dry.      Findings: No erythema or rash.  Neurological:     Mental Status: He is alert and oriented to person, place, and time.  Psychiatric:        Behavior: Behavior normal.     Comments: Well groomed, good eye contact, normal speech and thoughts       Results for orders placed or performed in visit on 04/06/20  COMPLETE METABOLIC PANEL WITH GFR  Result Value Ref Range   Glucose, Bld 94 65 - 99 mg/dL   BUN 12 7 - 25 mg/dL   Creat 0.01 (H) 7.49 - 1.33 mg/dL   GFR, Est Non African American 51 (L) > OR = 60 mL/min/1.14m2   GFR, Est African American 59 (L) > OR = 60 mL/min/1.15m2   BUN/Creatinine Ratio 8 6 - 22 (calc)   Sodium 140 135 - 146 mmol/L   Potassium 3.5 3.5 - 5.3 mmol/L   Chloride 105 98 - 110 mmol/L   CO2 26 20 - 32 mmol/L   Calcium 9.1 8.6 - 10.3 mg/dL   Total Protein 7.5 6.1 - 8.1 g/dL   Albumin 4.0 3.6 - 5.1 g/dL   Globulin 3.5 1.9 - 3.7 g/dL (calc)   AG Ratio 1.1 1.0 - 2.5 (calc)   Total Bilirubin 1.0 0.2 - 1.2 mg/dL   Alkaline phosphatase (APISO) 66 35 - 144 U/L   AST 21 10 - 35 U/L   ALT 15 9 - 46 U/L  CBC  Result Value Ref Range   WBC 5.1 3.8 - 10.8 Thousand/uL   RBC 4.68 4.20 - 5.80 Million/uL   Hemoglobin 12.7 (L) 13.2 - 17.1 g/dL   HCT 44.9 38 - 50 %   MCV 85.9 80.0 - 100.0 fL   MCH 27.1 27.0 - 33.0 pg   MCHC 31.6 (L) 32.0 - 36.0 g/dL   RDW 67.5 91.6 - 38.4 %   Platelets 285 140 - 400 Thousand/uL   MPV 11.3 7.5 - 12.5 fL  Lipid panel  Result Value Ref Range   Cholesterol 133 <200 mg/dL   HDL 41 > OR = 40 mg/dL   Triglycerides 665 <993 mg/dL   LDL Cholesterol (Calc) 73 mg/dL (calc)   Total CHOL/HDL Ratio 3.2 <5.0 (calc)   Non-HDL Cholesterol (Calc) 92 <570 mg/dL (calc)  Hemoglobin V7B  Result Value Ref Range   Hgb A1c MFr Bld 5.2 <5.7 % of total Hgb   Mean Plasma Glucose 103 (calc)   eAG (mmol/L) 5.7 (calc)      Assessment & Plan:   Problem List Items Addressed This Visit    Peripheral neuropathy    Secondary  complication from CVA Chronic  problem Improved on Gabapentin 300mg  nightly - switch now from 100mg  x 3 to a 300mg  nightly now      Relevant Medications   gabapentin (NEURONTIN) 300 MG capsule   Normocytic anemia    Mild lower Hgb 12.7 Otherwise normal markers No sign of bleeding Up to date colonoscopy Reviewed diet and will follow-up      Mixed hyperlipidemia    Controlled cholesterol on statin and lifestyle Last lipid 03/2020 controlled  Plan: 1. Continue current meds - Atorvastatin 20mg  daily 2. Encourage improved lifestyle - low carb/cholesterol, reduce portion size, continue improving regular exercise  Start ASA 81mg  daily      History of cerebrovascular accident (CVA) with residual deficit    History of CVA 2017 Records reviewed He is on Statin therapy Has some sensation deficit L side chronic problem, left lower extremity foot affects his gait and balance at times but still very functional - discussed today, seems improved on Gabapentin 300mg  nightly less numbness. He is improving activity and muscle strength  He will start Aspirin 81mg  once daily.      Relevant Medications   gabapentin (NEURONTIN) 300 MG capsule   Benign hypertension with CKD (chronic kidney disease) stage III (HCC) - Primary    Controlled HTN back on medicines - Home BP readings  Complication history CVA  Some LLE edema still episodic - will do RICE therapy, improve activity of lower leg    Plan:  1. Continue current BP regimen  Amlodipine 5mg  daily, Carvedilol 25mg  BID, Hydralazine 75mg  (x 3 of 25mg  tab) TID, Lisinopril 20mg  daily 2. Encourage improved lifestyle - low sodium diet, regular exercise 3. Continue monitor BP outside office, bring readings to next visit, if persistently >140/90 or new symptoms notify office sooner         Meds ordered this encounter  Medications  . gabapentin (NEURONTIN) 300 MG capsule    Sig: Take 1 capsule (300 mg total) by mouth at bedtime.    Dispense:  90 capsule    Refill:  3     Changed from 100mg  capsules to 300mg  once nightly      Follow up plan: Return in about 6 months (around 10/12/2020) for 6 month follow-up HTN CKD, Edema, lab BMET AFTER visit.   04/2020, DO [facility] 04/14/2020, 8:49 AM

## 2020-04-14 NOTE — Patient Instructions (Addendum)
Thank you for coming to the office today.  Changed Gabapentin from 100mg  to a 300mg  capsule - take one at bedtime, 90 pills with refills.  COVID booster whenever you are ready. 2nd last dose was 09/03/19 Pfizer.  For lower extremity swelling, would recommend improving muscle strength and activity, compression elevation.  Recent Labs    04/07/20 0849  HGBA1C 5.2    Lab in 6 months for kidneys  Please schedule a Follow-up Appointment to: Return in about 6 months (around 10/12/2020) for 6 month follow-up HTN CKD, Edema, lab BMET AFTER visit.  If you have any other questions or concerns, please feel free to call the office or send a message through MyChart. You may also schedule an earlier appointment if necessary.  Additionally, you may be receiving a survey about your experience at our office within a few days to 1 week by e-mail or mail. We value your feedback.  04/09/20, DO [facility], Saralyn Pilar

## 2020-04-14 NOTE — Assessment & Plan Note (Addendum)
Controlled HTN back on medicines - Home BP readings  Complication history CVA  Some LLE edema still episodic - will do RICE therapy, improve activity of lower leg    Plan:  1. Continue current BP regimen  Amlodipine 5mg  daily, Carvedilol 25mg  BID, Hydralazine 75mg  (x 3 of 25mg  tab) TID, Lisinopril 20mg  daily 2. Encourage improved lifestyle - low sodium diet, regular exercise 3. Continue monitor BP outside office, bring readings to next visit, if persistently >140/90 or new symptoms notify office sooner

## 2020-10-06 ENCOUNTER — Other Ambulatory Visit: Payer: Self-pay | Admitting: Family Medicine

## 2020-10-06 ENCOUNTER — Encounter: Payer: Self-pay | Admitting: Family Medicine

## 2020-10-06 ENCOUNTER — Ambulatory Visit (INDEPENDENT_AMBULATORY_CARE_PROVIDER_SITE_OTHER): Payer: 59 | Admitting: Family Medicine

## 2020-10-06 ENCOUNTER — Other Ambulatory Visit: Payer: Self-pay

## 2020-10-06 VITALS — BP 148/90 | HR 54 | Ht 75.0 in | Wt 281.0 lb

## 2020-10-06 DIAGNOSIS — E782 Mixed hyperlipidemia: Secondary | ICD-10-CM

## 2020-10-06 DIAGNOSIS — Z125 Encounter for screening for malignant neoplasm of prostate: Secondary | ICD-10-CM

## 2020-10-06 DIAGNOSIS — I129 Hypertensive chronic kidney disease with stage 1 through stage 4 chronic kidney disease, or unspecified chronic kidney disease: Secondary | ICD-10-CM | POA: Diagnosis not present

## 2020-10-06 DIAGNOSIS — R7309 Other abnormal glucose: Secondary | ICD-10-CM

## 2020-10-06 DIAGNOSIS — N183 Chronic kidney disease, stage 3 unspecified: Secondary | ICD-10-CM | POA: Diagnosis not present

## 2020-10-06 DIAGNOSIS — Z1159 Encounter for screening for other viral diseases: Secondary | ICD-10-CM

## 2020-10-06 DIAGNOSIS — Z0000 Encounter for general adult medical examination without abnormal findings: Secondary | ICD-10-CM

## 2020-10-06 DIAGNOSIS — D649 Anemia, unspecified: Secondary | ICD-10-CM

## 2020-10-06 DIAGNOSIS — Z114 Encounter for screening for human immunodeficiency virus [HIV]: Secondary | ICD-10-CM

## 2020-10-06 MED ORDER — HYDRALAZINE HCL 25 MG PO TABS: 75.0000 mg | ORAL_TABLET | Freq: Three times a day (TID) | ORAL | 5 refills | Status: DC

## 2020-10-06 MED ORDER — ATORVASTATIN CALCIUM 20 MG PO TABS: 20.0000 mg | ORAL_TABLET | Freq: Every day | ORAL | 1 refills | Status: DC

## 2020-10-06 MED ORDER — CARVEDILOL 25 MG PO TABS: 25.0000 mg | ORAL_TABLET | Freq: Two times a day (BID) | ORAL | 1 refills | Status: DC

## 2020-10-06 MED ORDER — AMLODIPINE BESYLATE 10 MG PO TABS: 10.0000 mg | ORAL_TABLET | Freq: Every day | ORAL | 3 refills | Status: DC

## 2020-10-06 MED ORDER — LISINOPRIL 20 MG PO TABS: 20.0000 mg | ORAL_TABLET | Freq: Every day | ORAL | 1 refills | Status: DC

## 2020-10-06 NOTE — Progress Notes (Signed)
Subjective:    Patient ID: Dalton Mathis, male    DOB: 02-03-1966, 55 y.o.   MRN: [id number]  Dalton Mathis is a 55 y.o. male presenting on 10/06/2020 for Hypertension and Chronic Kidney Disease   HPI   History of CVA  History of CVA in 2017,  Reduced sensation of Left inner foot, and also residual numbness often in Left upper extremity - In past he was on trial of Lyrica, without significant improvement Continues Gabapentin 300mg  nightly, seems to have some increased numbness in Left lower extremity. - He completed PT regimen post-CVA. He was transitioned to Sports Rehab PT after completed initial PT  CHRONIC HTN/ CKD-III Prior history of labs CKD-III range Cr 1.5 to 1.8 from prior PCP Last lab showed at Cr 1.52 - within baseline Due for lab today BMET Elevated BP from 130s to 140s+ Current Meds -Amlodipine 5mg  daily, Carvedilol 25mg  BID, Hydralazine75mg (x 3 of 25mg  tab)TID, Lisinopril 20mg  daily Reports good compliance,did takemeds today. Tolerating well, w/o complaints. Admits some lower extremity edema left lower leg - history of chronic recurrent swelling, he always has some numbness in lower ext, over past few days swelling did improve. He continues to drink water. Was not wearing compression. Denies CP, dyspnea, HA, dizziness / lightheadedness  Hyperlipidemia Continues on Atorvastatin 20mg  daily, needs refill   Depression screen [facility] 2/9 10/06/2020 04/14/2020 03/03/2020  Decreased Interest 0 0 0  Down, Depressed, Hopeless 0 0 0  PHQ - 2 Score 0 0 0  Altered sleeping 0 - -  Tired, decreased energy 0 - -  Change in appetite 0 - -  Feeling bad or failure about yourself  0 - -  Trouble concentrating 0 - -  Moving slowly or fidgety/restless 0 - -  Suicidal thoughts 0 - -  PHQ-9 Score 0 - -  Difficult doing work/chores Not difficult at all - -    Social History   Tobacco Use  . Smoking status: Never Smoker  . Smokeless tobacco: Never Used  Substance  Use Topics  . Alcohol use: Yes    Alcohol/week: 2.0 standard drinks    Types: 2 Cans of beer per week  . Drug use: No    Review of Systems Per HPI unless specifically indicated above     Objective:    BP (!) 148/90 (BP Location: Left Arm, Cuff Size: Normal)   Pulse (!) 54   Ht 6\' 3"  (1.905 m)   Wt 281 lb (127.5 kg)   SpO2 99%   BMI 35.12 kg/m   Wt Readings from Last 3 Encounters:  10/06/20 281 lb (127.5 kg)  04/14/20 283 lb 9.6 oz (128.6 kg)  03/03/20 274 lb (124.3 kg)    Physical Exam Vitals and nursing note reviewed.  Constitutional:      General: He is not in acute distress.    Appearance: He is well-developed. He is obese. He is not diaphoretic.     Comments: Well-appearing, comfortable, cooperative  HENT:     Head: Normocephalic and atraumatic.  Eyes:     General:        Right eye: No discharge.        Left eye: No discharge.     Conjunctiva/sclera: Conjunctivae normal.  Cardiovascular:     Rate and Rhythm: Normal rate.  Pulmonary:     Effort: Pulmonary effort is normal.  Musculoskeletal:     Right lower leg: No edema.     Left lower leg: No edema.  Skin:  General: Skin is warm and dry.     Findings: No erythema or rash.  Neurological:     Mental Status: He is alert and oriented to person, place, and time.  Psychiatric:        Behavior: Behavior normal.     Comments: Well groomed, good eye contact, normal speech and thoughts       Results for orders placed or performed in visit on 04/06/20  COMPLETE METABOLIC PANEL WITH GFR  Result Value Ref Range   Glucose, Bld 94 65 - 99 mg/dL   BUN 12 7 - 25 mg/dL   Creat 9.56 (H) 3.87 - 1.33 mg/dL   GFR, Est Non African American 51 (L) > OR = 60 mL/min/1.5m2   GFR, Est African American 59 (L) > OR = 60 mL/min/1.18m2   BUN/Creatinine Ratio 8 6 - 22 (calc)   Sodium 140 135 - 146 mmol/L   Potassium 3.5 3.5 - 5.3 mmol/L   Chloride 105 98 - 110 mmol/L   CO2 26 20 - 32 mmol/L   Calcium 9.1 8.6 - 10.3 mg/dL    Total Protein 7.5 6.1 - 8.1 g/dL   Albumin 4.0 3.6 - 5.1 g/dL   Globulin 3.5 1.9 - 3.7 g/dL (calc)   AG Ratio 1.1 1.0 - 2.5 (calc)   Total Bilirubin 1.0 0.2 - 1.2 mg/dL   Alkaline phosphatase (APISO) 66 35 - 144 U/L   AST 21 10 - 35 U/L   ALT 15 9 - 46 U/L  CBC  Result Value Ref Range   WBC 5.1 3.8 - 10.8 Thousand/uL   RBC 4.68 4.20 - 5.80 Million/uL   Hemoglobin 12.7 (L) 13.2 - 17.1 g/dL   HCT 56.4 33.2 - 95.1 %   MCV 85.9 80.0 - 100.0 fL   MCH 27.1 27.0 - 33.0 pg   MCHC 31.6 (L) 32.0 - 36.0 g/dL   RDW 88.4 16.6 - 06.3 %   Platelets 285 140 - 400 Thousand/uL   MPV 11.3 7.5 - 12.5 fL  Lipid panel  Result Value Ref Range   Cholesterol 133 <200 mg/dL   HDL 41 > OR = 40 mg/dL   Triglycerides 016 <010 mg/dL   LDL Cholesterol (Calc) 73 mg/dL (calc)   Total CHOL/HDL Ratio 3.2 <5.0 (calc)   Non-HDL Cholesterol (Calc) 92 <932 mg/dL (calc)  Hemoglobin T5T  Result Value Ref Range   Hgb A1c MFr Bld 5.2 <5.7 % of total Hgb   Mean Plasma Glucose 103 (calc)   eAG (mmol/L) 5.7 (calc)      Assessment & Plan:   Problem List Items Addressed This Visit    Mixed hyperlipidemia    Controlled cholesterol on statin and lifestyle Last lipid 03/2020 controlled  Plan: 1. Continue current meds - Atorvastatin 20mg  daily 2. Encourage improved lifestyle - low carb/cholesterol, reduce portion size, continue improving regular exercise Refill atorvastatin      Relevant Medications   amLODipine (NORVASC) 10 MG tablet   atorvastatin (LIPITOR) 20 MG tablet   carvedilol (COREG) 25 MG tablet   lisinopril (ZESTRIL) 20 MG tablet   hydrALAZINE (APRESOLINE) 25 MG tablet   Benign hypertension with CKD (chronic kidney disease) stage III (HCC) - Primary    Elevated BP readings - Home BP readings  Complication history CVA LLE Edema episodic    Plan:  1. Increase BP medication Amlodipine from 5 to 10mg  daily. 2. Continue current BP regimen Carvedilol 25mg  BID, Hydralazine 75mg  (x 3 of 25mg  tab)  TID,  Lisinopril 20mg  daily 3. Encourage improved lifestyle - low sodium diet, regular exercise 4. Continue monitor BP outside office, bring readings to next visit, if persistently >140/90 or new symptoms notify office sooner      Relevant Medications   amLODipine (NORVASC) 10 MG tablet   atorvastatin (LIPITOR) 20 MG tablet   carvedilol (COREG) 25 MG tablet   lisinopril (ZESTRIL) 20 MG tablet   hydrALAZINE (APRESOLINE) 25 MG tablet   Other Relevant Orders   BASIC METABOLIC PANEL WITH GFR      Meds ordered this encounter  Medications  . amLODipine (NORVASC) 10 MG tablet    Sig: Take 1 tablet (10 mg total) by mouth daily.    Dispense:  90 tablet    Refill:  3    Increase dose from 5 to 10  . atorvastatin (LIPITOR) 20 MG tablet    Sig: Take 1 tablet (20 mg total) by mouth at bedtime.    Dispense:  90 tablet    Refill:  1  . carvedilol (COREG) 25 MG tablet    Sig: Take 1 tablet (25 mg total) by mouth 2 (two) times daily with a meal.    Dispense:  180 tablet    Refill:  1  . lisinopril (ZESTRIL) 20 MG tablet    Sig: Take 1 tablet (20 mg total) by mouth daily.    Dispense:  90 tablet    Refill:  1  . hydrALAZINE (APRESOLINE) 25 MG tablet    Sig: Take 3 tablets (75 mg total) by mouth 3 (three) times daily.    Dispense:  270 tablet    Refill:  5     Follow up plan: Return in about 6 months (around 04/08/2021) for 6 month fasting lab only then 1 week later Annual Physical.  Future labs ordered for 04/01/21 add TSH PSA HIV, Hep C   13/10/22, DO [facility] 10/06/2020, 8:10 AM

## 2020-10-06 NOTE — Assessment & Plan Note (Signed)
Controlled cholesterol on statin and lifestyle Last lipid 03/2020 controlled  Plan: 1. Continue current meds - Atorvastatin 20mg  daily 2. Encourage improved lifestyle - low carb/cholesterol, reduce portion size, continue improving regular exercise Refill atorvastatin

## 2020-10-06 NOTE — Assessment & Plan Note (Addendum)
Elevated BP readings - Home BP readings  Complication history CVA LLE Edema episodic    Plan:  1. Increase BP medication Amlodipine from 5 to 10mg  daily. 2. Continue current BP regimen Carvedilol 25mg  BID, Hydralazine 75mg  (x 3 of 25mg  tab) TID, Lisinopril 20mg  daily 3. Encourage improved lifestyle - low sodium diet, regular exercise 4. Continue monitor BP outside office, bring readings to next visit, if persistently >140/90 or new symptoms notify office sooner

## 2020-10-06 NOTE — Patient Instructions (Addendum)
Thank you for coming to the office today.  Increase Amlodipine from 5 to 10mg  daily, new rx sent  Refilled other meds.  Keep track of BP, goal < 140 / 90  For nerve can refer to Neurologist for nerve conduction testing if you are interested.   DUE for FASTING BLOOD WORK (no food or drink after midnight before the lab appointment, only water or coffee without cream/sugar on the morning of)  SCHEDULE "Lab Only" visit in the morning at the clinic for lab draw in 6 MONTHS   - Make sure Lab Only appointment is at about 1 week before your next appointment, so that results will be available  For Lab Results, once available within 2-3 days of blood draw, you can can log in to MyChart online to view your results and a brief explanation. Also, we can discuss results at next follow-up visit.   Please schedule a Follow-up Appointment to: Return in about 6 months (around 04/08/2021) for 6 month fasting lab only then 1 week later Annual Physical.  If you have any other questions or concerns, please feel free to call the office or send a message through MyChart. You may also schedule an earlier appointment if necessary.  Additionally, you may be receiving a survey about your experience at our office within a few days to 1 week by e-mail or mail. We value your feedback.  04/10/2021, DO [facility], [facility]

## 2020-10-07 LAB — BASIC METABOLIC PANEL WITH GFR
BUN/Creatinine Ratio: 12 (calc) (ref 6–22)
BUN: 17 mg/dL (ref 7–25)
CO2: 29 mmol/L (ref 20–32)
Calcium: 9.2 mg/dL (ref 8.6–10.3)
Chloride: 104 mmol/L (ref 98–110)
Creat: 1.47 mg/dL — ABNORMAL HIGH (ref 0.70–1.33)
GFR, Est African American: 62 mL/min/{1.73_m2} (ref >=60)
GFR, Est Non African American: 53 mL/min/{1.73_m2} — ABNORMAL LOW (ref >=60)
Glucose, Bld: 101 mg/dL — ABNORMAL HIGH (ref 65–99)
Potassium: 3.9 mmol/L (ref 3.5–5.3)
Sodium: 140 mmol/L (ref 135–146)

## 2020-10-14 ENCOUNTER — Ambulatory Visit: Payer: 59 | Admitting: Family Medicine

## 2021-01-14 ENCOUNTER — Ambulatory Visit
Admission: RE | Admit: 2021-01-14 | Discharge: 2021-01-14 | Disposition: A | Discharge status: Home / Self Care | Payer: 59 | Source: Ambulatory Visit

## 2021-01-14 ENCOUNTER — Other Ambulatory Visit: Payer: Self-pay

## 2021-01-14 VITALS — BP 135/84 | HR 65 | Temp 97.7°F | Resp 16

## 2021-01-14 DIAGNOSIS — M5432 Sciatica, left side: Secondary | ICD-10-CM | POA: Diagnosis not present

## 2021-01-14 MED ORDER — PREDNISONE 10 MG PO TABS: ORAL_TABLET | ORAL | 0 refills | Status: AC

## 2021-01-14 NOTE — ED Triage Notes (Signed)
Patient presents to Urgent Care with complaints of left sided lower back pain that radiates to left lower leg. Pain increased with standing. Treating pain with Excedrin.

## 2021-01-14 NOTE — ED Provider Notes (Addendum)
Chief Complaint   Chief Complaint  Patient presents with   Appointment    1200   Back Pain   Leg Pain     Subjective, HPI  Dalton Mathis is a 55 y.o. male who presents with left-sided lower back discomfort which radiates to the left lower leg for about the last week and a half.  Patient reports that his pain is worse with standing and as he walks throughout the day.  Patient states that when he lays down to sleep at night it does not bother him quite as much.  Patient states that certain times when he sits in chairs he will notice some increased discomfort particularly with wooden hard surfaces. No bowel or bladder incontinence, urinary retention, or groin paresthesias. No fever or chills.  No trauma or falls precipitating current symptoms.  Patient states he has been treating the discomfort with Excedrin which has helped somewhat, but does not seem to have relieved his symptoms completely.  History obtained from patient.   Patient's problem list, past medical and social history, medications, and allergies were reviewed by me and updated in Epic.    ROS  See HPI.  Objective   Vitals:   01/14/21 1218  BP: 135/84  Pulse: 65  Resp: 16  Temp: 97.7 F (36.5 C)  SpO2: 96%     General: Appears well-developed and well-nourished. No acute distress.  Head: Normocephalic and atraumatic.   Neck: Normal range of motion, neck is supple.  Cardiovascular: Normal rate.  Pulm/Chest: No respiratory distress.  Musculoskeletal: Lumbar Spine: Mild TTP noted to left buttocks region. No midline tenderness or step-off. BLE with 5/5 strength, full sensation. Gait is normal. Neurological: Alert and oriented to person, place, and time.  Skin: Skin is warm and dry.   Psychiatric: Normal mood, affect, behavior, and thought content.   Vital signs and nursing note reviewed.   Data  Imaging None needed. No trauma or falls precipitating current symptoms.  Assessment & Plan  1. Left sided  sciatica - predniSONE (DELTASONE) 10 MG tablet; Take 6 tablets (60 mg total) by mouth daily for 1 day, THEN 5 tablets (50 mg total) daily for 1 day, THEN 4 tablets (40 mg total) daily for 1 day, THEN 3 tablets (30 mg total) daily for 1 day, THEN 2 tablets (20 mg total) daily for 1 day, THEN 1 tablet (10 mg total) daily for 1 day.  Dispense: 21 tablet; Refill: 0  55 y.o. male presents with left-sided lower back discomfort which radiates to the left lower leg for about the last week and a half.  Patient reports that his pain is worse with standing and as he walks throughout the day.  Patient states that when he lays down to sleep at night it does not bother him quite as much.  Patient states that certain times when he sits in chairs he will notice some increased discomfort particularly with wooden hard surfaces. No bowel or bladder incontinence, urinary retention, or groin paresthesias. No fever or chills.  No trauma or falls precipitating current symptoms.  Patient states he has been treating the discomfort with Excedrin which has helped somewhat, but does not seem to have relieved his symptoms completely.  Chart review completed.  Given symptoms along with assessment findings, likely left-sided sciatica.  Rx'd a prednisone taper to the patient's preferred pharmacy as he does advise that he has used this medication without difficulty in the past.  Advised of at home treatment and care to include  exercises, ice, rest.  Advised to follow-up with PCP within the next 1 week for recheck if symptoms do not improve with steroid taper.  Patient verbalized understanding and agreed with plan.  Patient stable upon discharge.  Return as needed.  Plan:   Discharge Instructions      Follow-up with PCP within 1 week for recheck   Take prednisone as prescribed.  Stretch as tolerated throughout the day, ice the left buttocks region throughout the day, rest when you begin to feel discomfort.  Return to clinic for any  worsening of symptoms to include bowel or bladder incontinence, paresthesias to the groin, fever, chills, left lower extremity weakness.         Amalia Greenhouse, FNP 01/14/21 1237    Amalia Greenhouse, FNP 01/14/21 1238    Amalia Greenhouse, FNP 01/14/21 1239

## 2021-01-14 NOTE — Discharge Instructions (Addendum)
Follow-up with PCP within 1 week for recheck   Take prednisone as prescribed.  Stretch as tolerated throughout the day, ice the left buttocks region throughout the day, rest when you begin to feel discomfort.  Return to clinic for any worsening of symptoms to include bowel or bladder incontinence, paresthesias to the groin, fever, chills, left lower extremity weakness.

## 2021-01-26 ENCOUNTER — Telehealth (INDEPENDENT_AMBULATORY_CARE_PROVIDER_SITE_OTHER): Payer: 59 | Admitting: Family Medicine

## 2021-01-26 ENCOUNTER — Encounter: Payer: Self-pay | Admitting: Family Medicine

## 2021-01-26 VITALS — Ht 75.0 in | Wt 285.0 lb

## 2021-01-26 DIAGNOSIS — M5442 Lumbago with sciatica, left side: Secondary | ICD-10-CM

## 2021-01-26 MED ORDER — CYCLOBENZAPRINE HCL 10 MG PO TABS: 10.0000 mg | ORAL_TABLET | Freq: Two times a day (BID) | ORAL | 1 refills | Status: DC | PRN

## 2021-01-26 NOTE — Progress Notes (Signed)
Subjective:    Patient ID: Dalton Mathis, male    DOB: 1965-08-19, 55 y.o.   MRN: [id number]  Dalton Mathis is a 55 y.o. male presenting on 01/26/2021 for Sciatica  Virtual / Telehealth Encounter - Video Visit via MyChart The purpose of this virtual visit is to provide medical care while limiting exposure to the novel coronavirus (COVID19) for both patient and office staff.  Consent was obtained for remote visit:  Yes.   Answered questions that patient had about telehealth interaction:  Yes.   I discussed the limitations, risks, security and privacy concerns of performing an evaluation and management service by video/telephone. I also discussed with the patient that there may be a patient responsible charge related to this service. The patient expressed understanding and agreed to proceed.  Patient Location: Home Provider Location: [facility] (Office)  Participants in virtual visit: - Patient: Dalton Mathis - CMA: Burnell Blanks, CMA - Provider: Dr Althea Charon   HPI  LOW BACK PAIN, Acute Left sided with SCiatica - Reports symptoms started about 3-4 weeks ago without obvious inciting injury, back pain flare up reported  Interval update - he went to [facility] on 01/14/21, he was given prednisone taper for 6 day taper with mixed results, temporary relief, but still had symptoms  Today seems to be worsening or persistent. Describes pain as low back pain as a steady discomfort moderate to severe at times with radiation pain into Left lower extremity. Fixed position and laying would trigger it as well, and some impact on his sleep. If sitting has had some improved. Admits some radiating symptoms into Left leg worse with walking and some tingling pain all the way down into leg. - Taking Excedrin and Goody powder PRN  He has some chronic issues with left lower extremity due to history of CVA. Difficulty interpreting some of the sensation in  Left lower extremity due to this problem.  - History of lumbar OA/DJD, x-rays back in 2008 reviewed. Mild degenerative arthritis.  He has had prior back pain flare up in past with Prednisone. Has not had recent muscle relaxant.  Gabapentin 300mg  nightly   No prior back surgery  - Admits difficulty sleeping due to pain - Denies any fevers/chills, numbness, tingling, weakness, loss of control bladder/bowel incontinence or retention, unintentional wt loss, night sweats    Depression screen [facility] 2/9 10/06/2020 04/14/2020 03/03/2020  Decreased Interest 0 0 0  Down, Depressed, Hopeless 0 0 0  PHQ - 2 Score 0 0 0  Altered sleeping 0 - -  Tired, decreased energy 0 - -  Change in appetite 0 - -  Feeling bad or failure about yourself  0 - -  Trouble concentrating 0 - -  Moving slowly or fidgety/restless 0 - -  Suicidal thoughts 0 - -  PHQ-9 Score 0 - -  Difficult doing work/chores Not difficult at all - -    Social History   Tobacco Use   Smoking status: Never   Smokeless tobacco: Never  Substance Use Topics   Alcohol use: Yes    Alcohol/week: 2.0 standard drinks    Types: 2 Cans of beer per week   Drug use: No    Review of Systems Per HPI unless specifically indicated above     Objective:    Ht 6\' 3"  (1.905 m)   Wt 285 lb (129.3 kg)   BMI 35.62 kg/m   Wt Readings from Last 3 Encounters:  01/26/21 285  lb (129.3 kg)  10/06/20 281 lb (127.5 kg)  04/14/20 283 lb 9.6 oz (128.6 kg)    Physical Exam  Note examination was completely remotely via video observation objective data only  Gen - well-appearing, no acute distress or apparent pain, comfortable HEENT - eyes appear clear without discharge or redness Heart/Lungs - cannot examine virtually - observed no evidence of coughing or labored breathing. Abd - cannot examine virtually  Skin - face visible today- no rash Neuro - awake, alert, oriented Psych - not anxious appearing   Results for orders placed or performed  in visit on 10/06/20  BASIC METABOLIC PANEL WITH GFR  Result Value Ref Range   Glucose, Bld 101 (H) 65 - 99 mg/dL   BUN 17 7 - 25 mg/dL   Creat 6.31 (H) 4.97 - 1.33 mg/dL   GFR, Est Non African American 53 (L) > OR = 60 mL/min/1.46m2   GFR, Est African American 62 > OR = 60 mL/min/1.71m2   BUN/Creatinine Ratio 12 6 - 22 (calc)   Sodium 140 135 - 146 mmol/L   Potassium 3.9 3.5 - 5.3 mmol/L   Chloride 104 98 - 110 mmol/L   CO2 29 20 - 32 mmol/L   Calcium 9.2 8.6 - 10.3 mg/dL      Assessment & Plan:   Problem List Items Addressed This Visit   None Visit Diagnoses     Acute left-sided low back pain with left-sided sciatica    -  Primary   Relevant Medications   cyclobenzaprine (FLEXERIL) 10 MG tablet   Other Relevant Orders   DG Lumbar Spine Complete       Meds ordered this encounter  Medications   cyclobenzaprine (FLEXERIL) 10 MG tablet    Sig: Take 1 tablet (10 mg total) by mouth 2 (two) times daily as needed for muscle spasms.    Dispense:  60 tablet    Refill:  1   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 Suspect likely due to muscle spasm/strain without known injury or trauma.  In setting of known chronic mild DJD OA on prior imaging 2008 but no chronic back pain has had flares before No prior back surgery - Inadequate conservative therapy   Plan: 1. Limited w/ NSAID due to CKD, avoiding oral NSAID 2. Reviewed UC visit 2 week ago, already took 6 day Prednisone taper 3. Start muscle relaxant with Flexeril 10mg  tabs - take 5-10mg  up to TID PRN, titrate up as tolerated caution sedation 4. Inc Tylenol ext str dose 500-1000mg  TID PRN regular dosing for now 5. Encouraged use of heating pad 1-2x daily for now then PRN  Follow-up 2-4  weeks if not improved for re-evaluation, consider X-ray imaging, trial of PT, and possibly referral to Orthopedic  Ordered X_ray lumbar spine already, can return to office anytime for this.  Orders Placed This Encounter   Procedures   DG Lumbar Spine Complete    Standing Status:   Future    Standing Expiration Date:   01/26/2022    Order Specific Question:   Reason for Exam (SYMPTOM  OR DIAGNOSIS REQUIRED)    Answer:   acute left low back pain sciatica history of arthritis lumbar DJD, no trauma or injury    Order Specific Question:   Preferred imaging location?    Answer:   [facility] 03/28/2022      Follow up plan: Return in about 2 weeks (around 02/09/2021), or if symptoms worsen or fail to improve, for  back pain.   Patient verbalizes understanding with the above medical recommendations including the limitation of remote medical advice.  Specific follow-up and call-back criteria were given for patient to follow-up or seek medical care more urgently if needed.  Total duration of direct patient care provided via video conference: 15  minutes   Saralyn Pilar, DO [facility] 01/26/2021, 11:43 AM

## 2021-01-26 NOTE — Patient Instructions (Addendum)
Thank you for coming to the office today.  Recommend to start taking Tylenol Extra Strength 500mg  tabs - take 1 to 2 tabs per dose (max 1000mg ) every 6-8 hours for pain (take regularly, don't skip a dose for next 7 days), max 24 hour daily dose is 6 tablets or 3000mg . In the future you can repeat the same everyday Tylenol course for 1-2 weeks at a time.   Start Cyclobenzapine (Flexeril) 10mg  tablets (muscle relaxant) - start with half (cut) to one whole pill at night for muscle relaxant - may make you sedated or sleepy (be careful driving or working on this) if tolerated you can take half to whole tab 2 to 3 times daily or every 8 hours as needed  Take Gabapentin 300mg  nightly and may add 1 extra during the day if need for pain  X-ray lumbar spine ordered here at our office, walk in, first come first serve Mon-Thurs, not Friday. 8am to 1130am, and 130pm to 4pm hours usually.  If not improving we can correspond or call / message me back in 2 weeks and we can refer to Orthopedics  Please schedule a Follow-up Appointment to: Return in about 2 weeks (around 02/09/2021), or if symptoms worsen or fail to improve, for back pain.  If you have any other questions or concerns, please feel free to call the office or send a message through MyChart. You may also schedule an earlier appointment if necessary.  Additionally, you may be receiving a survey about your experience at our office within a few days to 1 week by e-mail or mail. We value your feedback.  , DO [facility], 

## 2021-03-04 ENCOUNTER — Other Ambulatory Visit: Payer: Self-pay | Admitting: Family Medicine

## 2021-03-04 DIAGNOSIS — I129 Hypertensive chronic kidney disease with stage 1 through stage 4 chronic kidney disease, or unspecified chronic kidney disease: Secondary | ICD-10-CM

## 2021-03-04 NOTE — Telephone Encounter (Signed)
Requested Prescriptions  Pending Prescriptions Disp Refills  . hydrALAZINE (APRESOLINE) 25 MG tablet [Pharmacy Med Name: HYDRALAZINE  25MG  TABLETS(ORANGE)] 270 tablet 5    Sig: TAKE 3 TABLETS(75 MG) BY MOUTH THREE TIMES DAILY     Cardiovascular:  Vasodilators Failed - 03/04/2021  3:12 AM      Failed - HGB in normal range and within 360 days    Hemoglobin  Date Value Ref Range Status  04/07/2020 12.7 (L) 13.2 - 17.1 g/dL Final         Passed - HCT in normal range and within 360 days    HCT  Date Value Ref Range Status  04/07/2020 40.2 38.5 - 50.0 % Final         Passed - RBC in normal range and within 360 days    RBC  Date Value Ref Range Status  04/07/2020 4.68 4.20 - 5.80 Million/uL Final         Passed - WBC in normal range and within 360 days    WBC  Date Value Ref Range Status  04/07/2020 5.1 3.8 - 10.8 Thousand/uL Final         Passed - PLT in normal range and within 360 days    Platelets  Date Value Ref Range Status  04/07/2020 285 140 - 400 Thousand/uL Final         Passed - Last BP in normal range    BP Readings from Last 1 Encounters:  01/14/21 135/84         Passed - Valid encounter within last 12 months    Recent Outpatient Visits          1 month ago Acute left-sided low back pain with left-sided sciatica   [facility], DO   4 months ago Benign hypertension with CKD (chronic kidney disease) stage III [facility])   [facility], DO   10 months ago Benign hypertension with CKD (chronic kidney disease) stage III [facility])   [facility], DO   1 year ago Essential hypertension   [facility], [place], DO      Future Appointments            In 1 month Netta Neat, Althea Charon, DO [facility], [facility]

## 2021-03-15 ENCOUNTER — Other Ambulatory Visit: Payer: Self-pay | Admitting: Family Medicine

## 2021-03-15 DIAGNOSIS — N183 Chronic kidney disease, stage 3 unspecified: Secondary | ICD-10-CM

## 2021-03-15 DIAGNOSIS — I129 Hypertensive chronic kidney disease with stage 1 through stage 4 chronic kidney disease, or unspecified chronic kidney disease: Secondary | ICD-10-CM

## 2021-03-15 NOTE — Telephone Encounter (Signed)
Requested medications are due for refill today.  yes  Requested medications are on the active medications list.  yes  Last refill. 01/14/2021  Future visit scheduled.   yes  Notes to clinic.  Historical medication.

## 2021-03-31 ENCOUNTER — Other Ambulatory Visit: Payer: Self-pay

## 2021-03-31 DIAGNOSIS — Z125 Encounter for screening for malignant neoplasm of prostate: Secondary | ICD-10-CM

## 2021-03-31 DIAGNOSIS — R7309 Other abnormal glucose: Secondary | ICD-10-CM

## 2021-03-31 DIAGNOSIS — Z0000 Encounter for general adult medical examination without abnormal findings: Secondary | ICD-10-CM

## 2021-03-31 DIAGNOSIS — E782 Mixed hyperlipidemia: Secondary | ICD-10-CM

## 2021-03-31 DIAGNOSIS — D649 Anemia, unspecified: Secondary | ICD-10-CM

## 2021-03-31 DIAGNOSIS — Z114 Encounter for screening for human immunodeficiency virus [HIV]: Secondary | ICD-10-CM

## 2021-03-31 DIAGNOSIS — I129 Hypertensive chronic kidney disease with stage 1 through stage 4 chronic kidney disease, or unspecified chronic kidney disease: Secondary | ICD-10-CM

## 2021-03-31 DIAGNOSIS — Z1159 Encounter for screening for other viral diseases: Secondary | ICD-10-CM

## 2021-03-31 DIAGNOSIS — N183 Chronic kidney disease, stage 3 unspecified: Secondary | ICD-10-CM

## 2021-04-01 ENCOUNTER — Other Ambulatory Visit: Payer: 59

## 2021-04-01 ENCOUNTER — Other Ambulatory Visit: Payer: Self-pay

## 2021-04-02 LAB — COMPLETE METABOLIC PANEL WITH GFR
AG Ratio: 1.3 (calc) (ref 1.0–2.5)
ALT: 24 U/L (ref 9–46)
AST: 24 U/L (ref 10–35)
Albumin: 4 g/dL (ref 3.6–5.1)
Alkaline phosphatase (APISO): 76 U/L (ref 35–144)
BUN/Creatinine Ratio: 8 (calc) (ref 6–22)
BUN: 11 mg/dL (ref 7–25)
CO2: 24 mmol/L (ref 20–32)
Calcium: 8.7 mg/dL (ref 8.6–10.3)
Chloride: 107 mmol/L (ref 98–110)
Creat: 1.43 mg/dL — ABNORMAL HIGH (ref 0.70–1.30)
Globulin: 3.2 g/dL (calc) (ref 1.9–3.7)
Glucose, Bld: 98 mg/dL (ref 65–99)
Potassium: 3.8 mmol/L (ref 3.5–5.3)
Sodium: 142 mmol/L (ref 135–146)
Total Bilirubin: 0.9 mg/dL (ref 0.2–1.2)
Total Protein: 7.2 g/dL (ref 6.1–8.1)
eGFR: 58 mL/min/{1.73_m2} — ABNORMAL LOW (ref >=60)

## 2021-04-02 LAB — CBC WITH DIFFERENTIAL/PLATELET
Absolute Monocytes: 608 cells/uL (ref 200–950)
Basophils Absolute: 20 cells/uL (ref 0–200)
Basophils Relative: 0.4 %
Eosinophils Absolute: 358 cells/uL (ref 15–500)
Eosinophils Relative: 7.3 %
HCT: 38.9 % (ref 38.5–50.0)
Hemoglobin: 12.4 g/dL — ABNORMAL LOW (ref 13.2–17.1)
Lymphs Abs: 1651 cells/uL (ref 850–3900)
MCH: 27.9 pg (ref 27.0–33.0)
MCHC: 31.9 g/dL — ABNORMAL LOW (ref 32.0–36.0)
MCV: 87.6 fL (ref 80.0–100.0)
MPV: 10.7 fL (ref 7.5–12.5)
Monocytes Relative: 12.4 %
Neutro Abs: 2264 cells/uL (ref 1500–7800)
Neutrophils Relative %: 46.2 %
Platelets: 268 10*3/uL (ref 140–400)
RBC: 4.44 10*6/uL (ref 4.20–5.80)
RDW: 14 % (ref 11.0–15.0)
Total Lymphocyte: 33.7 %
WBC: 4.9 10*3/uL (ref 3.8–10.8)

## 2021-04-02 LAB — PSA: PSA: 0.41 ng/mL (ref <=4.00)

## 2021-04-02 LAB — LIPID PANEL
Cholesterol: 142 mg/dL (ref <200)
HDL: 38 mg/dL — ABNORMAL LOW (ref >=40)
LDL Cholesterol (Calc): 84 mg/dL (calc)
Non-HDL Cholesterol (Calc): 104 mg/dL (calc) (ref <130)
Total CHOL/HDL Ratio: 3.7 (calc) (ref <5.0)
Triglycerides: 107 mg/dL (ref <150)

## 2021-04-02 LAB — HEMOGLOBIN A1C
Hgb A1c MFr Bld: 5.3 % of total Hgb (ref <5.7)
Mean Plasma Glucose: 105 mg/dL
eAG (mmol/L): 5.8 mmol/L

## 2021-04-02 LAB — HIV ANTIBODY (ROUTINE TESTING W REFLEX): HIV 1&2 Ab, 4th Generation: NONREACTIVE

## 2021-04-02 LAB — TSH: TSH: 1.54 mIU/L (ref 0.40–4.50)

## 2021-04-02 LAB — HEPATITIS C ANTIBODY
Hepatitis C Ab: NONREACTIVE
SIGNAL TO CUT-OFF: 0.07 (ref <1.00)

## 2021-04-05 ENCOUNTER — Other Ambulatory Visit: Payer: Self-pay | Admitting: Family Medicine

## 2021-04-05 DIAGNOSIS — N183 Chronic kidney disease, stage 3 unspecified: Secondary | ICD-10-CM

## 2021-04-05 DIAGNOSIS — I129 Hypertensive chronic kidney disease with stage 1 through stage 4 chronic kidney disease, or unspecified chronic kidney disease: Secondary | ICD-10-CM

## 2021-04-05 NOTE — Telephone Encounter (Signed)
Requested Prescriptions  Pending Prescriptions Disp Refills  . carvedilol (COREG) 25 MG tablet [Pharmacy Med Name: CARVEDILOL 25MG  TABLETS] 180 tablet 1    Sig: TAKE 1 TABLET(25 MG) BY MOUTH TWICE DAILY WITH A MEAL     Cardiovascular:  Beta Blockers Passed - 04/05/2021 10:31 AM      Passed - Last BP in normal range    BP Readings from Last 1 Encounters:  01/14/21 135/84         Passed - Last Heart Rate in normal range    Pulse Readings from Last 1 Encounters:  01/14/21 65         Passed - Valid encounter within last 6 months    Recent Outpatient Visits          2 months ago Acute left-sided low back pain with left-sided sciatica   [facility], DO   6 months ago Benign hypertension with CKD (chronic kidney disease) stage III [facility])   [facility], DO   11 months ago Benign hypertension with CKD (chronic kidney disease) stage III [facility])   [facility], DO   1 year ago Essential hypertension   [facility] [place], [place], DO      Future Appointments            In 3 days Netta Neat, Althea Charon, DO [facility], [facility]

## 2021-04-08 ENCOUNTER — Encounter: Payer: Self-pay | Admitting: Family Medicine

## 2021-04-08 ENCOUNTER — Ambulatory Visit (INDEPENDENT_AMBULATORY_CARE_PROVIDER_SITE_OTHER): Payer: 59 | Admitting: Family Medicine

## 2021-04-08 ENCOUNTER — Other Ambulatory Visit: Payer: Self-pay

## 2021-04-08 VITALS — BP 144/82 | HR 66 | Ht 75.0 in | Wt 294.8 lb

## 2021-04-08 DIAGNOSIS — N183 Chronic kidney disease, stage 3 unspecified: Secondary | ICD-10-CM

## 2021-04-08 DIAGNOSIS — Z0000 Encounter for general adult medical examination without abnormal findings: Secondary | ICD-10-CM

## 2021-04-08 DIAGNOSIS — E782 Mixed hyperlipidemia: Secondary | ICD-10-CM | POA: Diagnosis not present

## 2021-04-08 DIAGNOSIS — I6930 Unspecified sequelae of cerebral infarction: Secondary | ICD-10-CM

## 2021-04-08 DIAGNOSIS — Z23 Encounter for immunization: Secondary | ICD-10-CM

## 2021-04-08 DIAGNOSIS — I129 Hypertensive chronic kidney disease with stage 1 through stage 4 chronic kidney disease, or unspecified chronic kidney disease: Secondary | ICD-10-CM | POA: Diagnosis not present

## 2021-04-08 NOTE — Assessment & Plan Note (Signed)
Improved BP - Home BP readings  Complication history CVA LLE Edema episodic   Plan:  1. Continue Amlodipine 10mg  2. Continue current BP regimen Carvedilol 25mg  BID, Hydralazine 75mg  (x 3 of 25mg  tab) TID, Lisinopril 20mg  daily 3. Encourage improved lifestyle - low sodium diet, regular exercise 4. Continue monitor BP outside office, bring readings to next visit, if persistently >140/90 or new symptoms notify office sooner

## 2021-04-08 NOTE — Assessment & Plan Note (Signed)
History of CVA 2017 Records reviewed He is on Statin therapy Has some sensation deficit L side chronic problem, left lower extremity foot affects his gait and balance at times but still very functional - discussed today, seems improved on Gabapentin 300mg  nightly less numbness. He is improving activity and muscle strength  Continues to have L Foot Drop Handicap placard given Encourage future consider work with PT or other options, ankle/foot drop brace

## 2021-04-08 NOTE — Patient Instructions (Addendum)
Thank you for coming to the office today.  Return in Spring 2023 for Shingrix shingles vaccine x 2 doses 2-6 months apart here or at pharmacy  Flu Shot today.  Keep an eye on BP, let me know if persistently elevated  Try to keep working on strengthening the Left foot  Please schedule a Follow-up Appointment to: Return in about 6 months (around 10/06/2021) for 6 month follow-up HTN, Post-CVA.  If you have any other questions or concerns, please feel free to call the office or send a message through MyChart. You may also schedule an earlier appointment if necessary.  Additionally, you may be receiving a survey about your experience at our office within a few days to 1 week by e-mail or mail. We value your feedback.  Saralyn Pilar, DO [facility], [place]

## 2021-04-08 NOTE — Assessment & Plan Note (Signed)
Controlled cholesterol on statin and lifestyle 03/2021  Plan: 1. Continue current meds - Atorvastatin 20mg  daily 2. Encourage improved lifestyle - low carb/cholesterol, reduce portion size, continue improving regular exercise Refill atorvastatin

## 2021-04-08 NOTE — Progress Notes (Signed)
Subjective:    Patient ID: Dalton Mathis, male    DOB: Jul 23, 1965, 55 y.o.   MRN: [id number]  Dalton Mathis is a 55 y.o. male presenting on 04/08/2021 for Annual Exam   HPI  Here for Annual Physical and Lab Review.  History of CVA  History of CVA in 2017,  Reduced sensation of Left inner foot, and also residual numbness often in Left upper extremity - In past he was on trial of Lyrica, without significant improvement Continues Gabapentin 331m nightly, seems to have some increased numbness in Left lower extremity. - He completed PT regimen post-CVA. 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 He will need handicap placard to help him due to limited mobility. Trying to work on strengthening, and he has problem with abnormal terrain.   CHRONIC HTN / CKD-III Prior history of labs CKD-III range Cr 1.5to 1.8 from prior PCP Last lab showed at Cr 1.43 - improved near baseline Home readings 120-140 Current Meds - Amlodipine 547mdaily, Carvedilol 2516mID, Hydralazine 88m50m 3 of 25mg36m) TID, Lisinopril 20mg 80my  Reports good compliance, did take meds today. Tolerating well, w/o complaints. Admits some lower extremity edema left lower leg - history of chronic recurrent swelling, he always has some numbness in lower ext, over past few days swelling did improve. He continues to drink water. Was not wearing compression. Denies CP, dyspnea, HA, dizziness / lightheadedness   Lifestyle A1c 5.3 Working more on lifestyle and stretching gym He is working on gut health with pre/probiotic Taking Metamucil powder for fiber  Hyperlipidemia Lab reviewed 03/2021 showed total 142 and LDL 84, near current goal Continues on Atorvastatin 20mg d63m   Health Maintenance:  PSA 0.41 negative  HIV negative screening Hep C negative screening  Due for Flu Shot, will  receive today   Return for Shingles vaccine in Spring 2023  Depression screen PHQ 2/9[facility]/17/2022 10/06/2020 04/14/2020  Decreased Interest 0 0 0  Down, Depressed, Hopeless 0 0 0  PHQ - 2 Score 0 0 0  Altered sleeping 0 0 -  Tired, decreased energy 0 0 -  Change in appetite 0 0 -  Feeling bad or failure about yourself  0 0 -  Trouble concentrating 0 0 -  Moving slowly or fidgety/restless 0 0 -  Suicidal thoughts 0 0 -  PHQ-9 Score 0 0 -  Difficult doing work/chores Not difficult at all Not difficult at all -    Past Medical History:  Diagnosis Date   Allergy    Hyperlipidemia    Hypertension 2003   Sleep apnea    Stroke (HCC) [id number]   Right basal ganglia thought secondary to hypertension.   Past Surgical History:  Procedure Laterality Date   COLONOSCOPY WITH PROPOFOL N/A 12/20/2016   Procedure: COLONOSCOPY WITH PROPOFOL;  Sur[facility];  Service: Endoscopy;  Laterality: N/A;   Social History   Socioeconomic History   Marital status: Single    Spouse name: Not on file   Number of children: Not on file   Years of education: Not on file   Highest education level: Not on file  Occupational History   Not on file  Tobacco Use   Smoking status: Never   Smokeless tobacco: Never  Substance and Sexual Activity   Alcohol use: Yes    Alcohol/week: 2.0 standard  drinks    Types: 2 Cans of beer per week   Drug use: No   Sexual activity: Not on file  Other Topics Concern   Not on file  Social History Narrative   Not on file   Social Determinants of Health   Financial Resource Strain: Not on file  Food Insecurity: Not on file  Transportation Needs: Not on file  Physical Activity: Not on file  Stress: Not on file  Social Connections: Not on file  Intimate Partner Violence: Not on file   Family History  Problem Relation Age of Onset   Colon cancer Neg Hx    Current Outpatient Medications on File Prior to Visit  Medication  Sig   amLODipine (NORVASC) 5 MG tablet TAKE 1 TABLET(5 MG) BY MOUTH DAILY   atorvastatin (LIPITOR) 20 MG tablet Take 1 tablet (20 mg total) by mouth at bedtime.   carvedilol (COREG) 25 MG tablet TAKE 1 TABLET(25 MG) BY MOUTH TWICE DAILY WITH A MEAL   cyclobenzaprine (FLEXERIL) 10 MG tablet Take 1 tablet (10 mg total) by mouth 2 (two) times daily as needed for muscle spasms.   hydrALAZINE (APRESOLINE) 25 MG tablet TAKE 3 TABLETS(75 MG) BY MOUTH THREE TIMES DAILY   lisinopril (ZESTRIL) 20 MG tablet Take 1 tablet (20 mg total) by mouth daily.   loratadine (CLARITIN) 10 MG tablet Take 10 mg by mouth daily.   gabapentin (NEURONTIN) 300 MG capsule Take 1 capsule (300 mg total) by mouth at bedtime. (Patient not taking: Reported on 01/26/2021)   No current facility-administered medications on file prior to visit.    Review of Systems  Constitutional:  Negative for activity change, appetite change, chills, diaphoresis, fatigue and fever.  HENT:  Negative for congestion and hearing loss.   Eyes:  Negative for visual disturbance.  Respiratory:  Negative for cough, chest tightness, shortness of breath and wheezing.   Cardiovascular:  Negative for chest pain, palpitations and leg swelling.  Gastrointestinal:  Negative for abdominal pain, constipation, diarrhea, nausea and vomiting.  Genitourinary:  Negative for dysuria, frequency and hematuria.  Musculoskeletal:  Negative for arthralgias and neck pain.  Skin:  Negative for rash.  Neurological:  Negative for dizziness, weakness, light-headedness, numbness and headaches.  Hematological:  Negative for adenopathy.  Psychiatric/Behavioral:  Negative for behavioral problems, dysphoric mood and sleep disturbance.   Per HPI unless specifically indicated above      Objective:    BP (!) 144/82 (BP Location: Left Arm, Cuff Size: Normal)   Pulse 66   Ht 6' 3"  (1.905 m)   Wt 294 lb 12.8 oz (133.7 kg)   SpO2 97%   BMI 36.85 kg/m   Wt Readings from Last 3  Encounters:  04/08/21 294 lb 12.8 oz (133.7 kg)  01/26/21 285 lb (129.3 kg)  10/06/20 281 lb (127.5 kg)    Physical Exam Vitals and nursing note reviewed.  Constitutional:      General: He is not in acute distress.    Appearance: He is well-developed. He is not diaphoretic.     Comments: Well-appearing, comfortable, cooperative  HENT:     Head: Normocephalic and atraumatic.  Eyes:     General:        Right eye: No discharge.        Left eye: No discharge.     Conjunctiva/sclera: Conjunctivae normal.     Pupils: Pupils are equal, round, and reactive to light.  Neck:     Thyroid: No thyromegaly.  Cardiovascular:  Rate and Rhythm: Normal rate and regular rhythm.     Pulses: Normal pulses.     Heart sounds: Normal heart sounds. No murmur heard. Pulmonary:     Effort: Pulmonary effort is normal. No respiratory distress.     Breath sounds: Normal breath sounds. No wheezing or rales.  Abdominal:     General: Bowel sounds are normal. There is no distension.     Palpations: Abdomen is soft. There is no mass.     Tenderness: There is no abdominal tenderness.  Musculoskeletal:        General: No tenderness. Normal range of motion.     Cervical back: Normal range of motion and neck supple.     Right lower leg: No edema.     Left lower leg: No edema.     Comments: Upper / Lower Extremities: - Normal muscle tone, strength bilateral upper extremities 5/5, lower extremities 5/5  Lymphadenopathy:     Cervical: No cervical adenopathy.  Skin:    General: Skin is warm and dry.     Findings: No erythema or rash.  Neurological:     Mental Status: He is alert and oriented to person, place, and time.     Comments: Distal sensation intact to light touch all extremities  Psychiatric:        Mood and Affect: Mood normal.        Behavior: Behavior normal.        Thought Content: Thought content normal.     Comments: Well groomed, good eye contact, normal speech and thoughts     Results  for orders placed or performed in visit on 03/31/21  HIV Antibody (routine testing w rflx)  Result Value Ref Range   HIV 1&2 Ab, 4th Generation NON-REACTIVE NON-REACTIVE  Hepatitis C antibody  Result Value Ref Range   Hepatitis C Ab NON-REACTIVE NON-REACTIVE   SIGNAL TO CUT-OFF 0.07 <1.00  TSH  Result Value Ref Range   TSH 1.54 0.40 - 4.50 mIU/L  PSA  Result Value Ref Range   PSA 0.41 < OR = 4.00 ng/mL  Lipid panel  Result Value Ref Range   Cholesterol 142 <200 mg/dL   HDL 38 (L) > OR = 40 mg/dL   Triglycerides 107 <150 mg/dL   LDL Cholesterol (Calc) 84 mg/dL (calc)   Total CHOL/HDL Ratio 3.7 <5.0 (calc)   Non-HDL Cholesterol (Calc) 104 <130 mg/dL (calc)  COMPLETE METABOLIC PANEL WITH GFR  Result Value Ref Range   Glucose, Bld 98 65 - 99 mg/dL   BUN 11 7 - 25 mg/dL   Creat 1.43 (H) 0.70 - 1.30 mg/dL   eGFR 58 (L) > OR = 60 mL/min/1.40m   BUN/Creatinine Ratio 8 6 - 22 (calc)   Sodium 142 135 - 146 mmol/L   Potassium 3.8 3.5 - 5.3 mmol/L   Chloride 107 98 - 110 mmol/L   CO2 24 20 - 32 mmol/L   Calcium 8.7 8.6 - 10.3 mg/dL   Total Protein 7.2 6.1 - 8.1 g/dL   Albumin 4.0 3.6 - 5.1 g/dL   Globulin 3.2 1.9 - 3.7 g/dL (calc)   AG Ratio 1.3 1.0 - 2.5 (calc)   Total Bilirubin 0.9 0.2 - 1.2 mg/dL   Alkaline phosphatase (APISO) 76 35 - 144 U/L   AST 24 10 - 35 U/L   ALT 24 9 - 46 U/L  CBC with Differential/Platelet  Result Value Ref Range   WBC 4.9 3.8 - 10.8 Thousand/uL   RBC  4.44 4.20 - 5.80 Million/uL   Hemoglobin 12.4 (L) 13.2 - 17.1 g/dL   HCT 38.9 38.5 - 50.0 %   MCV 87.6 80.0 - 100.0 fL   MCH 27.9 27.0 - 33.0 pg   MCHC 31.9 (L) 32.0 - 36.0 g/dL   RDW 14.0 11.0 - 15.0 %   Platelets 268 140 - 400 Thousand/uL   MPV 10.7 7.5 - 12.5 fL   Neutro Abs 2,264 1,500 - 7,800 cells/uL   Lymphs Abs 1,651 850 - 3,900 cells/uL   Absolute Monocytes 608 200 - 950 cells/uL   Eosinophils Absolute 358 15 - 500 cells/uL   Basophils Absolute 20 0 - 200 cells/uL   Neutrophils  Relative % 46.2 %   Total Lymphocyte 33.7 %   Monocytes Relative 12.4 %   Eosinophils Relative 7.3 %   Basophils Relative 0.4 %  Hemoglobin A1c  Result Value Ref Range   Hgb A1c MFr Bld 5.3 <5.7 % of total Hgb   Mean Plasma Glucose 105 mg/dL   eAG (mmol/L) 5.8 mmol/L      Assessment & Plan:   Problem List Items Addressed This Visit     Morbid obesity ([place])   Mixed hyperlipidemia    Controlled cholesterol on statin and lifestyle 03/2021  Plan: 1. Continue current meds - Atorvastatin 37m daily 2. Encourage improved lifestyle - low carb/cholesterol, reduce portion size, continue improving regular exercise Refill atorvastatin      History of cerebrovascular accident (CVA) with residual deficit    History of CVA 2017 Records reviewed He is on Statin therapy Has some sensation deficit L side chronic problem, left lower extremity foot affects his gait and balance at times but still very functional - discussed today, seems improved on Gabapentin 3068mnightly less numbness. He is improving activity and muscle strength  Continues to have L Foot Drop Handicap placard given Encourage future consider work with PT or other options, ankle/foot drop brace      Benign hypertension with CKD (chronic kidney disease) stage III (HCC)    Improved BP - Home BP readings  Complication history CVA LLE Edema episodic   Plan:  1. Continue Amlodipine 1071m. Continue current BP regimen Carvedilol 84m78mD, Hydralazine 75mg50m3 of 84mg 14m TID, Lisinopril 20mg d46m 3. Encourage improved lifestyle - low sodium diet, regular exercise 4. Continue monitor BP outside office, bring readings to next visit, if persistently >140/90 or new symptoms notify office sooner      Other Visit Diagnoses     Annual physical exam    -  Primary   Needs flu shot       Relevant Orders   Flu Vaccine QUAD 74mo+IM 474moarix, Fluzone & Alfiuria Quad PF) (Completed)       Updated Health Maintenance  information Flu Shot today COVID booster at pharmacy Shingrix in the Spring 2023 Reviewed recent lab results with patient Encouraged improvement to lifestyle with diet and exercise Goal of weight loss Handicap placard form completed today  No orders of the defined types were placed in this encounter.    Follow up plan: Return in about 6 months (around 10/06/2021) for 6 month follow-up HTN, Post-CVA.  [facility] 04/08/2021, 8:33 AM

## 2021-04-18 ENCOUNTER — Other Ambulatory Visit: Payer: Self-pay | Admitting: Family Medicine

## 2021-04-18 DIAGNOSIS — I129 Hypertensive chronic kidney disease with stage 1 through stage 4 chronic kidney disease, or unspecified chronic kidney disease: Secondary | ICD-10-CM

## 2021-04-19 NOTE — Telephone Encounter (Signed)
Requested Prescriptions  Pending Prescriptions Disp Refills  . lisinopril (ZESTRIL) 20 MG tablet [Pharmacy Med Name: LISINOPRIL 20MG  TABLETS] 90 tablet 1    Sig: TAKE 1 TABLET(20 MG) BY MOUTH DAILY     Cardiovascular:  ACE Inhibitors Failed - 04/18/2021 11:05 AM      Failed - Cr in normal range and within 180 days    Creat  Date Value Ref Range Status  04/01/2021 1.43 (H) 0.70 - 1.30 mg/dL Final         Failed - Last BP in normal range    BP Readings from Last 1 Encounters:  04/08/21 (!) 144/82         Passed - K in normal range and within 180 days    Potassium  Date Value Ref Range Status  04/01/2021 3.8 3.5 - 5.3 mmol/L Final         Passed - Patient is not pregnant      Passed - Valid encounter within last 6 months    Recent Outpatient Visits          1 week ago Annual physical exam   [facility], DO   2 months ago Acute left-sided low back pain with left-sided sciatica   [facility], DO   6 months ago Benign hypertension with CKD (chronic kidney disease) stage III [facility])   [facility], DO   1 year ago Benign hypertension with CKD (chronic kidney disease) stage III [facility])   [facility], DO   1 year ago Essential hypertension   [facility], [place], DO

## 2021-07-05 ENCOUNTER — Other Ambulatory Visit: Payer: Self-pay | Admitting: Family Medicine

## 2021-07-05 DIAGNOSIS — N183 Chronic kidney disease, stage 3 unspecified: Secondary | ICD-10-CM

## 2021-07-05 DIAGNOSIS — I129 Hypertensive chronic kidney disease with stage 1 through stage 4 chronic kidney disease, or unspecified chronic kidney disease: Secondary | ICD-10-CM

## 2021-07-06 NOTE — Telephone Encounter (Signed)
Refused due to duplicate request.  Requested Prescriptions  Pending Prescriptions Disp Refills   carvedilol (COREG) 25 MG tablet [Pharmacy Med Name: CARVEDILOL 25MG  TABLETS] 180 tablet 1    Sig: TAKE 1 TABLET(25 MG) BY MOUTH TWICE DAILY WITH A MEAL     Cardiovascular: Beta Blockers 3 Failed - 07/05/2021  8:09 AM      Failed - Cr in normal range and within 360 days    Creat  Date Value Ref Range Status  04/01/2021 1.43 (H) 0.70 - 1.30 mg/dL Final         Failed - Last BP in normal range    BP Readings from Last 1 Encounters:  04/08/21 (!) 144/82         Passed - AST in normal range and within 360 days    AST  Date Value Ref Range Status  04/01/2021 24 10 - 35 U/L Final         Passed - ALT in normal range and within 360 days    ALT  Date Value Ref Range Status  04/01/2021 24 9 - 46 U/L Final         Passed - Last Heart Rate in normal range    Pulse Readings from Last 1 Encounters:  04/08/21 66         Passed - Valid encounter within last 6 months    Recent Outpatient Visits          2 months ago Annual physical exam   [place], DO   5 months ago Acute left-sided low back pain with left-sided sciatica   [facility] [place], Devonne Doughty, DO   9 months ago Benign hypertension with CKD (chronic kidney disease) stage III [facility])   [facility] [place], DO   1 year ago Benign hypertension with CKD (chronic kidney disease) stage III [facility])   [facility] [place], DO   1 year ago Essential hypertension   [place], DO

## 2021-07-06 NOTE — Telephone Encounter (Signed)
Refused due to duplicate request. Requested Prescriptions  Pending Prescriptions Disp Refills   carvedilol (COREG) 25 MG tablet [Pharmacy Med Name: CARVEDILOL 25MG  TABLETS] 180 tablet 1    Sig: TAKE 1 TABLET(25 MG) BY MOUTH TWICE DAILY WITH A MEAL     Cardiovascular: Beta Blockers 3 Failed - 07/05/2021  8:09 AM      Failed - Cr in normal range and within 360 days    Creat  Date Value Ref Range Status  04/01/2021 1.43 (H) 0.70 - 1.30 mg/dL Final          Failed - Last BP in normal range    BP Readings from Last 1 Encounters:  04/08/21 (!) 144/82          Passed - AST in normal range and within 360 days    AST  Date Value Ref Range Status  04/01/2021 24 10 - 35 U/L Final          Passed - ALT in normal range and within 360 days    ALT  Date Value Ref Range Status  04/01/2021 24 9 - 46 U/L Final          Passed - Last Heart Rate in normal range    Pulse Readings from Last 1 Encounters:  04/08/21 66          Passed - Valid encounter within last 6 months    Recent Outpatient Visits           2 months ago Annual physical exam   [place], DO   5 months ago Acute left-sided low back pain with left-sided sciatica   [facility] [place], Devonne Doughty, DO   9 months ago Benign hypertension with CKD (chronic kidney disease) stage III [facility])   [facility] Olin Hauser, DO   1 year ago Benign hypertension with CKD (chronic kidney disease) stage III [facility])   [facility] Olin Hauser, DO   1 year ago Essential hypertension   [place], DO

## 2021-07-18 ENCOUNTER — Other Ambulatory Visit: Payer: Self-pay | Admitting: Family Medicine

## 2021-07-18 DIAGNOSIS — E782 Mixed hyperlipidemia: Secondary | ICD-10-CM

## 2021-07-19 NOTE — Telephone Encounter (Signed)
Requested Prescriptions  Pending Prescriptions Disp Refills   atorvastatin (LIPITOR) 20 MG tablet [Pharmacy Med Name: ATORVASTATIN 20MG  TABLETS] 90 tablet 1    Sig: TAKE 1 TABLET(20 MG) BY MOUTH AT BEDTIME     Cardiovascular:  Antilipid - Statins Failed - 07/18/2021  7:43 AM      Failed - Lipid Panel in normal range within the last 12 months    Cholesterol  Date Value Ref Range Status  04/01/2021 142 <200 mg/dL Final   LDL Cholesterol (Calc)  Date Value Ref Range Status  04/01/2021 84 mg/dL (calc) Final    Comment:    Reference range: <100 . Desirable range <100 mg/dL for primary prevention;   <70 mg/dL for patients with CHD or diabetic patients  with > or = 2 CHD risk factors. Marland Kitchen LDL-C is now calculated using the Martin-Hopkins  calculation, which is a validated novel method providing  better accuracy than the Friedewald equation in the  estimation of LDL-C.  Cresenciano Genre et al. Annamaria Helling. [id number]): 2061-2068  (http://education.[facility].com/faq/FAQ164)    HDL  Date Value Ref Range Status  04/01/2021 38 (L) > OR = 40 mg/dL Final   Triglycerides  Date Value Ref Range Status  04/01/2021 107 <150 mg/dL Final         Passed - Patient is not pregnant      Passed - Valid encounter within last 12 months    Recent Outpatient Visits          3 months ago Annual physical exam   [place], DO   5 months ago Acute left-sided low back pain with left-sided sciatica   [facility] Olin Hauser, DO   9 months ago Benign hypertension with CKD (chronic kidney disease) stage III [facility])   [facility] Olin Hauser, DO   1 year ago Benign hypertension with CKD (chronic kidney disease) stage III [facility])   [facility] Olin Hauser, DO   1 year ago Essential hypertension   [place], Devonne Doughty, DO

## 2021-09-07 ENCOUNTER — Other Ambulatory Visit: Payer: Self-pay | Admitting: Family Medicine

## 2021-09-07 DIAGNOSIS — I129 Hypertensive chronic kidney disease with stage 1 through stage 4 chronic kidney disease, or unspecified chronic kidney disease: Secondary | ICD-10-CM

## 2021-09-07 NOTE — Telephone Encounter (Signed)
Requested medications are due for refill today.  yes ? ?Requested medications are on the active medications list.  yes ? ?Last refill. 03/04/2021 #270 5 refills ? ?Future visit scheduled.   no ? ?Notes to clinic.  Failed refill protocol due to abnormal/ missing labs. ? ? ? ?Requested Prescriptions  ?Pending Prescriptions Disp Refills  ? hydrALAZINE (APRESOLINE) 25 MG tablet [Pharmacy Med Name: HYDRALAZINE  25MG  TABLETS(ORANGE)] 270 tablet 5  ?  Sig: TAKE 3 TABLETS(75 MG) BY MOUTH THREE TIMES DAILY  ?  ? Cardiovascular:  Vasodilators Failed - 09/07/2021  8:09 AM  ?  ?  Failed - HGB in normal range and within 360 days  ?  Hemoglobin  ?Date Value Ref Range Status  ?04/01/2021 12.4 (L) 13.2 - 17.1 g/dL Final  ?  ?  ?  ?  Failed - ANA Screen, Ifa, Serum in normal range and within 360 days  ?  No results found for: ANA, ANATITER, LABANTI  ?  ?  ?  Failed - Last BP in normal range  ?  BP Readings from Last 1 Encounters:  ?04/08/21 (!) 144/82  ?  ?  ?  ?  Passed - HCT in normal range and within 360 days  ?  HCT  ?Date Value Ref Range Status  ?04/01/2021 38.9 38.5 - 50.0 % Final  ?  ?  ?  ?  Passed - RBC in normal range and within 360 days  ?  RBC  ?Date Value Ref Range Status  ?04/01/2021 4.44 4.20 - 5.80 Million/uL Final  ?  ?  ?  ?  Passed - WBC in normal range and within 360 days  ?  WBC  ?Date Value Ref Range Status  ?04/01/2021 4.9 3.8 - 10.8 Thousand/uL Final  ?  ?  ?  ?  Passed - PLT in normal range and within 360 days  ?  Platelets  ?Date Value Ref Range Status  ?04/01/2021 268 140 - 400 Thousand/uL Final  ?  ?  ?  ?  Passed - Valid encounter within last 12 months  ?  Recent Outpatient Visits   ? ?      ? 5 months ago Annual physical exam  ? [place], DO  ? 7 months ago Acute left-sided low back pain with left-sided sciatica  ? [place], DO  ? 11 months ago Benign hypertension with CKD (chronic kidney disease) stage III ([place])  ?  [place], DO  ? 1 year ago Benign hypertension with CKD (chronic kidney disease) stage III ([place])  ? [place], DO  ? 1 year ago Essential hypertension  ? [place], DO  ? ?  ?  ? ? ?  ?  ?  ?  ?

## 2021-10-05 ENCOUNTER — Ambulatory Visit (INDEPENDENT_AMBULATORY_CARE_PROVIDER_SITE_OTHER): Payer: 59

## 2021-10-05 ENCOUNTER — Ambulatory Visit
Admission: RE | Admit: 2021-10-05 | Discharge: 2021-10-05 | Disposition: A | Discharge status: Home / Self Care | Payer: 59 | Source: Ambulatory Visit | Attending: Emergency Medicine | Admitting: Emergency Medicine

## 2021-10-05 VITALS — BP 135/85 | HR 67 | Temp 98.2°F | Resp 18

## 2021-10-05 DIAGNOSIS — M25512 Pain in left shoulder: Secondary | ICD-10-CM

## 2021-10-05 DIAGNOSIS — W19XXXA Unspecified fall, initial encounter: Secondary | ICD-10-CM | POA: Diagnosis not present

## 2021-10-05 NOTE — Discharge Instructions (Addendum)
Rest your shoulder.  Apply ice packs 2-3 times a day for up to 20 minutes each.    ? ?Follow up with an orthopedist if your symptoms are not improving.   ? ?

## 2021-10-05 NOTE — ED Provider Notes (Signed)
?[facility] ? ? ? ?CSN: [id number] ?Arrival date & time: 10/05/21  1548 ? ? ?  ? ?History   ?Chief Complaint ?Chief Complaint  ?Patient presents with  ? Shoulder Pain  ?  Left shoulder pain. Experiencing sharp pain and tingling sensation in my left hand, palm, down to my fingers, and fingertips.  More intense my fingers spread, and tight fist. there is pain (level 5) hypersensitive to light touch. - Entered by patient  ? ? ?HPI ?Dalton Mathis is a 56 y.o. male.  Patient presents with pain in his left shoulder since he fell while playing football on 10/02/2021.  He has neuropathy at baseline but states tingling is worse.  No wounds, redness, bruising, swelling, or other symptoms.  He has noticed swelling in his left hand today.  No fever, chest pain, shortness of breath, or other symptoms.  His medical history includes hypertension, CVA, peripheral neuropathy, morbid obesity. ? ?The history is provided by the patient and medical records.  ? ?Past Medical History:  ?Diagnosis Date  ? Allergy   ? Hyperlipidemia   ? Hypertension 2003  ? Sleep apnea   ? Stroke [facility]) 12/2015  ? Right basal ganglia thought secondary to hypertension.  ? ? ?Patient Active Problem List  ? Diagnosis Date Noted  ? Normocytic anemia 04/14/2020  ? Peripheral neuropathy 04/14/2020  ? History of cerebrovascular accident (CVA) with residual deficit 03/03/2020  ? Mixed hyperlipidemia 03/03/2020  ? Benign hypertension with CKD (chronic kidney disease) stage III ([place]) 03/03/2020  ? Morbid obesity ([place]) 03/03/2020  ? Encounter for screening colonoscopy 11/15/2016  ? ? ?Past Surgical History:  ?Procedure Laterality Date  ? COLONOSCOPY WITH PROPOFOL N/A 12/20/2016  ? Procedure: COLONOSCOPY WITH PROPOFOL;  Surgeon: Robert Bellow, MD;  [facility] ENDOSCOPY;  Service: Endoscopy;  Laterality: N/A;  ? ? ? ? ? ?Home Medications   ? ?Prior to Admission medications   ?Medication Sig Start Date End Date Taking? Authorizing Provider  ?amLODipine  (NORVASC) 5 MG tablet TAKE 1 TABLET(5 MG) BY MOUTH DAILY 03/16/21   [place], Devonne Doughty, DO  ?atorvastatin (LIPITOR) 20 MG tablet TAKE 1 TABLET(20 MG) BY MOUTH AT BEDTIME 07/19/21   [place], Devonne Doughty, DO  ?carvedilol (COREG) 25 MG tablet TAKE 1 TABLET(25 MG) BY MOUTH TWICE DAILY WITH A MEAL 04/05/21   [place], Devonne Doughty, DO  ?cyclobenzaprine (FLEXERIL) 10 MG tablet Take 1 tablet (10 mg total) by mouth 2 (two) times daily as needed for muscle spasms. 01/26/21   [place], Devonne Doughty, DO  ?gabapentin (NEURONTIN) 300 MG capsule Take 1 capsule (300 mg total) by mouth at bedtime. ?Patient not taking: Reported on 01/26/2021 04/14/20   Olin Hauser, DO  ?hydrALAZINE (APRESOLINE) 25 MG tablet TAKE 3 TABLETS(75 MG) BY MOUTH THREE TIMES DAILY 09/07/21   [place], Devonne Doughty, DO  ?lisinopril (ZESTRIL) 20 MG tablet TAKE 1 TABLET(20 MG) BY MOUTH DAILY 04/19/21   [place], Devonne Doughty, DO  ?loratadine (CLARITIN) 10 MG tablet Take 10 mg by mouth daily.    [provider]  ? ? ?Family History ?Family History  ?Problem Relation Age of Onset  ? Colon cancer Neg Hx   ? ? ?Social History ?Social History  ? ?Tobacco Use  ? Smoking status: Never  ? Smokeless tobacco: Never  ?Substance Use Topics  ? Alcohol use: Yes  ?  Alcohol/week: 2.0 standard drinks  ?  Types: 2 Cans of beer per week  ? Drug use: No  ? ? ? ?  Allergies   ?Patient has no known allergies. ? ? ?Review of Systems ?Review of Systems  ?Constitutional:  Negative for chills and fever.  ?Respiratory:  Negative for cough and shortness of breath.   ?Cardiovascular:  Negative for chest pain and palpitations.  ?Musculoskeletal:  Positive for arthralgias and joint swelling.  ?Skin:  Negative for color change, rash and wound.  ?Neurological:  Negative for weakness and numbness.  ?All other systems reviewed and are negative. ? ? ?Physical Exam ?Triage Vital Signs ?ED Triage Vitals  ?Enc Vitals Group  ?   BP   ?   Pulse   ?   Resp   ?   Temp    ?   Temp src   ?   SpO2   ?   Weight   ?   Height   ?   Head Circumference   ?   Peak Flow   ?   Pain Score   ?   Pain Loc   ?   Pain Edu?   ?   Excl. in [place]?   ? ?No data found. ? ?Updated Vital Signs ?BP 135/85 (BP Location: Left Arm)   Pulse 67   Temp 98.2 ?F (36.8 ?C) (Temporal)   Resp 18   SpO2 98%  ? ?Visual Acuity ?Right Eye Distance:   ?Left Eye Distance:   ?Bilateral Distance:   ? ?Right Eye Near:   ?Left Eye Near:    ?Bilateral Near:    ? ?Physical Exam ?Vitals and nursing note reviewed.  ?Constitutional:   ?   General: He is not in acute distress. ?   Appearance: He is well-developed. He is obese. He is not ill-appearing.  ?HENT:  ?   Mouth/Throat:  ?   Mouth: Mucous membranes are moist.  ?Cardiovascular:  ?   Rate and Rhythm: Normal rate and regular rhythm.  ?   Heart sounds: Normal heart sounds.  ?Pulmonary:  ?   Effort: Pulmonary effort is normal. No respiratory distress.  ?   Breath sounds: Normal breath sounds.  ?Musculoskeletal:     ?   General: No swelling, tenderness, deformity or signs of injury. Normal range of motion.  ?   Cervical back: Neck supple.  ?Skin: ?   General: Skin is warm and dry.  ?   Capillary Refill: Capillary refill takes less than 2 seconds.  ?   Findings: No bruising, erythema, lesion or rash.  ?Neurological:  ?   General: No focal deficit present.  ?   Mental Status: He is alert and oriented to person, place, and time.  ?   Sensory: No sensory deficit.  ?   Motor: No weakness.  ?   Gait: Gait normal.  ?Psychiatric:     ?   Mood and Affect: Mood normal.     ?   Behavior: Behavior normal.  ? ? ? ?UC Treatments / Results  ?Labs ?(all labs ordered are listed, but only abnormal results are displayed) ?Labs Reviewed - No data to display ? ?EKG ? ? ?Radiology ?DG Shoulder Left ? ?Result Date: 10/05/2021 ?CLINICAL DATA:  Left shoulder pain after fall EXAM: LEFT SHOULDER - 2+ VIEW COMPARISON:  None Available. FINDINGS: There is no evidence of fracture or dislocation. Moderate  arthropathy of the left glenohumeral and acromioclavicular joints. Soft tissues are unremarkable. IMPRESSION: 1. No fracture or dislocation of the left shoulder. 2. 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 Electronically Signed   By: Davina Poke D.O.  On: 10/05/2021 16:27   ? ?Procedures ?Procedures (including critical care time) ? ?Medications Ordered in UC ?Medications - No data to display ? ?Initial Impression / Assessment and Plan / UC Course  ?I have reviewed the triage vital signs and the nursing notes. ? ?Pertinent labs & imaging results that were available during my care of the patient were reviewed by me and considered in my medical decision making (see chart for details). ? ? Left shoulder pain.  X-ray shows no acute bony abnormality.  Treating with rest, ice packs, Tylenol.  Patient is unable to take NSAIDs due to his medical history.  Discussed follow-up with orthopedics if his symptoms are not improving.  Contact information for [facility] provided as they are on call today.  Patient agrees to plan of care. ? ? ? ?Final Clinical Impressions(s) / UC Diagnoses  ? ?Final diagnoses:  ?Acute pain of left shoulder  ? ? ? ?Discharge Instructions   ? ?  ?Rest your shoulder.  Apply ice packs 2-3 times a day for up to 20 minutes each.    ? ?Follow up with an orthopedist if your symptoms are not improving.   ? ? ? ? ? ?ED Prescriptions   ?None ?  ? ?I have reviewed the PDMP during this encounter. ?  ?Sharion Balloon, NP ?10/05/21 1639 ? ?

## 2021-10-05 NOTE — ED Triage Notes (Signed)
Patient presents to Urgent Care with complaints of left shoulder pain since Sunday. He states Saturday he was playing touch/football and fell. He reports sharp pain and tingling sensation in left hand that worsens when making a fist or spreading his fingers. Treating pain Tylenol.  ?

## 2021-10-15 ENCOUNTER — Other Ambulatory Visit: Payer: Self-pay | Admitting: Family Medicine

## 2021-10-15 DIAGNOSIS — E782 Mixed hyperlipidemia: Secondary | ICD-10-CM

## 2021-10-19 NOTE — Telephone Encounter (Signed)
Requested Prescriptions  Pending Prescriptions Disp Refills  . atorvastatin (LIPITOR) 20 MG tablet [Pharmacy Med Name: ATORVASTATIN 20MG  TABLETS] 90 tablet 1    Sig: TAKE 1 TABLET(20 MG) BY MOUTH AT BEDTIME     Cardiovascular:  Antilipid - Statins Failed - 10/15/2021  3:11 AM      Failed - Lipid Panel in normal range within the last 12 months    Cholesterol  Date Value Ref Range Status  04/01/2021 142 <200 mg/dL Final   LDL Cholesterol (Calc)  Date Value Ref Range Status  04/01/2021 84 mg/dL (calc) Final    Comment:    Reference range: <100 . Desirable range <100 mg/dL for primary prevention;   <70 mg/dL for patients with CHD or diabetic patients  with > or = 2 CHD risk factors. Marland Kitchen LDL-C is now calculated using the Martin-Hopkins  calculation, which is a validated novel method providing  better accuracy than the Friedewald equation in the  estimation of LDL-C.  Cresenciano Genre et al. Annamaria Helling. [id number]): 2061-2068  (http://education.[facility].com/faq/FAQ164)    HDL  Date Value Ref Range Status  04/01/2021 38 (L) > OR = 40 mg/dL Final   Triglycerides  Date Value Ref Range Status  04/01/2021 107 <150 mg/dL Final         Passed - Patient is not pregnant      Passed - Valid encounter within last 12 months    Recent Outpatient Visits          6 months ago Annual physical exam   [place], DO   8 months ago Acute left-sided low back pain with left-sided sciatica   [facility] Olin [facility]er, DO   1 year ago Benign hypertension with CKD (chronic kidney disease) stage III [facility])   [facility] Olin Hauser, DO   1 year ago Benign hypertension with CKD (chronic kidney disease) stage III [facility])   [facility] Olin Hauser, DO   1 year ago Essential hypertension   [place], Devonne Doughty, DO

## 2021-10-19 NOTE — Telephone Encounter (Signed)
Patient called, left VM to return the call to the office to schedule 6 month f/u, noted to return in May at last OV.

## 2021-10-21 ENCOUNTER — Other Ambulatory Visit: Payer: Self-pay | Admitting: Family Medicine

## 2021-10-21 DIAGNOSIS — E782 Mixed hyperlipidemia: Secondary | ICD-10-CM

## 2021-10-21 NOTE — Telephone Encounter (Signed)
Resent, RX not received by pharmacy 10/19/21 Requested Prescriptions  Pending Prescriptions Disp Refills  . atorvastatin (LIPITOR) 20 MG tablet [Pharmacy Med Name: ATORVASTATIN 20MG  TABLETS] 90 tablet 1    Sig: TAKE 1 TABLET(20 MG) BY MOUTH AT BEDTIME     Cardiovascular:  Antilipid - Statins Failed - 10/21/2021  7:08 AM      Failed - Lipid Panel in normal range within the last 12 months    Cholesterol  Date Value Ref Range Status  04/01/2021 142 <200 mg/dL Final   LDL Cholesterol (Calc)  Date Value Ref Range Status  04/01/2021 84 mg/dL (calc) Final    Comment:    Reference range: <100 . Desirable range <100 mg/dL for primary prevention;   <70 mg/dL for patients with CHD or diabetic patients  with > or = 2 CHD risk factors. Marland Kitchen LDL-C is now calculated using the Martin-Hopkins  calculation, which is a validated novel method providing  better accuracy than the Friedewald equation in the  estimation of LDL-C.  Cresenciano Genre et al. Annamaria Helling. [id number]): 2061-2068  (http://education.[facility].com/faq/FAQ164)    HDL  Date Value Ref Range Status  04/01/2021 38 (L) > OR = 40 mg/dL Final   Triglycerides  Date Value Ref Range Status  04/01/2021 107 <150 mg/dL Final         Passed - Patient is not pregnant      Passed - Valid encounter within last 12 months    Recent Outpatient Visits          6 months ago Annual physical exam   [place], DO   8 months ago Acute left-sided low back pain with left-sided sciatica   [facility] Olin Hauser, DO   1 year ago Benign hypertension with CKD (chronic kidney disease) stage III [facility])   [facility] Olin Hauser, DO   1 year ago Benign hypertension with CKD (chronic kidney disease) stage III [facility])   [facility] Olin Hauser, DO   1 year ago Essential hypertension   [place], Devonne Doughty, DO

## 2021-10-23 ENCOUNTER — Other Ambulatory Visit: Payer: Self-pay | Admitting: Family Medicine

## 2021-10-23 DIAGNOSIS — I129 Hypertensive chronic kidney disease with stage 1 through stage 4 chronic kidney disease, or unspecified chronic kidney disease: Secondary | ICD-10-CM

## 2021-10-25 NOTE — Telephone Encounter (Signed)
Requested medication (s) are due for refill today: yes  Requested medication (s) are on the active medication list: yes  Last refill:  04/19/21  Future visit scheduled: no  Notes to clinic:  Unable to refill per protocol, appointment needed.      Requested Prescriptions  Pending Prescriptions Disp Refills   lisinopril (ZESTRIL) 20 MG tablet [Pharmacy Med Name: LISINOPRIL 20MG  TABLETS] 90 tablet 1    Sig: TAKE 1 TABLET(20 MG) BY MOUTH DAILY     Cardiovascular:  ACE Inhibitors Failed - 10/23/2021  8:53 AM      Failed - Cr in normal range and within 180 days    Creat  Date Value Ref Range Status  04/01/2021 1.43 (H) 0.70 - 1.30 mg/dL Final         Failed - K in normal range and within 180 days    Potassium  Date Value Ref Range Status  04/01/2021 3.8 3.5 - 5.3 mmol/L Final         Failed - Valid encounter within last 6 months    Recent Outpatient Visits           6 months ago Annual physical exam   [place], DO   9 months ago Acute left-sided low back pain with left-sided sciatica   [facility] Olin Hauser, DO   1 year ago Benign hypertension with CKD (chronic kidney disease) stage III [facility])   [facility] Olin Hauser, DO   1 year ago Benign hypertension with CKD (chronic kidney disease) stage III [facility])   [facility] Olin Hauser, DO   1 year ago Essential hypertension   [place], [place]               Passed - Patient is not pregnant      Passed - Last BP in normal range    BP Readings from Last 1 Encounters:  10/05/21 135/85

## 2021-11-07 ENCOUNTER — Other Ambulatory Visit: Payer: Self-pay | Admitting: Family Medicine

## 2021-11-07 DIAGNOSIS — N183 Chronic kidney disease, stage 3 unspecified: Secondary | ICD-10-CM

## 2021-11-08 NOTE — Telephone Encounter (Signed)
Requested Prescriptions  Pending Prescriptions Disp Refills  . carvedilol (COREG) 25 MG tablet [Pharmacy Med Name: CARVEDILOL 25MG  TABLETS] 180 tablet 0    Sig: TAKE 1 TABLET(25 MG) BY MOUTH TWICE DAILY WITH A MEAL     Cardiovascular: Beta Blockers 3 Failed - 11/07/2021  3:47 PM      Failed - Cr in normal range and within 360 days    Creat  Date Value Ref Range Status  04/01/2021 1.43 (H) 0.70 - 1.30 mg/dL Final         Failed - Valid encounter within last 6 months    Recent Outpatient Visits          7 months ago Annual physical exam   [facility], DO   9 months ago Acute left-sided low back pain with left-sided sciatica   [facility], DO   1 year ago Benign hypertension with CKD (chronic kidney disease) stage III [facility])   [facility], DO   1 year ago Benign hypertension with CKD (chronic kidney disease) stage III [facility])   [facility], DO   1 year ago Essential hypertension   [facility] [place], [place], DO      Future Appointments            In 2 weeks Netta Neat, Althea Charon, DO [facility]           Passed - AST in normal range and within 360 days    AST  Date Value Ref Range Status  04/01/2021 24 10 - 35 U/L Final         Passed - ALT in normal range and within 360 days    ALT  Date Value Ref Range Status  04/01/2021 24 9 - 46 U/L Final         Passed - Last BP in normal range    BP Readings from Last 1 Encounters:  10/05/21 135/85         Passed - Last Heart Rate in normal range    Pulse Readings from Last 1 Encounters:  10/05/21 67

## 2021-11-26 ENCOUNTER — Ambulatory Visit: Payer: Medicaid Other | Admitting: Family Medicine

## 2021-12-03 ENCOUNTER — Encounter: Payer: Self-pay | Admitting: Family Medicine

## 2021-12-03 ENCOUNTER — Ambulatory Visit (INDEPENDENT_AMBULATORY_CARE_PROVIDER_SITE_OTHER): Payer: 59 | Admitting: Family Medicine

## 2021-12-03 ENCOUNTER — Other Ambulatory Visit: Payer: Self-pay | Admitting: Family Medicine

## 2021-12-03 VITALS — BP 144/79 | HR 65 | Ht 75.0 in | Wt 294.4 lb

## 2021-12-03 DIAGNOSIS — M5442 Lumbago with sciatica, left side: Secondary | ICD-10-CM | POA: Diagnosis not present

## 2021-12-03 DIAGNOSIS — R7309 Other abnormal glucose: Secondary | ICD-10-CM

## 2021-12-03 DIAGNOSIS — Z0000 Encounter for general adult medical examination without abnormal findings: Secondary | ICD-10-CM

## 2021-12-03 DIAGNOSIS — Z125 Encounter for screening for malignant neoplasm of prostate: Secondary | ICD-10-CM

## 2021-12-03 DIAGNOSIS — E782 Mixed hyperlipidemia: Secondary | ICD-10-CM

## 2021-12-03 DIAGNOSIS — I129 Hypertensive chronic kidney disease with stage 1 through stage 4 chronic kidney disease, or unspecified chronic kidney disease: Secondary | ICD-10-CM

## 2021-12-03 DIAGNOSIS — I6930 Unspecified sequelae of cerebral infarction: Secondary | ICD-10-CM

## 2021-12-03 DIAGNOSIS — D649 Anemia, unspecified: Secondary | ICD-10-CM

## 2021-12-03 MED ORDER — CYCLOBENZAPRINE HCL 10 MG PO TABS: 10.0000 mg | ORAL_TABLET | Freq: Every evening | ORAL | 2 refills | Status: DC | PRN

## 2021-12-03 MED ORDER — PREDNISONE 10 MG PO TABS: ORAL_TABLET | ORAL | 0 refills | Status: DC

## 2021-12-03 NOTE — Progress Notes (Signed)
Subjective:    Patient ID: Dalton Mathis, male    DOB: 10-Mar-1966, 56 y.o.   MRN: [id number]  Dalton Mathis is a 56 y.o. male presenting on 12/03/2021 for Sciatica   HPI  Sciatica, low back pain, Left  Left sided numbness, persistent or more chronic He noticed new problem on 11/20/21 No trauma or injury or fall He did have increased   He had some leftover Flexeril PRN, taking at PM. It makes him feel a little groggy.  Previous treatment 12/2020 w prednisone taper flexeril resolved  Today is improved back and leg pain, he has had improvement in function and activity.  Gabapentin was not effective for the chronic numbness. No longer taking.  History of CVA with chronic residual left sided upper lower ext numbness      04/08/2021    8:25 AM 10/06/2020    8:03 AM 04/14/2020    8:28 AM  Depression screen PHQ 2/9  Decreased Interest 0 0 0  Down, Depressed, Hopeless 0 0 0  PHQ - 2 Score 0 0 0  Altered sleeping 0 0   Tired, decreased energy 0 0   Change in appetite 0 0   Feeling bad or failure about yourself  0 0   Trouble concentrating 0 0   Moving slowly or fidgety/restless 0 0   Suicidal thoughts 0 0   PHQ-9 Score 0 0   Difficult doing work/chores Not difficult at all Not difficult at all     Social History   Tobacco Use   Smoking status: Never   Smokeless tobacco: Never  Substance Use Topics   Alcohol use: Yes    Alcohol/week: 2.0 standard drinks of alcohol    Types: 2 Cans of beer per week   Drug use: No    Review of Systems Per HPI unless specifically indicated above     Objective:    BP (!) 144/79   Pulse 65   Ht _0  (1.905 m)   Wt 294 lb 6.4 oz (133.5 kg)   SpO2 98%   BMI 36.80 kg/m   Wt Readings from Last 3 Encounters:  12/03/21 294 lb 6.4 oz (133.5 kg)  04/08/21 294 lb 12.8 oz (133.7 kg)  01/26/21 285 lb (129.3 kg)    Physical Exam Vitals and nursing note reviewed.  Constitutional:      General: He is not in acute distress.     Appearance: Normal appearance. He is well-developed. He is not diaphoretic.     Comments: Well-appearing, comfortable, cooperative  HENT:     Head: Normocephalic and atraumatic.  Eyes:     General:        Right eye: No discharge.        Left eye: No discharge.     Conjunctiva/sclera: Conjunctivae normal.  Cardiovascular:     Rate and Rhythm: Normal rate.  Pulmonary:     Effort: Pulmonary effort is normal.  Skin:    General: Skin is warm and dry.     Findings: No erythema or rash.  Neurological:     Mental Status: He is alert and oriented to person, place, and time.  Psychiatric:        Mood and Affect: Mood normal.        Behavior: Behavior normal.        Thought Content: Thought content normal.     Comments: Well groomed, good eye contact, normal speech and thoughts    Results for orders placed or performed in  visit on 03/31/21  HIV Antibody (routine testing w rflx)  Result Value Ref Range   HIV 1&2 Ab, 4th Generation NON-REACTIVE NON-REACTIVE  Hepatitis C antibody  Result Value Ref Range   Hepatitis C Ab NON-REACTIVE NON-REACTIVE   SIGNAL TO CUT-OFF 0.07 <1.00  TSH  Result Value Ref Range   TSH 1.54 0.40 - 4.50 mIU/L  PSA  Result Value Ref Range   PSA 0.41 < OR = 4.00 ng/mL  Lipid panel  Result Value Ref Range   Cholesterol 142 <200 mg/dL   HDL 38 (L) > OR = 40 mg/dL   Triglycerides 107 <150 mg/dL   LDL Cholesterol (Calc) 84 mg/dL (calc)   Total CHOL/HDL Ratio 3.7 <5.0 (calc)   Non-HDL Cholesterol (Calc) 104 <130 mg/dL (calc)  COMPLETE METABOLIC PANEL WITH GFR  Result Value Ref Range   Glucose, Bld 98 65 - 99 mg/dL   BUN 11 7 - 25 mg/dL   Creat 1.43 (H) 0.70 - 1.30 mg/dL   eGFR 58 (L) > OR = 60 mL/min/1.40m   BUN/Creatinine Ratio 8 6 - 22 (calc)   Sodium 142 135 - 146 mmol/L   Potassium 3.8 3.5 - 5.3 mmol/L   Chloride 107 98 - 110 mmol/L   CO2 24 20 - 32 mmol/L   Calcium 8.7 8.6 - 10.3 mg/dL   Total Protein 7.2 6.1 - 8.1 g/dL   Albumin 4.0 3.6 - 5.1 g/dL    Globulin 3.2 1.9 - 3.7 g/dL (calc)   AG Ratio 1.3 1.0 - 2.5 (calc)   Total Bilirubin 0.9 0.2 - 1.2 mg/dL   Alkaline phosphatase (APISO) 76 35 - 144 U/L   AST 24 10 - 35 U/L   ALT 24 9 - 46 U/L  CBC with Differential/Platelet  Result Value Ref Range   WBC 4.9 3.8 - 10.8 Thousand/uL   RBC 4.44 4.20 - 5.80 Million/uL   Hemoglobin 12.4 (L) 13.2 - 17.1 g/dL   HCT 38.9 38.5 - 50.0 %   MCV 87.6 80.0 - 100.0 fL   MCH 27.9 27.0 - 33.0 pg   MCHC 31.9 (L) 32.0 - 36.0 g/dL   RDW 14.0 11.0 - 15.0 %   Platelets 268 140 - 400 Thousand/uL   MPV 10.7 7.5 - 12.5 fL   Neutro Abs 2,264 1,500 - 7,800 cells/uL   Lymphs Abs 1,651 850 - 3,900 cells/uL   Absolute Monocytes 608 200 - 950 cells/uL   Eosinophils Absolute 358 15 - 500 cells/uL   Basophils Absolute 20 0 - 200 cells/uL   Neutrophils Relative % 46.2 %   Total Lymphocyte 33.7 %   Monocytes Relative 12.4 %   Eosinophils Relative 7.3 %   Basophils Relative 0.4 %  Hemoglobin A1c  Result Value Ref Range   Hgb A1c MFr Bld 5.3 <5.7 % of total Hgb   Mean Plasma Glucose 105 mg/dL   eAG (mmol/L) 5.8 mmol/L      Assessment & Plan:   Problem List Items Addressed This Visit   None Visit Diagnoses     Acute left-sided low back pain with left-sided sciatica    -  Primary   Relevant Medications   cyclobenzaprine (FLEXERIL) 10 MG tablet   predniSONE (DELTASONE) 10 MG tablet   Other Relevant Orders   DG Lumbar Spine Complete       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 Suspect likely due to muscle spasm/strain without known injury or trauma.  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 Limited w/ NSAID due to CKD, avoiding oral NSAID 2. Order Prednisone taper 6 day, if needed - may hold if not needing anymore 3. Re order Flexeril 10-33m QHS PRN 4. Tylenol TID PRN 5.  Encouraged use of heating pad 1-2x daily for now then PRN   Re order Lumbar X-ray, return next week for walk in X-ray  Future consider Ortho vs PT if indicated.  Meds ordered this encounter  Medications   cyclobenzaprine (FLEXERIL) 10 MG tablet    Sig: Take 1-2 tablets (10-20 mg total) by mouth at bedtime as needed for muscle spasms.    Dispense:  60 tablet    Refill:  2   predniSONE (DELTASONE) 10 MG tablet    Sig: Take 6 tabs with breakfast Day 1, 5 tabs Day 2, 4 tabs Day 3, 3 tabs Day 4, 2 tabs Day 5, 1 tab Day 6.    Dispense:  21 tablet    Refill:  0    Follow up plan: Return in about 4 months (around 04/05/2022) for 4 month fasting lab only then 1 week later Annual Physical.  Future labs ordered 03/2022  ANobie Putnam [facility] 12/03/2021, 3:37 PM

## 2021-12-03 NOTE — Patient Instructions (Addendum)
Thank you for coming to the office today.  X-ray walk in anytime Mon-Thurs 8am to 430pm. No apt needed  1. For your Back Pain - I think that this is due to Muscle Spasms or strain. Your Sciatic Nerve can be affected causing some of your radiation and numbness down your legs.  Take Flexeril 1-2 tab at bedtime as needed , re ordered  May use Tylenol Extra Str 500mg  tabs - may take 1-2 tablets every 6 hours as needed  Recommend to start using heating pad on your lower back 1-2x daily for few weeks  Also try a Wedge Seat Cushion to avoid nerve pinching when sitting prolonged period of time.  This pain may take weeks to months to fully resolve, but hopefully it will respond to the medicine initially. All back injuries (small or serious) are slow to heal since we use our back muscles every day. Be careful with turning, twisting, lifting, sitting / standing for prolonged periods, and avoid re-injury.  If your symptoms significantly worsen with more pain, or new symptoms with weakness in one or both legs, new or different shooting leg pains, numbness in legs or groin, loss of control or retention of urine or bowel movements, please call back for advice and you may need to go directly to the Emergency Department.  - Start Prednisone taper (steroid anti-inflammatory) for nerve irritation with pain in legs. Each pill is 10mg . Take 6 pills (60mg  daily) for 1 day at same time with breakfast, then each day reduce dose by 1 pill, so 5 pills, then 4, then 3, then 2 then 1 (last 6 days). Do not take any Ibuprofen or Aleve while taking the Prednisone.    DUE for FASTING BLOOD WORK (no food or drink after midnight before the lab appointment, only water or coffee without cream/sugar on the morning of)  SCHEDULE "Lab Only" visit in the morning at the clinic for lab draw in 4 MONTHS   - Make sure Lab Only appointment is at about 1 week before your next appointment, so that results will be available  For Lab  Results, once available within 2-3 days of blood draw, you can can log in to MyChart online to view your results and a brief explanation. Also, we can discuss results at next follow-up visit.   Please schedule a Follow-up Appointment to: Return in about 4 months (around 04/05/2022) for 4 month fasting lab only then 1 week later Annual Physical.  If you have any other questions or concerns, please feel free to call the office or send a message through MyChart. You may also schedule an earlier appointment if necessary.  Additionally, you may be receiving a survey about your experience at our office within a few days to 1 week by e-mail or mail. We value your feedback.  , DO [facility], 04/07/2022

## 2021-12-08 ENCOUNTER — Ambulatory Visit
Admission: RE | Admit: 2021-12-08 | Discharge: 2021-12-08 | Disposition: A | Discharge status: Home / Self Care | Payer: 59 | Source: Ambulatory Visit | Attending: Family Medicine | Admitting: Family Medicine

## 2021-12-08 ENCOUNTER — Ambulatory Visit
Admission: RE | Admit: 2021-12-08 | Discharge: 2021-12-08 | Disposition: A | Discharge status: Home / Self Care | Payer: 59 | Attending: Family Medicine | Admitting: Family Medicine

## 2021-12-08 DIAGNOSIS — M5442 Lumbago with sciatica, left side: Secondary | ICD-10-CM

## 2021-12-22 ENCOUNTER — Encounter: Payer: Self-pay | Admitting: Family Medicine

## 2021-12-22 ENCOUNTER — Telehealth (INDEPENDENT_AMBULATORY_CARE_PROVIDER_SITE_OTHER): Payer: 59 | Admitting: Family Medicine

## 2021-12-22 DIAGNOSIS — M5442 Lumbago with sciatica, left side: Secondary | ICD-10-CM | POA: Diagnosis not present

## 2021-12-22 MED ORDER — PREDNISONE 10 MG PO TABS: ORAL_TABLET | ORAL | 0 refills | Status: DC

## 2021-12-22 NOTE — Patient Instructions (Addendum)
Finish prednisone taper Use flexeril as needed Re order prednisone ONLY FOR FUTURE USE, do not use back to back if you can avoid it  DUE for FASTING BLOOD WORK (no food or drink after midnight before the lab appointment, only water or coffee without cream/sugar on the morning of)  SCHEDULE "Lab Only" visit in the morning at the clinic for lab draw in 3 MONTHS   - Make sure Lab Only appointment is at about 1 week before your next appointment, so that results will be available  For Lab Results, once available within 2-3 days of blood draw, you can can log in to MyChart online to view your results and a brief explanation. Also, we can discuss results at next follow-up visit.   Please schedule a Follow-up Appointment to: Return in about 3 months (around 03/24/2022) for 3 months fasting lab only then 1 week later Annual Physical.  If you have any other questions or concerns, please feel free to call the office or send a message through MyChart. You may also schedule an earlier appointment if necessary.  Additionally, you may be receiving a survey about your experience at our office within a few days to 1 week by e-mail or mail. We value your feedback.  Saralyn Pilar, DO [facility], [place]

## 2021-12-22 NOTE — Progress Notes (Signed)
Virtual Visit via Telephone The purpose of this virtual visit is to provide medical care while limiting exposure to the novel coronavirus (COVID19) for both patient and office staff.  Consent was obtained for phone visit:  Yes.   Answered questions that patient had about telehealth interaction:  Yes.   I discussed the limitations, risks, security and privacy concerns of performing an evaluation and management service by telephone. I also discussed with the patient that there may be a patient responsible charge related to this service. The patient expressed understanding and agreed to proceed.  Patient Location: Out, not at home. Provider Location: [facility] (Office)  Participants in virtual visit: - Patient: Tirrell "Melody Haver - CMA: Burnell Blanks, CMA - Provider: Dr Althea Charon  ---------------------------------------------------------------------- Chief Complaint  Patient presents with   Sciatica   Back Pain    S: Reviewed CMA documentation. I have called patient and gathered additional HPI as follows:  Sciatica, low back pain, Left Follow up, repeat flare   Last seen by me 12/03/21 for flare, improved on medication management.  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 He developed a significant pain in the back with flare up suddenly, similar to prior sciatica  This week acute flare, started 2 days ago on Monday with severe 8-10 out of 10 pain. He felt severe pain, took Prednisone taper that he had on hand for back up plan took 6 pills then down to 5 on Tuesday and today took 4 pills with the taper.  Significant relief so far. He can move better and has better mobility with bending and walking.  Pain dramatic improvement now down to 2 to 3 out of 10.  he says maybe with history of stroke and neuropathy, he thinks that he was compensating or  walking differently that triggered  Denies any fevers, chills, sweats, body ache, cough, shortness of breath, sinus pain or pressure, headache, abdominal pain, diarrhea  Past Medical History:  Diagnosis Date   Allergy    Hyperlipidemia    Hypertension 2003   Sleep apnea    Stroke (HCC) 12/2015   Right basal ganglia thought secondary to hypertension.   Social History   Tobacco Use   Smoking status: Never   Smokeless tobacco: Never  Substance Use Topics   Alcohol use: Yes    Alcohol/week: 2.0 standard drinks of alcohol    Types: 2 Cans of beer per week   Drug use: No    Current Outpatient Medications:    amLODipine (NORVASC) 5 MG tablet, TAKE 1 TABLET(5 MG) BY MOUTH DAILY, Disp: 90 tablet, Rfl: 3   atorvastatin (LIPITOR) 20 MG tablet, TAKE 1 TABLET(20 MG) BY MOUTH AT BEDTIME, Disp: 90 tablet, Rfl: 1   carvedilol (COREG) 25 MG tablet, TAKE 1 TABLET(25 MG) BY MOUTH TWICE DAILY WITH A MEAL, Disp: 180 tablet, Rfl: 0   cyclobenzaprine (FLEXERIL) 10 MG tablet, Take 1-2 tablets (10-20 mg total) by mouth at bedtime as needed for muscle spasms., Disp: 60 tablet, Rfl: 2   hydrALAZINE (APRESOLINE) 25 MG tablet, TAKE 3 TABLETS(75 MG) BY MOUTH THREE TIMES DAILY, Disp: 270 tablet, Rfl: 5   lisinopril (ZESTRIL) 20 MG tablet, TAKE 1 TABLET(20 MG) BY MOUTH DAILY, Disp: 90 tablet, Rfl: 0   loratadine (CLARITIN) 10 MG tablet, Take 10 mg by mouth daily., Disp: , Rfl:    predniSONE (DELTASONE) 10 MG tablet,  Take 6 tabs with breakfast Day 1, 5 tabs Day 2, 4 tabs Day 3, 3 tabs Day 4, 2 tabs Day 5, 1 tab Day 6., Disp: 21 tablet, Rfl: 0     04/08/2021    8:25 AM 10/06/2020    8:03 AM 04/14/2020    8:28 AM  Depression screen PHQ 2/9  Decreased Interest 0 0 0  Down, Depressed, Hopeless 0 0 0  PHQ - 2 Score 0 0 0  Altered sleeping 0 0   Tired, decreased energy 0 0   Change in appetite 0 0   Feeling bad or failure about yourself  0 0   Trouble concentrating 0 0   Moving slowly or fidgety/restless 0  0   Suicidal thoughts 0 0   PHQ-9 Score 0 0   Difficult doing work/chores Not difficult at all Not difficult at all        04/08/2021    8:26 AM 10/06/2020    8:03 AM  GAD 7 : Generalized Anxiety Score  Nervous, Anxious, on Edge 0 0  Control/stop worrying 0 0  Worry too much - different things 0 0  Trouble relaxing 0 0  Restless 0 0  Easily annoyed or irritable 0   Afraid - awful might happen 0 0  Total GAD 7 Score 0   Anxiety Difficulty Not difficult at all Not difficult at all    -------------------------------------------------------------------------- O: No physical exam performed due to remote telephone encounter.  Lab results reviewed.  I have personally reviewed the radiology report from 12/08/21 on Lumbar Spine.  DG Lumbar Spine Complete [[id number]] Resulted: 12/09/21 0947  Order Status: Completed Updated: 12/09/21 0949  Narrative:    CLINICAL DATA:  Chronic low back pain   EXAM:  LUMBAR SPINE - COMPLETE 4+ VIEW   COMPARISON:  Lumbar spine x-ray 06/22/2006   FINDINGS:  Lumbar vertebral body height are preserved without evidence of  fracture. Minimal grade 1 retrolisthesis of L2 on L3. Grade 1  anterolisthesis of L4 on L5. No spondylolysis visualized. Mild  intervertebral disc space narrowing and facet arthropathy from  L3-S1.   IMPRESSION:  Degenerative changes of the lumbar spine as described.    Electronically Signed    By: Jannifer Hick M.D.    On: 12/09/2021 09:47     No results found for this or any previous visit (from the past 2160 hour(s)).  -------------------------------------------------------------------------- A&P:  Problem List Items Addressed This Visit   None Visit Diagnoses     Acute left-sided low back pain with left-sided sciatica       Relevant Medications   predniSONE (DELTASONE) 10 MG tablet      Acute on chronic LBP with associated L sciatica. Suspect likely due to muscle spasm/strain, without known injury or  trauma. But he says may have had minor twisting injury for his back that triggered In setting of known chronic LBP with DJD Prior sciatica flares similar, last treated 12/03/21 - No red flag symptoms. Negative SLR for radiculopathy Lumbar Spine X-ray 12/08/21 see above  Plan: 1. Finish current Prednisone taper 2. Limit NSAID due to history GI intolerance 3. Continue Flexeril PRN caution sedation May use Tylenol PRN for breakthrough Encouraged use of heating pad 1-2x daily for now then PRN Future consider Orthopedic if indicated  Re order Prednisone as back up plan only again if recurrent flare develops can take this and follow up with me, discussed risks of long term or repeat prednisone use.   Meds ordered  this encounter  Medications   predniSONE (DELTASONE) 10 MG tablet    Sig: Take 6 tabs with breakfast Day 1, 5 tabs Day 2, 4 tabs Day 3, 3 tabs Day 4, 2 tabs Day 5, 1 tab Day 6.    Dispense:  21 tablet    Refill:  0    Follow-up: - Return in PRN for back, or 3 months for annual physical and labs  Patient verbalizes understanding with the above medical recommendations including the limitation of remote medical advice.  Specific follow-up and call-back criteria were given for patient to follow-up or seek medical care more urgently if needed.   - Time spent in direct consultation with patient on phone: 8 minutes   Nobie Putnam, [facility] 12/22/2021, 4:53 PM

## 2022-01-04 ENCOUNTER — Other Ambulatory Visit: Payer: Self-pay

## 2022-01-04 DIAGNOSIS — I129 Hypertensive chronic kidney disease with stage 1 through stage 4 chronic kidney disease, or unspecified chronic kidney disease: Secondary | ICD-10-CM

## 2022-01-04 MED ORDER — CARVEDILOL 25 MG PO TABS: ORAL_TABLET | ORAL | 0 refills | Status: DC

## 2022-01-04 MED ORDER — LISINOPRIL 20 MG PO TABS: ORAL_TABLET | ORAL | 1 refills | Status: DC

## 2022-01-24 ENCOUNTER — Other Ambulatory Visit: Payer: Self-pay | Admitting: Family Medicine

## 2022-01-24 DIAGNOSIS — I129 Hypertensive chronic kidney disease with stage 1 through stage 4 chronic kidney disease, or unspecified chronic kidney disease: Secondary | ICD-10-CM

## 2022-01-26 NOTE — Telephone Encounter (Signed)
Refilled 01/04/2022 #180 0 refills. Requested Prescriptions  Pending Prescriptions Disp Refills  . carvedilol (COREG) 25 MG tablet [Pharmacy Med Name: CARVEDILOL 25MG  TABLETS] 180 tablet 0    Sig: TAKE 1 TABLET(25 MG) BY MOUTH TWICE DAILY WITH A MEAL     Cardiovascular: Beta Blockers 3 Failed - 01/24/2022  7:00 AM      Failed - Cr in normal range and within 360 days    Creat  Date Value Ref Range Status  04/01/2021 1.43 (H) 0.70 - 1.30 mg/dL Final         Failed - Last BP in normal range    BP Readings from Last 1 Encounters:  12/03/21 (!) 144/79         Passed - AST in normal range and within 360 days    AST  Date Value Ref Range Status  04/01/2021 24 10 - 35 U/L Final         Passed - ALT in normal range and within 360 days    ALT  Date Value Ref Range Status  04/01/2021 24 9 - 46 U/L Final         Passed - Last Heart Rate in normal range    Pulse Readings from Last 1 Encounters:  12/03/21 65         Passed - Valid encounter within last 6 months    Recent Outpatient Visits          1 month ago Acute left-sided low back pain with left-sided sciatica   [facility], DO   1 month ago Acute left-sided low back pain with left-sided sciatica   [facility], DO   9 months ago Annual physical exam   [facility] [place], [place], [place]   1 year ago Acute left-sided low back pain with left-sided sciatica   [facility], DO   1 year ago Benign hypertension with CKD (chronic kidney disease) stage III [facility])   [facility] [place], [place], DO

## 2022-03-26 ENCOUNTER — Other Ambulatory Visit: Payer: Self-pay | Admitting: Family Medicine

## 2022-03-26 DIAGNOSIS — N183 Chronic kidney disease, stage 3 unspecified: Secondary | ICD-10-CM

## 2022-03-28 NOTE — Telephone Encounter (Signed)
Requested medication (s) are due for refill today:yes  Requested medication (s) are on the active medication list:yes  Last refill:  01/04/22  Future visit scheduled: yes  Notes to clinic:  [place]. DX Code Needed.      Requested Prescriptions  Pending Prescriptions Disp Refills   carvedilol (COREG) 25 MG tablet [Pharmacy Med Name: CARVEDILOL 25 MG TABLET] 180 tablet 1    Sig: TAKE 1 TABLET(25 MG) BY MOUTH TWICE DAILY WITH A MEAL     Cardiovascular: Beta Blockers 3 Failed - 03/26/2022  9:30 AM      Failed - Cr in normal range and within 360 days    Creat  Date Value Ref Range Status  04/01/2021 1.43 (H) 0.70 - 1.30 mg/dL Final         Failed - Last BP in normal range    BP Readings from Last 1 Encounters:  12/03/21 (!) 144/79         Passed - AST in normal range and within 360 days    AST  Date Value Ref Range Status  04/01/2021 24 10 - 35 U/L Final         Passed - ALT in normal range and within 360 days    ALT  Date Value Ref Range Status  04/01/2021 24 9 - 46 U/L Final         Passed - Last Heart Rate in normal range    Pulse Readings from Last 1 Encounters:  12/03/21 65         Passed - Valid encounter within last 6 months    Recent Outpatient Visits           3 months ago Acute left-sided low back pain with left-sided sciatica   [place], [place]   3 months ago Acute left-sided low back pain with left-sided sciatica   [facility] Olin Hauser, DO   11 months ago Annual physical exam   [place], DO   1 year ago Acute left-sided low back pain with left-sided sciatica   [place], DO   1 year ago Benign hypertension with CKD (chronic kidney disease) stage III [facility])   [facility] [place], Devonne Doughty, DO       Future Appointments             In 4 days  [place], Devonne Doughty, DO [facility], [facility]

## 2022-03-30 ENCOUNTER — Other Ambulatory Visit: Payer: Self-pay | Admitting: Family Medicine

## 2022-03-30 DIAGNOSIS — N183 Chronic kidney disease, stage 3 unspecified: Secondary | ICD-10-CM

## 2022-03-30 NOTE — Telephone Encounter (Signed)
Requested Prescriptions  Pending Prescriptions Disp Refills   amLODipine (NORVASC) 5 MG tablet [Pharmacy Med Name: AMLODIPINE BESYLATE 5 MG TAB] 30 tablet 2    Sig: TAKE 1 TABLET BY MOUTH ONCE DAILY     Cardiovascular: Calcium Channel Blockers 2 Failed - 03/30/2022  2:34 AM      Failed - Last BP in normal range    BP Readings from Last 1 Encounters:  12/03/21 (!) 144/79         Passed - Last Heart Rate in normal range    Pulse Readings from Last 1 Encounters:  12/03/21 65         Passed - Valid encounter within last 6 months    Recent Outpatient Visits           3 months ago Acute left-sided low back pain with left-sided sciatica   [facility] [place], Netta Neat, DO   3 months ago Acute left-sided low back pain with left-sided sciatica   [facility] Smitty Cords, DO   11 months ago Annual physical exam   [facility] [place], Netta Neat, DO   1 year ago Acute left-sided low back pain with left-sided sciatica   [facility] Smitty Cords, DO   1 year ago Benign hypertension with CKD (chronic kidney disease) stage III [facility])   [facility] Althea Charon, Netta Neat, DO       Future Appointments             In 2 days Althea Charon, Netta Neat, DO [facility], [facility]

## 2022-04-01 ENCOUNTER — Encounter: Payer: Self-pay | Admitting: Family Medicine

## 2022-04-01 ENCOUNTER — Ambulatory Visit (INDEPENDENT_AMBULATORY_CARE_PROVIDER_SITE_OTHER): Payer: 59 | Admitting: Family Medicine

## 2022-04-01 VITALS — BP 134/80 | HR 67 | Ht 75.0 in | Wt 294.0 lb

## 2022-04-01 DIAGNOSIS — E782 Mixed hyperlipidemia: Secondary | ICD-10-CM

## 2022-04-01 DIAGNOSIS — R7309 Other abnormal glucose: Secondary | ICD-10-CM

## 2022-04-01 DIAGNOSIS — Z23 Encounter for immunization: Secondary | ICD-10-CM

## 2022-04-01 DIAGNOSIS — Z0000 Encounter for general adult medical examination without abnormal findings: Secondary | ICD-10-CM | POA: Diagnosis not present

## 2022-04-01 DIAGNOSIS — N183 Chronic kidney disease, stage 3 unspecified: Secondary | ICD-10-CM

## 2022-04-01 DIAGNOSIS — D649 Anemia, unspecified: Secondary | ICD-10-CM

## 2022-04-01 DIAGNOSIS — Z125 Encounter for screening for malignant neoplasm of prostate: Secondary | ICD-10-CM

## 2022-04-01 DIAGNOSIS — I6930 Unspecified sequelae of cerebral infarction: Secondary | ICD-10-CM

## 2022-04-01 DIAGNOSIS — I129 Hypertensive chronic kidney disease with stage 1 through stage 4 chronic kidney disease, or unspecified chronic kidney disease: Secondary | ICD-10-CM

## 2022-04-01 NOTE — Assessment & Plan Note (Signed)
History of CVA 2017 Records reviewed He is on Statin therapy Has some sensation deficit L side chronic problem, left lower extremity foot affects his gait and balance at times but still very functional - discussed today, seems improved on Gabapentin 300mg  nightly less numbness. 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

## 2022-04-01 NOTE — Progress Notes (Signed)
Subjective:    Patient ID: Dalton Mathis, male    DOB: 05-07-1966, 56 y.o.   MRN: [id number]  Dalton Mathis is a 56 y.o. male presenting on 04/01/2022 for Annual Exam   HPI  Here for Annual Physical and Lab Orders today  Update Acute Left Low Back Pain Sciatica - improved S/p Prednisone from last visit 12/22/21, did a repeat - Left sided he has neuropathy weakness if he is walking heavier on his Left foot, and thinks this is making causing disruption in his gait and it could impact his hip and low back. Interested in custom device or orthotic if available to help  Venous insufficiency - admits some swelling in lower ext episodic flares   History of CVA  History of CVA in 2017,  Reduced sensation of Left inner foot, and also residual numbness often in Left upper extremity - In past he was on trial of Lyrica, without significant improvement Continues Gabapentin 324m nightly, seems to have some increased numbness in Left lower extremity. - He completed PT regimen post-CVA. 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 He will need handicap placard to help him due to limited mobility. Trying to work on strengthening, and he has problem with abnormal terrain.  He has improved strength at gym upper lower ext exercises   CHRONIC HTN / CKD-III Prior history of labs CKD-III range Cr 1.5to 1.8 from prior PCP Last lab showed at Cr 1.43 - improved near baseline Home readings 120-140 Current Meds - Amlodipine 573mdaily, Carvedilol 2545mID, Hydralazine 27m61m 3 of 25mg58m) TID, Lisinopril 20mg 47my  Reports good compliance, did take meds today. Tolerating well, w/o complaints. Admits some lower extremity edema left lower leg - history of chronic recurrent swelling, he always has some numbness in lower ext, over past few days swelling did improve. He continues to  drink water. Was not wearing compression. Denies CP, dyspnea, HA, dizziness / lightheadedness   Lifestyle Previous A1c 5.3 Working more on lifestyle and stretching gym He is working on gut health with pre/probiotic Taking Metamucil powder for fiber   Hyperlipidemia Lab reviewed 03/2021 showed total 142 and LDL 84, near current goal Continues on Atorvastatin 20mg d54m Due for repeat lab     Health Maintenance:   PSA 0.41 negative 2022, due for repeat    Due for Flu Shot, will receive today    Shingrix #2 today (previously had #1 10/31/21)  UTD COVID Booster  Colonoscopy 12/20/16, Dr ByrnettBary Castillaive, no polyps, repeat 10 years.     04/08/2021    8:25 AM 10/06/2020    8:03 AM 04/14/2020    8:28 AM  Depression screen PHQ 2/9  Decreased Interest 0 0 0  Down, Depressed, Hopeless 0 0 0  PHQ - 2 Score 0 0 0  Altered sleeping 0 0   Tired, decreased energy 0 0   Change in appetite 0 0   Feeling bad or failure about yourself  0 0   Trouble concentrating 0 0   Moving slowly or fidgety/restless 0 0   Suicidal thoughts 0 0   PHQ-9 Score 0 0   Difficult doing work/chores Not difficult at all Not difficult at all     Past Medical History:  Diagnosis Date   Allergy    Hyperlipidemia    Hypertension 2003   Sleep apnea  Stroke [facility]) 12/2015   Right basal ganglia thought secondary to hypertension.   Past Surgical History:  Procedure Laterality Date   COLONOSCOPY WITH PROPOFOL N/A 12/20/2016   Procedure: COLONOSCOPY WITH PROPOFOL;  Surgeon: Robert Bellow, MD;  Location: [facility] ENDOSCOPY;  Service: Endoscopy;  Laterality: N/A;   Social History   Socioeconomic History   Marital status: Single    Spouse name: Not on file   Number of children: Not on file   Years of education: Not on file   Highest education level: Not on file  Occupational History   Not on file  Tobacco Use   Smoking status: Never   Smokeless tobacco: Never  Substance and Sexual Activity    Alcohol use: Yes    Alcohol/week: 2.0 standard drinks of alcohol    Types: 2 Cans of beer per week   Drug use: No   Sexual activity: Not on file  Other Topics Concern   Not on file  Social History Narrative   Not on file   Social Determinants of Health   Financial Resource Strain: Not on file  Food Insecurity: Not on file  Transportation Needs: Not on file  Physical Activity: Not on file  Stress: Not on file  Social Connections: Not on file  Intimate Partner Violence: Not on file   Family History  Problem Relation Age of Onset   Colon cancer Neg Hx    Current Outpatient Medications on File Prior to Visit  Medication Sig   amLODipine (NORVASC) 5 MG tablet TAKE 1 TABLET BY MOUTH ONCE DAILY   atorvastatin (LIPITOR) 20 MG tablet TAKE 1 TABLET(20 MG) BY MOUTH AT BEDTIME   carvedilol (COREG) 25 MG tablet TAKE 1 TABLET(25 MG) BY MOUTH TWICE DAILY WITH A MEAL   cyclobenzaprine (FLEXERIL) 10 MG tablet Take 1-2 tablets (10-20 mg total) by mouth at bedtime as needed for muscle spasms.   hydrALAZINE (APRESOLINE) 25 MG tablet TAKE 3 TABLETS(75 MG) BY MOUTH THREE TIMES DAILY   lisinopril (ZESTRIL) 20 MG tablet TAKE 1 TABLET(20 MG) BY MOUTH DAILY   loratadine (CLARITIN) 10 MG tablet Take 10 mg by mouth daily.   No current facility-administered medications on file prior to visit.    Review of Systems  Constitutional:  Negative for activity change, appetite change, chills, diaphoresis, fatigue and fever.  HENT:  Negative for congestion and hearing loss.   Eyes:  Negative for visual disturbance.  Respiratory:  Negative for cough, chest tightness, shortness of breath and wheezing.   Cardiovascular:  Negative for chest pain, palpitations and leg swelling.  Gastrointestinal:  Negative for abdominal pain, constipation, diarrhea, nausea and vomiting.  Genitourinary:  Negative for dysuria, frequency and hematuria.  Musculoskeletal:  Negative for arthralgias and neck pain.  Skin:  Negative for  rash.  Neurological:  Negative for dizziness, weakness, light-headedness, numbness and headaches.  Hematological:  Negative for adenopathy.  Psychiatric/Behavioral:  Negative for behavioral problems, dysphoric mood and sleep disturbance.    Per HPI unless specifically indicated above      Objective:    BP 134/80 (BP Location: Left Arm, Cuff Size: Normal)   Pulse 67   Ht _0  (1.905 m)   Wt 294 lb (133.4 kg)   SpO2 98%   BMI 36.75 kg/m   Wt Readings from Last 3 Encounters:  04/01/22 294 lb (133.4 kg)  12/22/21 294 lb (133.4 kg)  12/03/21 294 lb 6.4 oz (133.5 kg)    Physical Exam Vitals and nursing note reviewed.  Constitutional:      General: He is not in acute distress.    Appearance: He is well-developed. He is obese. He is not diaphoretic.     Comments: Well-appearing, comfortable, cooperative  HENT:     Head: Normocephalic and atraumatic.  Eyes:     General:        Right eye: No discharge.        Left eye: No discharge.     Conjunctiva/sclera: Conjunctivae normal.     Pupils: Pupils are equal, round, and reactive to light.  Neck:     Thyroid: No thyromegaly.     Vascular: No carotid bruit.  Cardiovascular:     Rate and Rhythm: Normal rate and regular rhythm.     Pulses: Normal pulses.     Heart sounds: Normal heart sounds. No murmur heard. Pulmonary:     Effort: Pulmonary effort is normal. No respiratory distress.     Breath sounds: Normal breath sounds. No wheezing or rales.  Abdominal:     General: Bowel sounds are normal. There is no distension.     Palpations: Abdomen is soft. There is no mass.     Tenderness: There is no abdominal tenderness.  Musculoskeletal:        General: No tenderness. Normal range of motion.     Cervical back: Normal range of motion and neck supple.     Right lower leg: Edema present.     Left lower leg: Edema (+1 pitting mild bilateral edema LE) present.     Comments: Upper / Lower Extremities: - Normal muscle tone, strength  bilateral upper extremities 5/5, lower extremities 5/5  Lymphadenopathy:     Cervical: No cervical adenopathy.  Skin:    General: Skin is warm and dry.     Findings: No erythema or rash.  Neurological:     Mental Status: He is alert and oriented to person, place, and time.     Comments: Distal sensation intact to light touch all extremities  Psychiatric:        Mood and Affect: Mood normal.        Behavior: Behavior normal.        Thought Content: Thought content normal.     Comments: Well groomed, good eye contact, normal speech and thoughts    Results for orders placed or performed in visit on 03/31/21  HIV Antibody (routine testing w rflx)  Result Value Ref Range   HIV 1&2 Ab, 4th Generation NON-REACTIVE NON-REACTIVE  Hepatitis C antibody  Result Value Ref Range   Hepatitis C Ab NON-REACTIVE NON-REACTIVE   SIGNAL TO CUT-OFF 0.07 <1.00  TSH  Result Value Ref Range   TSH 1.54 0.40 - 4.50 mIU/L  PSA  Result Value Ref Range   PSA 0.41 < OR = 4.00 ng/mL  Lipid panel  Result Value Ref Range   Cholesterol 142 <200 mg/dL   HDL 38 (L) > OR = 40 mg/dL   Triglycerides 107 <150 mg/dL   LDL Cholesterol (Calc) 84 mg/dL (calc)   Total CHOL/HDL Ratio 3.7 <5.0 (calc)   Non-HDL Cholesterol (Calc) 104 <130 mg/dL (calc)  COMPLETE METABOLIC PANEL WITH GFR  Result Value Ref Range   Glucose, Bld 98 65 - 99 mg/dL   BUN 11 7 - 25 mg/dL   Creat 1.43 (H) 0.70 - 1.30 mg/dL   eGFR 58 (L) > OR = 60 mL/min/1.60m   BUN/Creatinine Ratio 8 6 - 22 (calc)   Sodium 142 135 - 146 mmol/L  Potassium 3.8 3.5 - 5.3 mmol/L   Chloride 107 98 - 110 mmol/L   CO2 24 20 - 32 mmol/L   Calcium 8.7 8.6 - 10.3 mg/dL   Total Protein 7.2 6.1 - 8.1 g/dL   Albumin 4.0 3.6 - 5.1 g/dL   Globulin 3.2 1.9 - 3.7 g/dL (calc)   AG Ratio 1.3 1.0 - 2.5 (calc)   Total Bilirubin 0.9 0.2 - 1.2 mg/dL   Alkaline phosphatase (APISO) 76 35 - 144 U/L   AST 24 10 - 35 U/L   ALT 24 9 - 46 U/L  CBC with Differential/Platelet   Result Value Ref Range   WBC 4.9 3.8 - 10.8 Thousand/uL   RBC 4.44 4.20 - 5.80 Million/uL   Hemoglobin 12.4 (L) 13.2 - 17.1 g/dL   HCT 38.9 38.5 - 50.0 %   MCV 87.6 80.0 - 100.0 fL   MCH 27.9 27.0 - 33.0 pg   MCHC 31.9 (L) 32.0 - 36.0 g/dL   RDW 14.0 11.0 - 15.0 %   Platelets 268 140 - 400 Thousand/uL   MPV 10.7 7.5 - 12.5 fL   Neutro Abs 2,264 1,500 - 7,800 cells/uL   Lymphs Abs 1,651 850 - 3,900 cells/uL   Absolute Monocytes 608 200 - 950 cells/uL   Eosinophils Absolute 358 15 - 500 cells/uL   Basophils Absolute 20 0 - 200 cells/uL   Neutrophils Relative % 46.2 %   Total Lymphocyte 33.7 %   Monocytes Relative 12.4 %   Eosinophils Relative 7.3 %   Basophils Relative 0.4 %  Hemoglobin A1c  Result Value Ref Range   Hgb A1c MFr Bld 5.3 <5.7 % of total Hgb   Mean Plasma Glucose 105 mg/dL   eAG (mmol/L) 5.8 mmol/L      Assessment & Plan:   Problem List Items Addressed This Visit     Benign hypertension with CKD (chronic kidney disease) stage III (HCC)    Elevated initial BP, repeat manual reading improved - Home BP readings  Complication history CVA LLE Edema episodic   Plan:  1. Continue Amlodipine 73m 2. Continue current BP regimen Carvedilol 280mBID, Hydralazine 7598mx 3 of 39m57mb) TID, Lisinopril 20mg77mly 3. Encourage improved lifestyle - low sodium diet, regular exercise 4. Continue monitor BP outside office, bring readings to next visit, if persistently >140/90 or new symptoms notify office sooner      History of cerebrovascular accident (CVA) with residual deficit    History of CVA 2017 Records reviewed He is on Statin therapy Has some sensation deficit L side chronic problem, left lower extremity foot affects his gait and balance at times but still very functional - discussed today, seems improved on Gabapentin 300mg 46mtly less numbness. He is improving activity and muscle strength  Continues to have L Foot Drop  Encourage future consider work with  PT or other options, ankle/foot drop brace  Info on [employer] on AVS, he may be able to pursue custom options for his foot/leg      Mixed hyperlipidemia    Controlled cholesterol on statin and lifestyle Due for repeat lab  Plan: 1. Continue current meds - Atorvastatin 20mg d58m 2. Encourage improved lifestyle - low carb/cholesterol, reduce portion size, continue improving regular exercise Refill atorvastatin      Morbid obesity (HCC)   Normocytic anemia    Due for lab      Other Visit Diagnoses     Annual physical exam    -  Primary   Needs flu shot       Relevant Orders   Flu Vaccine QUAD 110moIM (Fluarix, Fluzone & Alfiuria Quad PF) (Completed)   Need for shingles vaccine       Relevant Orders   Varicella-zoster vaccine IM (Completed)   Screening for prostate cancer       Abnormal glucose           Updated Health Maintenance information Fasting labs ordered today - pending results. Encouraged improvement to lifestyle with diet and exercise Goal of weight loss  Shingles vaccine today, final dose  Flu Shot today  Future Cologuard consider in 2 years 2025, then repeat Colonoscopy 3 years after that.  [facility]/ [facility] for [place] [address]Ph ([phone] ([phone] [place] [address]Open until 5PM Phone: [phone]: [phone] Check with them see if they have suggestions on any kind of equipment / custom / device or shoe/boot modification for the foot drop / weakness on left side  If they need an order from me - as long as they tell me what exactly you need, I can order it. Or they can fax [place] order. Or I can talk to them.  No orders of the defined types were placed in this encounter.     Follow up plan: Return in about 6 months (around 09/30/2022) for 6 month Follow-up HTN CKD, L Leg weak/foot drop  updates.  ANobie Putnam [facility]ville[facility] 04/01/2022, 3:42 PM

## 2022-04-01 NOTE — Assessment & Plan Note (Signed)
Due for lab

## 2022-04-01 NOTE — Patient Instructions (Addendum)
Thank you for coming to the office today.  Shingles vaccine today, final dose  Flu Shot today  Future Cologuard consider in 2 years 2025, then repeat Colonoscopy 3 years after that.  [facility] [address] [place] [address] [place], [place] [address] Ph [phone] Fax [phone]  [facility] [address] [place], [place] [address] Open until 5PM Phone: [phone] Fax: [phone]  Check with them see if they have suggestions on any kind of equipment / custom / device or shoe/boot modification for the foot drop / weakness on left side  If they need an order from me - as long as they tell me what exactly you need, I can order it. Or they can fax [place] an order. Or I can talk to them.  Please schedule a Follow-up Appointment to: Return in about 6 months (around 09/30/2022) for 6 month Follow-up HTN CKD, L Leg weak/foot drop updates.  If you have any other questions or concerns, please feel free to call the office or send a message through MyChart. You may also schedule an earlier appointment if necessary.  Additionally, you may be receiving a survey about your experience at our office within a few days to 1 week by e-mail or mail. We value your feedback.  Saralyn Pilar, DO [facility] [place], [place]

## 2022-04-01 NOTE — Assessment & Plan Note (Signed)
Elevated initial BP, repeat manual reading improved - Home BP readings  Complication history CVA LLE Edema episodic   Plan:  1. Continue Amlodipine 10mg  2. Continue current BP regimen Carvedilol 25mg  BID, Hydralazine 75mg  (x 3 of 25mg  tab) TID, Lisinopril 20mg  daily 3. Encourage improved lifestyle - low sodium diet, regular exercise 4. Continue monitor BP outside office, bring readings to next visit, if persistently >140/90 or new symptoms notify office sooner

## 2022-04-01 NOTE — Assessment & Plan Note (Signed)
Controlled cholesterol on statin and lifestyle Due for repeat lab  Plan: 1. Continue current meds - Atorvastatin 20mg  daily 2. Encourage improved lifestyle - low carb/cholesterol, reduce portion size, continue improving regular exercise Refill atorvastatin

## 2022-04-04 ENCOUNTER — Other Ambulatory Visit: Payer: 59

## 2022-04-04 DIAGNOSIS — I129 Hypertensive chronic kidney disease with stage 1 through stage 4 chronic kidney disease, or unspecified chronic kidney disease: Secondary | ICD-10-CM | POA: Diagnosis not present

## 2022-04-04 DIAGNOSIS — R7309 Other abnormal glucose: Secondary | ICD-10-CM | POA: Diagnosis not present

## 2022-04-04 DIAGNOSIS — D649 Anemia, unspecified: Secondary | ICD-10-CM | POA: Diagnosis not present

## 2022-04-04 DIAGNOSIS — E782 Mixed hyperlipidemia: Secondary | ICD-10-CM | POA: Diagnosis not present

## 2022-04-04 DIAGNOSIS — Z0000 Encounter for general adult medical examination without abnormal findings: Secondary | ICD-10-CM | POA: Diagnosis not present

## 2022-04-04 DIAGNOSIS — Z125 Encounter for screening for malignant neoplasm of prostate: Secondary | ICD-10-CM | POA: Diagnosis not present

## 2022-04-04 DIAGNOSIS — N183 Chronic kidney disease, stage 3 unspecified: Secondary | ICD-10-CM | POA: Diagnosis not present

## 2022-04-04 DIAGNOSIS — I6930 Unspecified sequelae of cerebral infarction: Secondary | ICD-10-CM | POA: Diagnosis not present

## 2022-04-05 LAB — CBC WITH DIFFERENTIAL/PLATELET
Absolute Monocytes: 729 cells/uL (ref 200–950)
Basophils Absolute: 22 cells/uL (ref 0–200)
Basophils Relative: 0.4 %
Eosinophils Absolute: 367 cells/uL (ref 15–500)
Eosinophils Relative: 6.8 %
HCT: 36.9 % — ABNORMAL LOW (ref 38.5–50.0)
Hemoglobin: 12.4 g/dL — ABNORMAL LOW (ref 13.2–17.1)
Lymphs Abs: 1744 cells/uL (ref 850–3900)
MCH: 28.2 pg (ref 27.0–33.0)
MCHC: 33.6 g/dL (ref 32.0–36.0)
MCV: 84.1 fL (ref 80.0–100.0)
MPV: 11.4 fL (ref 7.5–12.5)
Monocytes Relative: 13.5 %
Neutro Abs: 2538 cells/uL (ref 1500–7800)
Neutrophils Relative %: 47 %
Platelets: 256 10*3/uL (ref 140–400)
RBC: 4.39 10*6/uL (ref 4.20–5.80)
RDW: 13.8 % (ref 11.0–15.0)
Total Lymphocyte: 32.3 %
WBC: 5.4 10*3/uL (ref 3.8–10.8)

## 2022-04-05 LAB — COMPLETE METABOLIC PANEL WITH GFR
AG Ratio: 1.1 (calc) (ref 1.0–2.5)
ALT: 30 U/L (ref 9–46)
AST: 31 U/L (ref 10–35)
Albumin: 4.1 g/dL (ref 3.6–5.1)
Alkaline phosphatase (APISO): 78 U/L (ref 35–144)
BUN/Creatinine Ratio: 9 (calc) (ref 6–22)
BUN: 13 mg/dL (ref 7–25)
CO2: 25 mmol/L (ref 20–32)
Calcium: 9.1 mg/dL (ref 8.6–10.3)
Chloride: 108 mmol/L (ref 98–110)
Creat: 1.37 mg/dL — ABNORMAL HIGH (ref 0.70–1.30)
Globulin: 3.6 g/dL (calc) (ref 1.9–3.7)
Glucose, Bld: 84 mg/dL (ref 65–99)
Potassium: 3.6 mmol/L (ref 3.5–5.3)
Sodium: 144 mmol/L (ref 135–146)
Total Bilirubin: 0.9 mg/dL (ref 0.2–1.2)
Total Protein: 7.7 g/dL (ref 6.1–8.1)
eGFR: 61 mL/min/{1.73_m2} (ref >=60)

## 2022-04-05 LAB — LIPID PANEL
Cholesterol: 148 mg/dL (ref <200)
HDL: 40 mg/dL (ref >=40)
LDL Cholesterol (Calc): 91 mg/dL (calc)
Non-HDL Cholesterol (Calc): 108 mg/dL (calc) (ref <130)
Total CHOL/HDL Ratio: 3.7 (calc) (ref <5.0)
Triglycerides: 84 mg/dL (ref <150)

## 2022-04-05 LAB — HEMOGLOBIN A1C
Hgb A1c MFr Bld: 5.4 % of total Hgb (ref <5.7)
Mean Plasma Glucose: 108 mg/dL
eAG (mmol/L): 6 mmol/L

## 2022-04-05 LAB — PSA: PSA: 0.45 ng/mL (ref <=4.00)

## 2022-04-05 LAB — TSH: TSH: 2.21 mIU/L (ref 0.40–4.50)

## 2022-04-24 ENCOUNTER — Other Ambulatory Visit: Payer: Self-pay | Admitting: Family Medicine

## 2022-04-24 DIAGNOSIS — N183 Chronic kidney disease, stage 3 unspecified: Secondary | ICD-10-CM

## 2022-04-25 NOTE — Telephone Encounter (Signed)
Requested Prescriptions  Pending Prescriptions Disp Refills   lisinopril (ZESTRIL) 20 MG tablet [Pharmacy Med Name: LISINOPRIL 20MG  TABLETS] 90 tablet 1    Sig: TAKE 1 TABLET(20 MG) BY MOUTH DAILY     Cardiovascular:  ACE Inhibitors Failed - 04/24/2022  7:10 AM      Failed - Cr in normal range and within 180 days    Creat  Date Value Ref Range Status  04/04/2022 1.37 (H) 0.70 - 1.30 mg/dL Final         Passed - K in normal range and within 180 days    Potassium  Date Value Ref Range Status  04/04/2022 3.6 3.5 - 5.3 mmol/L Final         Passed - Patient is not pregnant      Passed - Last BP in normal range    BP Readings from Last 1 Encounters:  04/01/22 134/80         Passed - Valid encounter within last 6 months    Recent Outpatient Visits           3 weeks ago Annual physical exam   [facility], DO   4 months ago Acute left-sided low back pain with left-sided sciatica   [facility] [place], [place], DO   4 months ago Acute left-sided low back pain with left-sided sciatica   [facility], DO   1 year ago Annual physical exam   [facility], DO   1 year ago Acute left-sided low back pain with left-sided sciatica   [facility], DO       Future Appointments             In 5 months Smitty Cords, Althea Charon, DO [facility] [place], [facility]

## 2022-05-12 ENCOUNTER — Other Ambulatory Visit: Payer: Self-pay | Admitting: Family Medicine

## 2022-05-12 DIAGNOSIS — I129 Hypertensive chronic kidney disease with stage 1 through stage 4 chronic kidney disease, or unspecified chronic kidney disease: Secondary | ICD-10-CM

## 2022-05-12 NOTE — Telephone Encounter (Signed)
Unable to refill per protocol, request is too soon. Last refill 09/07/21 for 90 days and 5 refills. Will refuse.  Requested Prescriptions  Pending Prescriptions Disp Refills   hydrALAZINE (APRESOLINE) 25 MG tablet [Pharmacy Med Name: HYDRALAZINE 25 MG TABLET] 270 tablet 4    Sig: TAKE 3 TABS BY MOUTH THREE TIMES A DAY     Cardiovascular:  Vasodilators Failed - 05/12/2022  4:50 AM      Failed - HCT in normal range and within 360 days    HCT  Date Value Ref Range Status  04/04/2022 36.9 (L) 38.5 - 50.0 % Final         Failed - HGB in normal range and within 360 days    Hemoglobin  Date Value Ref Range Status  04/04/2022 12.4 (L) 13.2 - 17.1 g/dL Final         Failed - ANA Screen, Ifa, Serum in normal range and within 360 days    No results found for: "ANA", "ANATITER", "LABANTI"       Passed - RBC in normal range and within 360 days    RBC  Date Value Ref Range Status  04/04/2022 4.39 4.20 - 5.80 Million/uL Final         Passed - WBC in normal range and within 360 days    WBC  Date Value Ref Range Status  04/04/2022 5.4 3.8 - 10.8 Thousand/uL Final         Passed - PLT in normal range and within 360 days    Platelets  Date Value Ref Range Status  04/04/2022 256 140 - 400 Thousand/uL Final         Passed - Last BP in normal range    BP Readings from Last 1 Encounters:  04/01/22 134/80         Passed - Valid encounter within last 12 months    Recent Outpatient Visits           1 month ago Annual physical exam   [facility] [place], Netta Neat, DO   4 months ago Acute left-sided low back pain with left-sided sciatica   [facility] [place], Netta Neat, DO   5 months ago Acute left-sided low back pain with left-sided sciatica   [facility] Smitty Cords, DO   1 year ago Annual physical exam   [facility] Smitty Cords, DO   1 year ago Acute left-sided low back pain with  left-sided sciatica   [facility] Smitty Cords, DO       Future Appointments             In 4 months Althea Charon, Netta Neat, DO [facility], [facility]

## 2022-06-24 ENCOUNTER — Other Ambulatory Visit: Payer: Self-pay | Admitting: Family Medicine

## 2022-06-24 DIAGNOSIS — I129 Hypertensive chronic kidney disease with stage 1 through stage 4 chronic kidney disease, or unspecified chronic kidney disease: Secondary | ICD-10-CM

## 2022-06-24 NOTE — Telephone Encounter (Signed)
Requested Prescriptions  Pending Prescriptions Disp Refills   amLODipine (NORVASC) 5 MG tablet [Pharmacy Med Name: AMLODIPINE BESYLATE 5 MG TAB] 90 tablet 1    Sig: TAKE 1 TABLET BY MOUTH EVERY DAY     Cardiovascular: Calcium Channel Blockers 2 Passed - 06/24/2022  1:34 AM      Passed - Last BP in normal range    BP Readings from Last 1 Encounters:  04/01/22 134/80         Passed - Last Heart Rate in normal range    Pulse Readings from Last 1 Encounters:  04/01/22 67         Passed - Valid encounter within last 6 months    Recent Outpatient Visits           2 months ago Annual physical exam   [facility] [place], Devonne Doughty, DO   6 months ago Acute left-sided low back pain with left-sided sciatica   [place], DO   6 months ago Acute left-sided low back pain with left-sided sciatica   [place], DO   1 year ago Annual physical exam   [facility] O[place] Hauser, DO   1 year ago Acute left-sided low back pain with left-sided sciatica   [facility] [place], Devonne Doughty, DO       Future Appointments             In 3 months [place], Devonne Doughty, DO [facility], [facility]

## 2022-06-26 ENCOUNTER — Other Ambulatory Visit: Payer: Self-pay | Admitting: Family Medicine

## 2022-06-26 DIAGNOSIS — N183 Chronic kidney disease, stage 3 unspecified: Secondary | ICD-10-CM

## 2022-06-27 NOTE — Telephone Encounter (Signed)
Unable to refill per protocol, Rx request is too soon. Last refill 09/07/21 for 90 and 5 refills.  Requested Prescriptions  Pending Prescriptions Disp Refills   hydrALAZINE (APRESOLINE) 25 MG tablet [Pharmacy Med Name: HYDRALAZINE 25 MG TABLET] 270 tablet 4    Sig: TAKE 3 TABS BY MOUTH THREE TIMES A DAY     Cardiovascular:  Vasodilators Failed - 06/26/2022  8:31 AM      Failed - HCT in normal range and within 360 days    HCT  Date Value Ref Range Status  04/04/2022 36.9 (L) 38.5 - 50.0 % Final         Failed - HGB in normal range and within 360 days    Hemoglobin  Date Value Ref Range Status  04/04/2022 12.4 (L) 13.2 - 17.1 g/dL Final         Failed - ANA Screen, Ifa, Serum in normal range and within 360 days    No results found for: "ANA", "ANATITER", "LABANTI"       Passed - RBC in normal range and within 360 days    RBC  Date Value Ref Range Status  04/04/2022 4.39 4.20 - 5.80 Million/uL Final         Passed - WBC in normal range and within 360 days    WBC  Date Value Ref Range Status  04/04/2022 5.4 3.8 - 10.8 Thousand/uL Final         Passed - PLT in normal range and within 360 days    Platelets  Date Value Ref Range Status  04/04/2022 256 140 - 400 Thousand/uL Final         Passed - Last BP in normal range    BP Readings from Last 1 Encounters:  04/01/22 134/80         Passed - Valid encounter within last 12 months    Recent Outpatient Visits           2 months ago Annual physical exam   [place], DO   6 months ago Acute left-sided low back pain with left-sided sciatica   [place], DO   6 months ago Acute left-sided low back pain with left-sided sciatica   [place], DO   1 year ago Annual physical exam   [facility] Olin Hauser, DO   1 year ago Acute  left-sided low back pain with left-sided sciatica   [facility] [place], Devonne Doughty, DO       Future Appointments             In 3 months [place], Devonne Doughty, DO [facility], [facility]

## 2022-07-05 ENCOUNTER — Other Ambulatory Visit: Payer: Self-pay | Admitting: Family Medicine

## 2022-07-05 DIAGNOSIS — N183 Chronic kidney disease, stage 3 unspecified: Secondary | ICD-10-CM

## 2022-07-06 ENCOUNTER — Other Ambulatory Visit: Payer: Self-pay | Admitting: Family Medicine

## 2022-07-06 DIAGNOSIS — I129 Hypertensive chronic kidney disease with stage 1 through stage 4 chronic kidney disease, or unspecified chronic kidney disease: Secondary | ICD-10-CM

## 2022-07-06 NOTE — Telephone Encounter (Signed)
Unable to refill per protocol, Rx request is too soon. Last refill 09/07/21 for 90 and 5 refills.  Requested Prescriptions  Pending Prescriptions Disp Refills   hydrALAZINE (APRESOLINE) 25 MG tablet [Pharmacy Med Name: HYDRALAZINE 25 MG TABLET] 270 tablet 4    Sig: TAKE 3 TABS BY MOUTH THREE TIMES A DAY     Cardiovascular:  Vasodilators Failed - 07/05/2022  6:25 PM      Failed - HCT in normal range and within 360 days    HCT  Date Value Ref Range Status  04/04/2022 36.9 (L) 38.5 - 50.0 % Final         Failed - HGB in normal range and within 360 days    Hemoglobin  Date Value Ref Range Status  04/04/2022 12.4 (L) 13.2 - 17.1 g/dL Final         Failed - ANA Screen, Ifa, Serum in normal range and within 360 days    No results found for: "ANA", "ANATITER", "LABANTI"       Passed - RBC in normal range and within 360 days    RBC  Date Value Ref Range Status  04/04/2022 4.39 4.20 - 5.80 Million/uL Final         Passed - WBC in normal range and within 360 days    WBC  Date Value Ref Range Status  04/04/2022 5.4 3.8 - 10.8 Thousand/uL Final         Passed - PLT in normal range and within 360 days    Platelets  Date Value Ref Range Status  04/04/2022 256 140 - 400 Thousand/uL Final         Passed - Last BP in normal range    BP Readings from Last 1 Encounters:  04/01/22 134/80         Passed - Valid encounter within last 12 months    Recent Outpatient Visits           3 months ago Annual physical exam   [place], DO   6 months ago Acute left-sided low back pain with left-sided sciatica   [place], DO   7 months ago Acute left-sided low back pain with left-sided sciatica   [place], DO   1 year ago Annual physical exam   [facility] Olin Hauser, DO   1 year ago  Acute left-sided low back pain with left-sided sciatica   [facility] Parks Ranger, Devonne Doughty, DO       Future Appointments             In 2 months Parks Ranger, Devonne Doughty, DO [facility], [facility]

## 2022-07-07 NOTE — Telephone Encounter (Signed)
Refilled.   He is taking 3 tablets 3 times a day = 270.  Giving 5 refills for a 6 month supply.  Requested Prescriptions  Pending Prescriptions Disp Refills   hydrALAZINE (APRESOLINE) 25 MG tablet [Pharmacy Med Name: HYDRALAZINE 25 MG TABLET] 270 tablet 5    Sig: TAKE 3 TABS BY MOUTH THREE TIMES A DAY     Cardiovascular:  Vasodilators Failed - 07/06/2022  7:13 PM      Failed - HCT in normal range and within 360 days    HCT  Date Value Ref Range Status  04/04/2022 36.9 (L) 38.5 - 50.0 % Final         Failed - HGB in normal range and within 360 days    Hemoglobin  Date Value Ref Range Status  04/04/2022 12.4 (L) 13.2 - 17.1 g/dL Final         Failed - ANA Screen, Ifa, Serum in normal range and within 360 days    No results found for: "ANA", "ANATITER", "LABANTI"       Passed - RBC in normal range and within 360 days    RBC  Date Value Ref Range Status  04/04/2022 4.39 4.20 - 5.80 Million/uL Final         Passed - WBC in normal range and within 360 days    WBC  Date Value Ref Range Status  04/04/2022 5.4 3.8 - 10.8 Thousand/uL Final         Passed - PLT in normal range and within 360 days    Platelets  Date Value Ref Range Status  04/04/2022 256 140 - 400 Thousand/uL Final         Passed - Last BP in normal range    BP Readings from Last 1 Encounters:  04/01/22 134/80         Passed - Valid encounter within last 12 months    Recent Outpatient Visits           3 months ago Annual physical exam   [place], DO   6 months ago Acute left-sided low back pain with left-sided sciatica   [place], Devonne Doughty, DO   7 months ago Acute left-sided low back pain with left-sided sciatica   [place], DO   1 year ago Annual physical exam   [place], DO   1 year ago Acute  left-sided low back pain with left-sided sciatica   [place], DO       Future Appointments             In 2 months Parks Ranger, Devonne Doughty, DO [facility], [facility]             lisinopril (ZESTRIL) 20 MG tablet [Pharmacy Med Name: LISINOPRIL 20 MG TABLET] 30 tablet 5    Sig: TAKE 1 TABLET BY MOUTH EVERY DAY     Cardiovascular:  ACE Inhibitors Failed - 07/06/2022  7:13 PM      Failed - Cr in normal range and within 180 days    Creat  Date Value Ref Range Status  04/04/2022 1.37 (H) 0.70 - 1.30 mg/dL Final         Passed - K in normal range and within 180 days    Potassium  Date Value  Ref Range Status  04/04/2022 3.6 3.5 - 5.3 mmol/L Final         Passed - Patient is not pregnant      Passed - Last BP in normal range    BP Readings from Last 1 Encounters:  04/01/22 134/80         Passed - Valid encounter within last 6 months    Recent Outpatient Visits           3 months ago Annual physical exam   [facility] [place], Devonne Doughty, DO   6 months ago Acute left-sided low back pain with left-sided sciatica   [place], DO   7 months ago Acute left-sided low back pain with left-sided sciatica   [place], DO   1 year ago Annual physical exam   [facility] Olin Hauser, DO   1 year ago Acute left-sided low back pain with left-sided sciatica   [facility] Olin Hauser, DO       Future Appointments             In 2 months Parks Ranger, Devonne Doughty, [facility], [facility]

## 2022-07-26 ENCOUNTER — Encounter: Payer: Self-pay | Admitting: Family Medicine

## 2022-07-26 ENCOUNTER — Telehealth (INDEPENDENT_AMBULATORY_CARE_PROVIDER_SITE_OTHER): Payer: 59 | Admitting: Family Medicine

## 2022-07-26 VITALS — Ht 75.0 in | Wt 294.0 lb

## 2022-07-26 DIAGNOSIS — I69398 Other sequelae of cerebral infarction: Secondary | ICD-10-CM | POA: Diagnosis not present

## 2022-07-26 DIAGNOSIS — M21372 Foot drop, left foot: Secondary | ICD-10-CM

## 2022-07-26 DIAGNOSIS — I6930 Unspecified sequelae of cerebral infarction: Secondary | ICD-10-CM

## 2022-07-26 NOTE — Progress Notes (Signed)
Subjective:    Patient ID: Dalton Mathis, male    DOB: [birth year], 57 y.o.   MRN: [id number]  Iaan Krabill is a 57 y.o. male presenting on 07/26/2022 for Foot Problem  Virtual / Telehealth Encounter - Video Visit via [place] The purpose of this virtual visit is to provide medical care while limiting exposure to the novel coronavirus (COVID19) for both patient and office staff.  Consent was obtained for remote visit:  Yes.   Answered questions that patient had about telehealth interaction:  Yes.   I discussed the limitations, risks, security and privacy concerns of performing an evaluation and management service by video/telephone. I also discussed with the patient that there may be a patient responsible charge related to this service. The patient expressed understanding and agreed to proceed.  Patient Location: Home Provider Location: [facility] (Office)  Participants in virtual visit: - Patient: Dalton Mathis - CMA: Orinda Kenner, CMA - Provider: Dr Parks Ranger   HPI  Follow-up Left Foot Drop History of CVA 2017, residual L sided weakness History of back pain w sciatica Reduced sensation of Left inner foot, and also residual numbness often in Left upper extremity He completed PT regimen post-CVA. He was transitioned to Sports Rehab PT after completed initial PATIENT  Past treatment w Lyrica w/o improvement Continues Gabapentin '300mg'$  nightly, seems to have some increased numbness in Left lower extremity.  Left sided he has neuropathy weakness if he is walking heavier on his Left foot, and thinks this is making causing disruption in his gait and it could impact his hip and low back.   He has some chronic gradual persistent Left foot drag/drop, he had adjusted to this to compensate. He will need handicap placard to help him due to limited mobility. Trying to work on strengthening, and he has problem with abnormal terrain.  He has purchased a  foot brace that uses velcro to raise the angle of his foot helping lifting it to aid the foot drop.  He still has same weakness, numbness  He spoke with former PT and they advised TENS/NMES  He has no current Neurologist.        04/08/2021    8:25 AM 10/06/2020    8:03 AM 04/14/2020    8:28 AM  Depression screen PHQ 2/9  Decreased Interest 0 0 0  Down, Depressed, Hopeless 0 0 0  PHQ - 2 Score 0 0 0  Altered sleeping 0 0   Tired, decreased energy 0 0   Change in appetite 0 0   Feeling bad or failure about yourself  0 0   Trouble concentrating 0 0   Moving slowly or fidgety/restless 0 0   Suicidal thoughts 0 0   PHQ-9 Score 0 0   Difficult doing work/chores Not difficult at all Not difficult at all     Social History   Tobacco Use   Smoking status: Never   Smokeless tobacco: Never  Substance Use Topics   Alcohol use: Yes    Alcohol/week: 2.0 standard drinks of alcohol    Types: 2 Cans of beer per week   Drug use: No    Review of Systems Per HPI unless specifically indicated above     Objective:    Ht '6\' 3"'$  (1.905 m)   Wt 294 lb (133.4 kg)   BMI 36.75 kg/m   Wt Readings from Last 3 Encounters:  07/26/22 294 lb (133.4 kg)  04/01/22 294 lb (133.4 kg)  12/22/21  294 lb (133.4 kg)    Physical Exam  Note examination was completely remotely via video observation objective data only  Gen - well-appearing, no acute distress or apparent pain, comfortable HEENT - eyes appear clear without discharge or redness Heart/Lungs - cannot examine virtually - observed no evidence of coughing or labored breathing. Abd - cannot examine virtually  Skin - face visible today- no rash Neuro - awake, alert, oriented Psych - not anxious appearing   Results for orders placed or performed in visit on 04/01/22  TSH  Result Value Ref Range   TSH 2.21 0.40 - 4.50 mIU/L  PSA  Result Value Ref Range   PSA 0.45 < OR = 4.00 ng/mL  Hemoglobin A1c  Result Value Ref Range   Hgb A1c  MFr Bld 5.4 <5.7 % of total Hgb   Mean Plasma Glucose 108 mg/dL   eAG (mmol/L) 6.0 mmol/L  Lipid panel  Result Value Ref Range   Cholesterol 148 <200 mg/dL   HDL 40 > OR = 40 mg/dL   Triglycerides 84 <150 mg/dL   LDL Cholesterol (Calc) 91 mg/dL (calc)   Total CHOL/HDL Ratio 3.7 <5.0 (calc)   Non-HDL Cholesterol (Calc) 108 <130 mg/dL (calc)  CBC with Differential/Platelet  Result Value Ref Range   WBC 5.4 3.8 - 10.8 Thousand/uL   RBC 4.39 4.20 - 5.80 Million/uL   Hemoglobin 12.4 (L) 13.2 - 17.1 g/dL   HCT 36.9 (L) 38.5 - 50.0 %   MCV 84.1 80.0 - 100.0 fL   MCH 28.2 27.0 - 33.0 pg   MCHC 33.6 32.0 - 36.0 g/dL   RDW 13.8 11.0 - 15.0 %   Platelets 256 140 - 400 Thousand/uL   MPV 11.4 7.5 - 12.5 fL   Neutro Abs 2,538 1,500 - 7,800 cells/uL   Lymphs Abs 1,744 850 - 3,900 cells/uL   Absolute Monocytes 729 200 - 950 cells/uL   Eosinophils Absolute 367 15 - 500 cells/uL   Basophils Absolute 22 0 - 200 cells/uL   Neutrophils Relative % 47 %   Total Lymphocyte 32.3 %   Monocytes Relative 13.5 %   Eosinophils Relative 6.8 %   Basophils Relative 0.4 %  COMPLETE METABOLIC PANEL WITH GFR  Result Value Ref Range   Glucose, Bld 84 65 - 99 mg/dL   BUN 13 7 - 25 mg/dL   Creat 1.37 (H) 0.70 - 1.30 mg/dL   eGFR 61 > OR = 60 mL/min/1.24m   BUN/Creatinine Ratio 9 6 - 22 (calc)   Sodium 144 135 - 146 mmol/L   Potassium 3.6 3.5 - 5.3 mmol/L   Chloride 108 98 - 110 mmol/L   CO2 25 20 - 32 mmol/L   Calcium 9.1 8.6 - 10.3 mg/dL   Total Protein 7.7 6.1 - 8.1 g/dL   Albumin 4.1 3.6 - 5.1 g/dL   Globulin 3.6 1.9 - 3.7 g/dL (calc)   AG Ratio 1.1 1.0 - 2.5 (calc)   Total Bilirubin 0.9 0.2 - 1.2 mg/dL   Alkaline phosphatase (APISO) 78 35 - 144 U/L   AST 31 10 - 35 U/L   ALT 30 9 - 46 U/L      Assessment & Plan:   Problem List Items Addressed This Visit     History of cerebrovascular accident (CVA) with residual deficit   Relevant Orders   Ambulatory referral to Physical Therapy    Ambulatory referral to Neurology   Other Visit Diagnoses     Acquired left foot drop    -  Primary   Relevant Orders   Ambulatory referral to Physical Therapy   Ambulatory referral to Neurology       No orders of the defined types were placed in this encounter.   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 He is interested in pursuing PT again and TENS unit / NMES massage therapy. He is interested in further evaluation of his foot drop from neuro perspective if can treat neuropathy or provide other answers.   Refer to [facility] w TENS / NMES therapy Refer to [facility] Neurology Foot Drop  Orders Placed This Encounter  Procedures   Ambulatory referral to Physical Therapy    Referral Priority:   Routine    Referral Type:   Physical Medicine    Referral Reason:   Specialty Services Required    Requested Specialty:   Physical Therapy    Number of Visits Requested:   1   Ambulatory referral to Neurology    Referral Priority:   Routine    Referral Type:   Consultation    Referral Reason:   Specialty Services Required    Requested Specialty:   Neurology    Number of Visits Requested:   1      Follow up plan: Return if symptoms worsen or fail to improve.  Patient verbalizes understanding with the above medical recommendations including the limitation of remote medical advice.  Specific follow-up and call-back criteria were given for patient to follow-up or seek medical care more urgently if needed.  Total duration of direct patient care provided via video conference: 12 minutes    Nobie Putnam, [facility] 07/26/2022, 4:16 PM

## 2022-07-26 NOTE — Patient Instructions (Addendum)
Referrals sent  [facility]   Address: [address], [place], [place] [address] Hours: 8AM-5PM Phone: [phone]  [facility] Physical Therapy  Please schedule a Follow-up Appointment to: Return if symptoms worsen or fail to improve.  If you have any other questions or concerns, please feel free to call the office or send a message through [place]. You may also schedule an earlier appointment if necessary.  Additionally, you may be receiving a survey about your experience at our office within a few days to 1 week by e-mail or mail. We value your feedback.  Nobie Putnam, DO [place]

## 2022-09-19 ENCOUNTER — Other Ambulatory Visit: Payer: Self-pay | Admitting: Family Medicine

## 2022-09-19 DIAGNOSIS — N183 Chronic kidney disease, stage 3 unspecified: Secondary | ICD-10-CM

## 2022-09-20 NOTE — Telephone Encounter (Signed)
Requested Prescriptions  Pending Prescriptions Disp Refills   carvedilol (COREG) 25 MG tablet [Pharmacy Med Name: CARVEDILOL 25 MG TABLET] 60 tablet 5    Sig: TAKE 1 TABLET(25 MG) BY MOUTH TWICE DAILY WITH A MEAL     Cardiovascular: Beta Blockers 3 Failed - 09/19/2022  1:37 AM      Failed - Cr in normal range and within 360 days    Creat  Date Value Ref Range Status  04/04/2022 1.37 (H) 0.70 - 1.30 mg/dL Final         Passed - AST in normal range and within 360 days    AST  Date Value Ref Range Status  04/04/2022 31 10 - 35 U/L Final         Passed - ALT in normal range and within 360 days    ALT  Date Value Ref Range Status  04/04/2022 30 9 - 46 U/L Final         Passed - Last BP in normal range    BP Readings from Last 1 Encounters:  04/01/22 134/80         Passed - Last Heart Rate in normal range    Pulse Readings from Last 1 Encounters:  04/01/22 67         Passed - Valid encounter within last 6 months    Recent Outpatient Visits           1 month ago Acquired left foot drop   [place] [facility] [place], Netta Neat, DO   5 months ago Annual physical exam   [place] [facility] Smitty Cords, DO   9 months ago Acute left-sided low back pain with left-sided sciatica   [place] [facility] Smitty Cords, DO   9 months ago Acute left-sided low back pain with left-sided sciatica   [place] [facility] Smitty Cords, DO   1 year ago Annual physical exam   [place] [facility] Smitty Cords, DO       Future Appointments             In 1 week Althea Charon, Netta Neat, DO  [facility], [facility]

## 2022-09-26 ENCOUNTER — Ambulatory Visit: Payer: BC Managed Care – PPO | Admitting: Neurology

## 2022-09-30 ENCOUNTER — Ambulatory Visit (INDEPENDENT_AMBULATORY_CARE_PROVIDER_SITE_OTHER): Payer: 59 | Admitting: Family Medicine

## 2022-09-30 ENCOUNTER — Encounter: Payer: Self-pay | Admitting: Family Medicine

## 2022-09-30 VITALS — BP 128/76 | HR 69 | Temp 96.9°F | Wt 292.0 lb

## 2022-09-30 DIAGNOSIS — Z23 Encounter for immunization: Secondary | ICD-10-CM | POA: Diagnosis not present

## 2022-09-30 DIAGNOSIS — I6930 Unspecified sequelae of cerebral infarction: Secondary | ICD-10-CM

## 2022-09-30 DIAGNOSIS — D649 Anemia, unspecified: Secondary | ICD-10-CM

## 2022-09-30 DIAGNOSIS — E782 Mixed hyperlipidemia: Secondary | ICD-10-CM

## 2022-09-30 DIAGNOSIS — M21372 Foot drop, left foot: Secondary | ICD-10-CM | POA: Diagnosis not present

## 2022-09-30 DIAGNOSIS — N183 Chronic kidney disease, stage 3 unspecified: Secondary | ICD-10-CM

## 2022-09-30 DIAGNOSIS — I129 Hypertensive chronic kidney disease with stage 1 through stage 4 chronic kidney disease, or unspecified chronic kidney disease: Secondary | ICD-10-CM | POA: Diagnosis not present

## 2022-09-30 MED ORDER — LISINOPRIL 20 MG PO TABS: 20.0000 mg | ORAL_TABLET | Freq: Every day | ORAL | 3 refills | Status: DC

## 2022-09-30 MED ORDER — CARVEDILOL 25 MG PO TABS
25.0000 mg | ORAL_TABLET | Freq: Two times a day (BID) | ORAL | 3 refills | Status: DC
Start: 2022-09-30 — End: 2023-04-12

## 2022-09-30 MED ORDER — ATORVASTATIN CALCIUM 20 MG PO TABS: 20.0000 mg | ORAL_TABLET | Freq: Every day | ORAL | 3 refills | Status: DC

## 2022-09-30 MED ORDER — AMLODIPINE BESYLATE 5 MG PO TABS: 5.0000 mg | ORAL_TABLET | Freq: Every day | ORAL | 3 refills | Status: DC

## 2022-09-30 MED ORDER — HYDRALAZINE HCL 25 MG PO TABS: 75.0000 mg | ORAL_TABLET | Freq: Three times a day (TID) | ORAL | 3 refills | Status: DC

## 2022-09-30 NOTE — Patient Instructions (Addendum)
Thank you for coming to the office today.  Tdap vaccine today, good for 10 years.  All medications renewed for 90 day to [facility] with refills for 1 year. - mail order setup.  DUE for FASTING BLOOD WORK (no food or drink after midnight before the lab appointment, only water or coffee without cream/sugar on the morning of)  SCHEDULE "Lab Only" visit in the morning at the clinic for lab draw in 6 MONTHS   - Make sure Lab Only appointment is at about 1 week before your next appointment, so that results will be available  For Lab Results, once available within 2-3 days of blood draw, you can can log in to MyChart online to view your results and a brief explanation. Also, we can discuss results at next follow-up visit.   Please schedule a Follow-up Appointment to: Return in about 6 months (around 04/02/2023) for 6 month fasting lab only then 1 week later Annual Physical.  If you have any other questions or concerns, please feel free to call the office or send a message through MyChart. You may also schedule an earlier appointment if necessary.  Additionally, you may be receiving a survey about your experience at our office within a few days to 1 week by e-mail or mail. We value your feedback.  Saralyn Pilar, DO [facility], [place]

## 2022-09-30 NOTE — Progress Notes (Unsigned)
Subjective:    Patient ID: Dalton Mathis, male    DOB: February 02, 1966, 57 y.o.   MRN: [id number]  Reginaldo Brinton is a 57 y.o. male presenting on 09/30/2022 for No chief complaint on file.   HPI  ***Neurology re-scheduled to 10/25/22 then followed by Physical Therapy ***Needs to switch from [facility] 30 day to [facility] [employer] 90 day ***Takes Flexeril occasionally, feels groggy *** if he is concentrating and paying attention he does not have as much problem with the foot drop with ambulation, but if not focusing he will have the issue more - Goal for TENS unit in future with PT  Update Acute Left Low Back Pain Sciatica - improved S/p Prednisone from last visit 12/22/21, did a repeat - Left sided he has neuropathy weakness if he is walking heavier on his Left foot, and thinks this is making causing disruption in his gait and it could impact his hip and low back. Interested in custom device or orthotic if available to help   Venous insufficiency - admits some swelling in lower ext episodic flares   History of CVA  History of CVA in 2017,  Reduced sensation of Left inner foot, and also residual numbness often in Left upper extremity - In past he was on trial of Lyrica, without significant improvement Continues Gabapentin 300mg  nightly, seems to have some increased numbness in Left lower extremity. - He completed PT regimen post-CVA. 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 He will need handicap placard to help him due to limited mobility. Trying to work on strengthening, and he has problem with abnormal terrain.   He has improved strength at gym upper lower ext exercises   CHRONIC HTN / CKD-III Prior history of labs CKD-III range Cr 1.5to 1.8 from prior PCP Last lab showed at Cr 1.43 - improved near baseline Home readings 120-140 Current Meds - Amlodipine 5mg   daily, Carvedilol 25mg  BID, Hydralazine 75mg  (x 3 of 25mg  tab) TID, Lisinopril 20mg  daily  Reports good compliance, did take meds today. Tolerating well, w/o complaints. Admits some lower extremity edema left lower leg - history of chronic recurrent swelling, he always has some numbness in lower ext, over past few days swelling did improve. He continues to drink water. Was not wearing compression. Denies CP, dyspnea, HA, dizziness / lightheadedness   Lifestyle Previous A1c 5.3 Working more on lifestyle and stretching gym He is working on gut health with pre/probiotic Taking Metamucil powder for fiber   Hyperlipidemia Lab reviewed 03/2021 showed total 142 and LDL 84, near current goal Continues on Atorvastatin 20mg  daily Due for repeat lab   Health Maintenance: ***     04/08/2021    8:25 AM 10/06/2020    8:03 AM 04/14/2020    8:28 AM  Depression screen PHQ 2/9  Decreased Interest 0 0 0  Down, Depressed, Hopeless 0 0 0  PHQ - 2 Score 0 0 0  Altered sleeping 0 0   Tired, decreased energy 0 0   Change in appetite 0 0   Feeling bad or failure about yourself  0 0   Trouble concentrating 0 0   Moving slowly or fidgety/restless 0 0   Suicidal thoughts 0 0   PHQ-9 Score 0 0   Difficult doing work/chores Not difficult at all Not difficult at all     Social History   Tobacco  Use   Smoking status: Never   Smokeless tobacco: Never  Substance Use Topics   Alcohol use: Yes    Alcohol/week: 2.0 standard drinks of alcohol    Types: 2 Cans of beer per week   Drug use: No    Review of Systems Per HPI unless specifically indicated above     Objective:    BP 128/76 (BP Location: Left Arm, Patient Position: Sitting, Cuff Size: Large)   Pulse 69   Temp (!) 96.9 F (36.1 C) (Temporal)   Wt 292 lb (132.5 kg)   SpO2 97%   BMI 36.50 kg/m   Wt Readings from Last 3 Encounters:  09/30/22 292 lb (132.5 kg)  07/26/22 294 lb (133.4 kg)  04/01/22 294 lb (133.4 kg)    Physical  Exam   Results for orders placed or performed in visit on 04/01/22  TSH  Result Value Ref Range   TSH 2.21 0.40 - 4.50 mIU/L  PSA  Result Value Ref Range   PSA 0.45 < OR = 4.00 ng/mL  Hemoglobin A1c  Result Value Ref Range   Hgb A1c MFr Bld 5.4 <5.7 % of total Hgb   Mean Plasma Glucose 108 mg/dL   eAG (mmol/L) 6.0 mmol/L  Lipid panel  Result Value Ref Range   Cholesterol 148 <200 mg/dL   HDL 40 > OR = 40 mg/dL   Triglycerides 84 <161 mg/dL   LDL Cholesterol (Calc) 91 mg/dL (calc)   Total CHOL/HDL Ratio 3.7 <5.0 (calc)   Non-HDL Cholesterol (Calc) 108 <130 mg/dL (calc)  CBC with Differential/Platelet  Result Value Ref Range   WBC 5.4 3.8 - 10.8 Thousand/uL   RBC 4.39 4.20 - 5.80 Million/uL   Hemoglobin 12.4 (L) 13.2 - 17.1 g/dL   HCT 09.6 (L) 04.5 - 40.9 %   MCV 84.1 80.0 - 100.0 fL   MCH 28.2 27.0 - 33.0 pg   MCHC 33.6 32.0 - 36.0 g/dL   RDW 81.1 91.4 - 78.2 %   Platelets 256 140 - 400 Thousand/uL   MPV 11.4 7.5 - 12.5 fL   Neutro Abs 2,538 1,500 - 7,800 cells/uL   Lymphs Abs 1,744 850 - 3,900 cells/uL   Absolute Monocytes 729 200 - 950 cells/uL   Eosinophils Absolute 367 15 - 500 cells/uL   Basophils Absolute 22 0 - 200 cells/uL   Neutrophils Relative % 47 %   Total Lymphocyte 32.3 %   Monocytes Relative 13.5 %   Eosinophils Relative 6.8 %   Basophils Relative 0.4 %  COMPLETE METABOLIC PANEL WITH GFR  Result Value Ref Range   Glucose, Bld 84 65 - 99 mg/dL   BUN 13 7 - 25 mg/dL   Creat 9.56 (H) 2.13 - 1.30 mg/dL   eGFR 61 > OR = 60 YQ/MVH/[id number]   BUN/Creatinine Ratio 9 6 - 22 (calc)   Sodium 144 135 - 146 mmol/L   Potassium 3.6 3.5 - 5.3 mmol/L   Chloride 108 98 - 110 mmol/L   CO2 25 20 - 32 mmol/L   Calcium 9.1 8.6 - 10.3 mg/dL   Total Protein 7.7 6.1 - 8.1 g/dL   Albumin 4.1 3.6 - 5.1 g/dL   Globulin 3.6 1.9 - 3.7 g/dL (calc)   AG Ratio 1.1 1.0 - 2.5 (calc)   Total Bilirubin 0.9 0.2 - 1.2 mg/dL   Alkaline phosphatase (APISO) 78 35 - 144 U/L   AST 31  10 - 35 U/L   ALT 30 9 - 46 U/L  Assessment & Plan:   Problem List Items Addressed This Visit   None   No orders of the defined types were placed in this encounter.     Follow up plan: No follow-ups on file.  Future labs ordered for ***  Saralyn Pilar, DO [facility] 09/30/2022, 4:12 PM

## 2022-10-01 ENCOUNTER — Encounter: Payer: Self-pay | Admitting: Family Medicine

## 2022-10-01 ENCOUNTER — Other Ambulatory Visit: Payer: Self-pay | Admitting: Family Medicine

## 2022-10-01 DIAGNOSIS — E782 Mixed hyperlipidemia: Secondary | ICD-10-CM

## 2022-10-01 DIAGNOSIS — I6930 Unspecified sequelae of cerebral infarction: Secondary | ICD-10-CM

## 2022-10-01 DIAGNOSIS — N183 Chronic kidney disease, stage 3 unspecified: Secondary | ICD-10-CM

## 2022-10-01 DIAGNOSIS — Z0000 Encounter for general adult medical examination without abnormal findings: Secondary | ICD-10-CM

## 2022-10-01 DIAGNOSIS — D649 Anemia, unspecified: Secondary | ICD-10-CM

## 2022-10-01 DIAGNOSIS — R7309 Other abnormal glucose: Secondary | ICD-10-CM

## 2022-10-01 DIAGNOSIS — Z125 Encounter for screening for malignant neoplasm of prostate: Secondary | ICD-10-CM

## 2022-10-01 NOTE — Assessment & Plan Note (Signed)
Controlled cholesterol on statin and lifestyle  Plan: 1. Continue current meds - Atorvastatin 20mg  daily 2. Encourage improved lifestyle - low carb/cholesterol, reduce portion size, continue improving regular exercise Refill atorvastatin

## 2022-10-01 NOTE — Assessment & Plan Note (Signed)
History of CVA 2017 Records reviewed He is on Statin therapy Has some sensation deficit L side chronic problem, left lower extremity foot affects his gait and balance at times but still very functional - discussed today, seems improved on Gabapentin 300mg  nightly less numbness. He is improving activity and muscle strength  Continues to have L Foot Drop  Encourage future consider work with PT or other options, ankle/foot drop brace  Awaiting Neurology apt, it was re-scheduled and has PT

## 2022-10-01 NOTE — Assessment & Plan Note (Signed)
BP controlled - Home BP readings  Complication history CVA LLE Edema episodic   Plan:  1. Continue Amlodipine 10mg  2. Continue current BP regimen Carvedilol 25mg  BID, Hydralazine 75mg  (x 3 of 25mg  tab) TID, Lisinopril 20mg  daily 3. Encourage improved lifestyle - low sodium diet, regular exercise 4. Continue monitor BP outside office, bring readings to next visit, if persistently >140/90 or new symptoms notify office sooner

## 2022-10-23 ENCOUNTER — Other Ambulatory Visit: Payer: Self-pay | Admitting: Family Medicine

## 2022-10-23 DIAGNOSIS — E782 Mixed hyperlipidemia: Secondary | ICD-10-CM

## 2022-10-24 NOTE — Telephone Encounter (Signed)
Pt requested change of pharmacy  Requested Prescriptions  Pending Prescriptions Disp Refills   atorvastatin (LIPITOR) 20 MG tablet [Pharmacy Med Name: ATORVASTATIN 20 MG TABLET] 90 tablet 1    Sig: TAKE 1 TABLET BY MOUTH AT BEDTIME     Cardiovascular:  Antilipid - Statins Failed - 10/23/2022  8:48 AM      Failed - Lipid Panel in normal range within the last 12 months    Cholesterol  Date Value Ref Range Status  04/04/2022 148 <200 mg/dL Final   LDL Cholesterol (Calc)  Date Value Ref Range Status  04/04/2022 91 mg/dL (calc) Final    Comment:    Reference range: <100 . Desirable range <100 mg/dL for primary prevention;   <70 mg/dL for patients with CHD or diabetic patients  with > or = 2 CHD risk factors. Marland Kitchen LDL-C is now calculated using the Martin-Hopkins  calculation, which is a validated novel method providing  better accuracy than the Friedewald equation in the  estimation of LDL-C.  Horald Pollen et al. Lenox Ahr. 1610;960(45): 2061-2068  (http://education.[facility].com/faq/FAQ164)    HDL  Date Value Ref Range Status  04/04/2022 40 > OR = 40 mg/dL Final   Triglycerides  Date Value Ref Range Status  04/04/2022 84 <150 mg/dL Final         Passed - Patient is not pregnant      Passed - Valid encounter within last 12 months    Recent Outpatient Visits           3 weeks ago Benign hypertension with CKD (chronic kidney disease) stage III [facility])   [place] [place] [facility] [place], Netta Neat, DO   3 months ago Acquired left foot drop   [place] [facility] [place], Netta Neat, DO   6 months ago Annual physical exam   [facility] Smitty Cords, DO   10 months ago Acute left-sided low back pain with left-sided sciatica   [place] [facility] Smitty Cords, DO   10 months ago Acute left-sided low back pain with left-sided sciatica   [place] [facility] Althea Charon, Netta Neat, DO       Future Appointments             In 5 months Althea Charon, Netta Neat, DO [facility], [facility]

## 2022-10-25 ENCOUNTER — Encounter: Payer: Self-pay | Admitting: Diagnostic Neuroimaging

## 2022-10-25 ENCOUNTER — Ambulatory Visit (INDEPENDENT_AMBULATORY_CARE_PROVIDER_SITE_OTHER): Payer: 59 | Admitting: Diagnostic Neuroimaging

## 2022-10-25 VITALS — BP 132/75 | HR 66 | Ht 75.0 in | Wt 289.0 lb

## 2022-10-25 DIAGNOSIS — I610 Nontraumatic intracerebral hemorrhage in hemisphere, subcortical: Secondary | ICD-10-CM | POA: Diagnosis not present

## 2022-10-25 NOTE — Progress Notes (Signed)
[facility]  PATIENT: Dalton Mathis DOB: Mar 12, 1966  REFERRING CLINICIAN: Saralyn Pilar * HISTORY FROM: patient  REASON FOR VISIT: new consult   HISTORICAL  CHIEF COMPLAINT:  Chief Complaint  Patient presents with   New Patient (Initial Visit)    Rm 7, pt alone. 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 Sensation remains off on the left side and developed left foot drop as well. He has noticed some of the symptoms declined in last 2-3 years. Wears a foot brace. Is scheduled to start PT July.     HISTORY OF PRESENT ILLNESS:   57 year old male here for evaluation of poststroke left foot drop weakness.  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  He had resultant left-sided weakness and numbness.  He went through extensive therapy and recovery.  He has continued to have left foot drop weakness that is persistent over time.  He wears a left ankle foot brace.  He is planning to go to physical therapy for further evaluation and treatment options.  Minor numbness on the left side.  No pain.   REVIEW OF SYSTEMS: Full 14 system review of systems performed and negative with exception of: as per HPI.  ALLERGIES: No Known Allergies  HOME MEDICATIONS: Outpatient Medications Prior to Visit  Medication Sig Dispense Refill   amLODipine (NORVASC) 5 MG tablet Take 1 tablet (5 mg total) by mouth daily. 90 tablet 3   atorvastatin (LIPITOR) 20 MG tablet TAKE 1 TABLET BY MOUTH AT BEDTIME 90 tablet 1   carvedilol (COREG) 25 MG tablet Take 1 tablet (25 mg total) by mouth 2 (two) times daily with a meal. 180 tablet 3   cyclobenzaprine (FLEXERIL) 10 MG tablet Take 1-2 tablets (10-20 mg total) by mouth at bedtime as needed for muscle spasms. 60 tablet 2    hydrALAZINE (APRESOLINE) 25 MG tablet Take 3 tablets (75 mg total) by mouth 3 (three) times daily. 810 tablet 3   lisinopril (ZESTRIL) 20 MG tablet Take 1 tablet (20 mg total) by mouth daily. 90 tablet 3   Multiple Vitamin (MULTIVITAMIN ADULT PO) Take by mouth.     Probiotic Product (PROBIOTIC ADVANCED PO) Take by mouth.     psyllium (METAMUCIL) 58.6 % powder Take 1 packet by mouth.     loratadine (CLARITIN) 10 MG tablet Take 10 mg by mouth daily. (Patient not taking: Reported on 09/30/2022)     No facility-administered medications prior to visit.    PAST MEDICAL HISTORY: Past Medical History:  Diagnosis Date   Allergy    Hyperlipidemia    Hypertension 2003   Sleep apnea    Stroke (HCC) 12/2015   Right basal ganglia thought secondary to hypertension.    PAST SURGICAL HISTORY: Past Surgical History:  Procedure Laterality Date   COLONOSCOPY WITH PROPOFOL N/A 12/20/2016   Procedure: COLONOSCOPY WITH PROPOFOL;  Surgeon: Earline Mayotte, MD;  Location: [facility] ENDOSCOPY;  Service: Endoscopy;  Laterality: N/A;    FAMILY HISTORY: Family History  Problem Relation Age of Onset   Colon cancer Neg Hx     SOCIAL HISTORY: Social History   Socioeconomic History   Marital status: Single    Spouse name: Not on file   Number of children: Not on file   Years of education:  Not on file   Highest education level: Some college, no degree  Occupational History   Not on file  Tobacco Use   Smoking status: Never   Smokeless tobacco: Never  Substance and Sexual Activity   Alcohol use: Yes    Alcohol/week: 2.0 standard drinks of alcohol    Types: 2 Cans of beer per week   Drug use: No   Sexual activity: Not on file  Other Topics Concern   Not on file  Social History Narrative   Not on file   Social Determinants of Health   Financial Resource Strain: Low Risk  (09/26/2022)   Overall Financial Resource Strain (CARDIA)    Difficulty of Paying Living Expenses: Not hard at all  Food  Insecurity: No Food Insecurity (09/26/2022)   Hunger Vital Sign    Worried About Running Out of Food in the Last Year: Never true    Ran Out of Food in the Last Year: Never true  Transportation Needs: No Transportation Needs (09/26/2022)   PRAPARE - Administrator, Civil Service (Medical): No    Lack of Transportation (Non-Medical): No  Physical Activity: Sufficiently Active (09/26/2022)   Exercise Vital Sign    Days of Exercise per Week: 5 days    Minutes of Exercise per Session: 30 min  Stress: No Stress Concern Present (09/26/2022)   [employer] of Occupational Health - Occupational Stress Questionnaire    Feeling of Stress : Not at all  Social Connections: Unknown (09/26/2022)   Social Connection and Isolation Panel [NHANES]    Frequency of Communication with Friends and Family: Not on file    Frequency of Social Gatherings with Friends and Family: Once a week    Attends Religious Services: 1 to 4 times per year    Active Member of [employer] or Organizations: Yes    Attends Engineer, structural: More than 4 times per year    Marital Status: Never married  Intimate Partner Violence: Not on file     PHYSICAL EXAM  GENERAL EXAM/CONSTITUTIONAL: Vitals:  Vitals:   10/25/22 1550  BP: 132/75  Pulse: 66  Weight: 289 lb (131.1 kg)  Height: 6\' 3"  (1.905 m)   Body mass index is 36.12 kg/m. Wt Readings from Last 3 Encounters:  10/25/22 289 lb (131.1 kg)  09/30/22 292 lb (132.5 kg)  07/26/22 294 lb (133.4 kg)   Patient is in no distress; well developed, nourished and groomed; neck is supple  CARDIOVASCULAR: Examination of carotid arteries is normal; no carotid bruits Regular rate and rhythm, no murmurs Examination of peripheral vascular system by observation and palpation is normal  EYES: Ophthalmoscopic exam of optic discs and posterior segments is normal; no papilledema or hemorrhages No results found.  MUSCULOSKELETAL: Gait, strength, tone, movements  noted in Neurologic exam below  NEUROLOGIC: MENTAL STATUS:      No data to display         awake, alert, oriented to person, place and time recent and remote memory intact normal attention and concentration language fluent, comprehension intact, naming intact fund of knowledge appropriate  CRANIAL NERVE:  2nd - no papilledema on fundoscopic exam 2nd, 3rd, 4th, 6th - pupils equal and reactive to light, visual fields full to confrontation, extraocular muscles intact, no nystagmus 5th - facial sensation symmetric 7th - facial strength --> DECR LEFT NL FOLD 8th - hearing intact 9th - palate elevates symmetrically, uvula midline 11th - shoulder shrug symmetric 12th - tongue protrusion midline  MOTOR:  normal bulk and tone, full strength in the RUE, RLE LUE TRICEPS 4+; OTHERWISE 5 LLE 5 EXCEPT FOOT DF, INV, EVER LIMITED ROM 3-4/5  SENSORY:  normal and symmetric to light touch, pinprick, temperature, vibration  COORDINATION:  finger-nose-finger, fine finger movements SLIGHTLY SLOWER ON LEFT; RIGHT ARM ORBITS AROUND LEFT; LEFT FOOT TAPPING SLOWER  REFLEXES:  deep tendon reflexes 1+ and symmetric  GAIT/STATION:  narrow based gait; MILD LEFT FOOT DROP GAIT     DIAGNOSTIC DATA (LABS, IMAGING, TESTING) - I reviewed patient records, labs, notes, testing and imaging myself where available.  Lab Results  Component Value Date   WBC 5.4 04/04/2022   HGB 12.4 (L) 04/04/2022   HCT 36.9 (L) 04/04/2022   MCV 84.1 04/04/2022   PLT 256 04/04/2022      Component Value Date/Time   NA 144 04/04/2022 1535   NA 141 09/23/2019 0000   K 3.6 04/04/2022 1535   CL 108 04/04/2022 1535   CO2 25 04/04/2022 1535   GLUCOSE 84 04/04/2022 1535   BUN 13 04/04/2022 1535   BUN 11 09/23/2019 0000   CREATININE 1.37 (H) 04/04/2022 1535   CALCIUM 9.1 04/04/2022 1535   PROT 7.7 04/04/2022 1535   ALBUMIN 4.2 09/23/2019 0000   AST 31 04/04/2022 1535   ALT 30 04/04/2022 1535   ALKPHOS 82  09/23/2019 0000   BILITOT 0.9 04/04/2022 1535   GFRNONAA 53 (L) 10/06/2020 0825   GFRAA 62 10/06/2020 0825   Lab Results  Component Value Date   CHOL 148 04/04/2022   HDL 40 04/04/2022   LDLCALC 91 04/04/2022   TRIG 84 04/04/2022   CHOLHDL 3.7 04/04/2022   Lab Results  Component Value Date   HGBA1C 5.4 04/04/2022   No results found for: "VITAMINB12" Lab Results  Component Value Date   TSH 2.21 04/04/2022    12/28/15 CT head -Right sided intraparenchymal hemorrhage with effacement of the right lateral ventricle and approximately 4 mm of leftward midline shift   01/05/16 MRI brain - Right basal ganglia parenchymal hemorrhage is unchanged in comparison to most recent head CT.   01/05/16 Normal MRA.     ASSESSMENT AND PLAN  57 y.o. year old male here with:   Dx:  1. Basal ganglia hemorrhage (HCC)     PLAN:  RIGHT BASAL GANGLIA HEMORRHAGIC STROKE (Aug 2017; due to hypertension) - continue BP control, statin, CPAP for OSA - follow up with PT evaluation   Return for pending if symptoms worsen or fail to improve.    Suanne Marker, MD 10/25/2022, 4:12 PM Certified in Neurology, Neurophysiology and Neuroimaging  [facility] [address] [place], [place] [address] [phone]

## 2022-10-25 NOTE — Patient Instructions (Signed)
RIGHT BASAL GANGLIA HEMORRHAGIC STROKE (Aug 2017; due to hypertension) - continue BP control, statin - follow up with PT evaluation

## 2022-11-08 NOTE — Therapy (Unsigned)
OUTPATIENT PHYSICAL THERAPY NEURO EVALUATION   Patient Name: Dalton Mathis MRN: [id number] DOB:1966/02/09, 57 y.o., male Today's Date: 11/10/2022   PCP:   Smitty Cords, DO   REFERRING PROVIDER:   Smitty Cords, DO    END OF SESSION:  PT End of Session - 11/09/22 1736     Visit Number 1    Number of Visits 30    Date for PT Re-Evaluation 01/04/23    PT Start Time 1601    PT Stop Time 1649    PT Time Calculation (min) 48 min    Equipment Utilized During Treatment Gait belt    Activity Tolerance Patient tolerated treatment well    Behavior During Therapy [facility] for tasks assessed/performed             Past Medical History:  Diagnosis Date   Allergy    Hyperlipidemia    Hypertension 2003   Sleep apnea    Stroke (HCC) 12/2015   Right basal ganglia thought secondary to hypertension.   Past Surgical History:  Procedure Laterality Date   COLONOSCOPY WITH PROPOFOL N/A 12/20/2016   Procedure: COLONOSCOPY WITH PROPOFOL;  Surgeon: Earline Mayotte, MD;  Location: [facility] ENDOSCOPY;  Service: Endoscopy;  Laterality: N/A;   Patient Active Problem List   Diagnosis Date Noted   Normocytic anemia 04/14/2020   Peripheral neuropathy 04/14/2020   History of cerebrovascular accident (CVA) with residual deficit 03/03/2020   Mixed hyperlipidemia 03/03/2020   Benign hypertension with CKD (chronic kidney disease) stage III (HCC) 03/03/2020   Morbid obesity (HCC) 03/03/2020   Encounter for screening colonoscopy 11/15/2016    ONSET DATE: 2017  REFERRING DIAG: Left sided Foot drop, post basal ganglia hemorrhage in 2017  THERAPY DIAG:  Abnormality of gait and mobility  Muscle weakness (generalized)  Other abnormalities of gait and mobility  Foot drop, left  Rationale for Evaluation and Treatment: Rehabilitation  SUBJECTIVE:                                                                                                                                                                                              SUBJECTIVE STATEMENT: Pt has residual numbness on L side. He wears a brace on his L foot to assist with walking. 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 Pt also has a history of wrestling in free time, and stays relatively active regarding the aforementioned activities.  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 He had resultant left-sided weakness and numbness. He went through extensive therapy and recovery. He has continued to have left foot drop weakness that is persistent over time. He wears a left ankle foot brace.Marland KitchenMarland KitchenMinor numbness on the left side. No pain."  PAIN:  Are you having pain? No  PRECAUTIONS: None  WEIGHT BEARING RESTRICTIONS: No  FALLS: Has patient fallen in last 6 months? No  LIVING ENVIRONMENT: Lives with: lives with their family Lives in: House/apartment Stairs: Yes: Internal: 4 steps; on right going up, on left going up, and can reach both Has following equipment at home:  [facility]? Uses a L foot brace, uses a cane in new env for safely, PLOF: Independent    PATIENT GOALS: He wants to work on his L foot drop  OBJECTIVE:  (objective measures completed at initial evaluation unless otherwise dated)   LOWER EXTREMITY MMT:    MMT Right Eval Left Eval  Hip flexion 4+ 4+  Hip extension    Hip abduction 4+ 4+  Hip adduction 5 5  Hip internal rotation    Hip external rotation    Knee flexion 4 4-  Knee extension 5 4-  Ankle dorsiflexion 4+ 3  Ankle plantarflexion 4- 4-  Ankle inversion    Ankle eversion    (Blank rows = not  tested)   TRANSFERS: Assistive device utilized: None  Sit to stand: Complete Independence Stand to sit: Complete Independence Chair to chair: Complete Independence Floor:  Reports independence with floor transfers per wrestling activities requiring him to do so.   GAIT: Gait pattern:  Left Foot Drop, decreased ankle dorsiflexion- Left, and poor foot clearance- Left Distance walked: 100 ft Assistive device utilized: None Level of assistance: Complete Independence Comments: Above assessed during MiniBest test.  FUNCTIONAL TESTS:  5 times sit to stand: 18.14 sec Timed up and go (TUG): 13.15 sec MiniBest:14, see flowsheets for more detail.    [facility] PT Assessment - 11/10/22 0001       Mini-BESTest   Sit To Stand Normal: Comes to stand without use of hands and stabilizes independently.    Rise to Toes < 3 s.    Stand on one leg (left) Severe: Unable    Stand on one leg (right) Moderate: < 20 s    Stand on one leg - lowest score 0    Compensatory Stepping Correction - Forward Normal: Recovers independently with a single, large step (second realignement is allowed).    Compensatory Stepping Correction - Backward Moderate: More than one step is required to recover equilibrium    Compensatory Stepping Correction - Left Lateral Moderate: Several steps to recover equilibrium    Compensatory Stepping Correction - Right Lateral Normal: Recovers independently with 1 step (crossover or lateral OK)    Stepping Corredtion Lateral - lowest score 1    Stance - Feet together, eyes open, firm surface  Normal: 30s    Stance - Feet together, eyes closed, foam surface  Severe: Unable    Incline - Eyes Closed Severe: Unable    Change in Gait Speed Moderate: Unable to change walking speed or signs of imbalance    Walk with head turns - Horizontal Moderate: performs head turns with reduction in gait speed.    Walk with pivot turns Normal: Turns with feet close FAST (< 3 steps) with  good balance.     Step over obstacles Moderate: Steps over box but touches box OR displays cautious behavior by slowing gait.    Timed UP & GO with Dual Task Moderate: Dual Task affects either counting OR walking (>10%) when compared to the TUG without Dual Task.   TUG: 13.15, DT TUG: 19.92   Mini-BEST total score 14               PATIENT SURVEYS:  FOTO 49  TODAY'S TREATMENT:                                                                                                                              DATE: 11/09/22     PATIENT EDUCATION: Education details: Educated on Tests and measures, outcomes measures, POC Person educated: Patient Education method: Medical illustrator Education comprehension: verbalized understanding  HOME EXERCISE PROGRAM: Address session 2  GOALS: Goals reviewed with patient? No  SHORT TERM GOALS: Target date: 12/07/2022  Patient will be independent in home exercise program to improve strength/mobility for better functional independence with ADLs. Baseline: No HEP currently Goal status: INITIAL   LONG TERM GOALS: Target date: 01/04/2023   1.  Patient  will complete five times sit to stand test in < 15 seconds indicating an increased LE strength and improved balance. Baseline: 18.14 sec Goal status: INITIAL  2.  Patient will increase FOTO score to equal to or greater than  56  to demonstrate statistically significant improvement in mobility and quality of life.  Baseline: 49 Goal status: INITIAL   3.  Patient will increase MiniBest Test score by > 5 points to demonstrate decreased fall risk during functional activities. Baseline: 14 Goal status: INITIAL   4.   Patient will reduce timed up and go to <11 seconds to reduce fall risk and demonstrate improved transfer/gait ability. Baseline: 13.15 sec Goal status: INITIAL  5.   Patient will reduce Cognitive motor dual task timed up and go within 10% of patients normal TUG times to reduce fall risk and  demonstrate improved gait in busy environments Baseline: 19.92 sec  Goal status: INITIAL     ASSESSMENT:  CLINICAL IMPRESSION: Patient is a 57 y.o. Male who was seen today for physical therapy evaluation and treatment for left sided foot drop. Pt demonstrates balance impairments with mini best testing showing impaired anticipatory balance reactions, impaired ankle righting strategies, impaired sensory orientation to incline and to compliant surface and impaired dynamic gait. Pt also shows increased risk for falls and general balance impairments based on TUG test and imaired LE strength and power based on his 5X STS. Pt will benefit from various static and dynamic balance training activities, general endurance with longer spans of activity, and  General/isolated muscle strengthening to best benefit pt's occupational and personal requirements. Pt will benefit from skilled physical therapy intervention to address impairments, improve QOL, and attain therapy goals.     OBJECTIVE IMPAIRMENTS: Abnormal gait, decreased  balance, and difficulty walking.   ACTIVITY LIMITATIONS: standing, locomotion level, and Longer durations of ambulation.  PARTICIPATION LIMITATIONS: occupation  PERSONAL FACTORS: Past/current experiences and Time since onset of injury/illness/exacerbation are also affecting patient's functional outcome.   REHAB POTENTIAL: Good  CLINICAL DECISION MAKING: Stable/uncomplicated  EVALUATION COMPLEXITY: Low  PLAN:  PT FREQUENCY: 1-2x/week  PT DURATION: 8 weeks  PLANNED INTERVENTIONS: Therapeutic exercises, Therapeutic activity, Neuromuscular re-education, Balance training, Gait training, Patient/Family education, Self Care, Joint mobilization, Electrical stimulation, Cryotherapy, and Moist heat  PLAN FOR NEXT SESSION: Introduce and review HEP, Balance training, LE strengthening   Norman Herrlich, PT 11/10/2022, 11:18 AM

## 2022-11-09 ENCOUNTER — Ambulatory Visit: Payer: 59 | Attending: Family Medicine | Admitting: Physical Therapy

## 2022-11-09 DIAGNOSIS — I6930 Unspecified sequelae of cerebral infarction: Secondary | ICD-10-CM | POA: Diagnosis not present

## 2022-11-09 DIAGNOSIS — M6281 Muscle weakness (generalized): Secondary | ICD-10-CM

## 2022-11-09 DIAGNOSIS — M21372 Foot drop, left foot: Secondary | ICD-10-CM | POA: Diagnosis not present

## 2022-11-09 DIAGNOSIS — R269 Unspecified abnormalities of gait and mobility: Secondary | ICD-10-CM | POA: Diagnosis not present

## 2022-11-09 DIAGNOSIS — R2689 Other abnormalities of gait and mobility: Secondary | ICD-10-CM | POA: Diagnosis not present

## 2022-11-16 ENCOUNTER — Ambulatory Visit: Payer: 59

## 2022-11-16 DIAGNOSIS — M21372 Foot drop, left foot: Secondary | ICD-10-CM | POA: Diagnosis not present

## 2022-11-16 DIAGNOSIS — I6930 Unspecified sequelae of cerebral infarction: Secondary | ICD-10-CM | POA: Diagnosis not present

## 2022-11-16 DIAGNOSIS — R269 Unspecified abnormalities of gait and mobility: Secondary | ICD-10-CM

## 2022-11-16 DIAGNOSIS — R2689 Other abnormalities of gait and mobility: Secondary | ICD-10-CM

## 2022-11-16 DIAGNOSIS — M6281 Muscle weakness (generalized): Secondary | ICD-10-CM | POA: Diagnosis not present

## 2022-11-16 NOTE — Therapy (Signed)
OUTPATIENT PHYSICAL THERAPY NEURO TREATMENT   Patient Name: Dalton Mathis MRN: [id number] DOB:06-Jan-1966, 57 y.o., male Today's Date: 11/16/2022   PCP:   Smitty Cords, DO   REFERRING PROVIDER:   Smitty Cords, DO    END OF SESSION:  PT End of Session - 11/16/22 1548     Visit Number 2    Number of Visits 30    Date for PT Re-Evaluation 01/04/23    PT Start Time 1601    PT Stop Time 1645    PT Time Calculation (min) 44 min    Equipment Utilized During Treatment Gait belt    Activity Tolerance Patient tolerated treatment well    Behavior During Therapy WFL for tasks assessed/performed             Past Medical History:  Diagnosis Date   Allergy    Hyperlipidemia    Hypertension 2003   Sleep apnea    Stroke (HCC) 12/2015   Right basal ganglia thought secondary to hypertension.   Past Surgical History:  Procedure Laterality Date   COLONOSCOPY WITH PROPOFOL N/A 12/20/2016   Procedure: COLONOSCOPY WITH PROPOFOL;  Surgeon: Earline Mayotte, MD;  Location: [facility] ENDOSCOPY;  Service: Endoscopy;  Laterality: N/A;   Patient Active Problem List   Diagnosis Date Noted   Normocytic anemia 04/14/2020   Peripheral neuropathy 04/14/2020   History of cerebrovascular accident (CVA) with residual deficit 03/03/2020   Mixed hyperlipidemia 03/03/2020   Benign hypertension with CKD (chronic kidney disease) stage III (HCC) 03/03/2020   Morbid obesity (HCC) 03/03/2020   Encounter for screening colonoscopy 11/15/2016    ONSET DATE: 2017  REFERRING DIAG: Left sided Foot drop, post basal ganglia hemorrhage in 2017  THERAPY DIAG:  Abnormality of gait and mobility  Muscle weakness (generalized)  Other abnormalities of gait and mobility  Foot drop, left  Rationale for Evaluation and Treatment: Rehabilitation  SUBJECTIVE:                                                                                                                                                                                              SUBJECTIVE STATEMENT:  Pt reports he has been working all day and is ready to get PT over with and go home and rest.  Pt still wrestling and doing more training than anything.     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 He had resultant left-sided weakness and numbness. He went through extensive therapy and recovery. He has  continued to have left foot drop weakness that is persistent over time. He wears a left ankle foot brace.Marland KitchenMarland KitchenMinor numbness on the left side. No pain."  PAIN:  Are you having pain? No  PRECAUTIONS: None  WEIGHT BEARING RESTRICTIONS: No  FALLS: Has patient fallen in last 6 months? No  LIVING ENVIRONMENT: Lives with: lives with their family Lives in: House/apartment Stairs: Yes: Internal: 4 steps; on right going up, on left going up, and can reach both Has following equipment at home:  [facility]? Uses a L foot brace, uses a cane in new env for safely, PLOF: Independent    PATIENT GOALS: He wants to work on his L foot drop  OBJECTIVE:  (objective measures completed at initial evaluation unless otherwise dated)   LOWER EXTREMITY MMT:    MMT Right Eval Left Eval  Hip flexion 4+ 4+  Hip extension    Hip abduction 4+ 4+  Hip adduction 5 5  Hip internal rotation    Hip external rotation    Knee flexion 4 4-  Knee extension 5 4-  Ankle dorsiflexion 4+ 3  Ankle plantarflexion 4- 4-  Ankle inversion    Ankle eversion    (Blank rows = not tested)   TRANSFERS: Assistive device utilized: None  Sit to stand: Complete Independence Stand to sit: Complete Independence Chair to chair: Complete Independence Floor:  Reports independence with floor transfers per wrestling activities requiring him to do so.   GAIT: Gait pattern:  Left Foot  Drop, decreased ankle dorsiflexion- Left, and poor foot clearance- Left Distance walked: 100 ft Assistive device utilized: None Level of assistance: Complete Independence Comments: Above assessed during MiniBest test.  FUNCTIONAL TESTS:  5 times sit to stand: 18.14 sec Timed up and go (TUG): 13.15 sec MiniBest:14, see flowsheets for more detail.    [facility] PT Assessment - 11/10/22 0001       Mini-BESTest   Sit To Stand Normal: Comes to stand without use of hands and stabilizes independently.    Rise to Toes < 3 s.    Stand on one leg (left) Severe: Unable    Stand on one leg (right) Moderate: < 20 s    Stand on one leg - lowest score 0    Compensatory Stepping Correction - Forward Normal: Recovers independently with a single, large step (second realignement is allowed).    Compensatory Stepping Correction - Backward Moderate: More than one step is required to recover equilibrium    Compensatory Stepping Correction - Left Lateral Moderate: Several steps to recover equilibrium    Compensatory Stepping Correction - Right Lateral Normal: Recovers independently with 1 step (crossover or lateral OK)    Stepping Corredtion Lateral - lowest score 1    Stance - Feet together, eyes open, firm surface  Normal: 30s    Stance - Feet together, eyes closed, foam surface  Severe: Unable    Incline - Eyes Closed Severe: Unable    Change in Gait Speed Moderate: Unable to change walking speed or signs of imbalance    Walk with head turns - Horizontal Moderate: performs head turns with reduction in gait speed.    Walk with pivot turns Normal: Turns with feet close FAST (< 3 steps) with good balance.    Step over obstacles Moderate: Steps over box but touches box OR displays cautious behavior by slowing gait.    Timed UP & GO with Dual Task Moderate: Dual Task affects either counting OR walking (>10%) when compared to the  TUG without Dual Task.   TUG: 13.15, DT TUG: 19.92   Mini-BEST total score 14                PATIENT SURVEYS:  FOTO 49  TODAY'S TREATMENT: DATE: 11/09/22   TherEx:  STS x10, no UE usage STS with airex pad placed under feet for increased hip flexion and less stability under LE's, x5 Seated ankle dorsiflexion/plantarflexion on half foam roller for increased ankle mobility of the L LE, 2x15 Static stance on Airex pad, 30 sec bouts Static stance on Airex pad, eyes closed 30 sec bouts Static NBOS on Airex pad, 30 sec bouts Static NBOS on Airex pad, eyes closed, 30 sec bouts Static staggered stance on Airex pad, 30 sec bouts, each LE leading Static staggered stance on Airex pad, eyes closed, 30 sec bouts each LE leading  Blazepod Activity #1 Activity Description: Random tapping of lit pods while placed in semicircle in front of pt. Activity Setting:  Random Number of Pods:  5 Cycles/Sets:  2 Duration (Time or Hit Count):  20 hits  Blazepod Activity #2 Activity Description: R LE tapping Red light, L LE tapping Blue light Activity Setting:  Random Number of Pods:  5 Cycles/Sets:  2 Duration (Time or Hit Count):  30 hits   Blazepod Activity #3 Activity Description: Tapping only the blue lit pod while placed in semi circle in front of pt Activity Setting:  Focus Number of Pods:  6 Cycles/Sets:  1 Duration (Time or Hit Count):  30 hits  Blazepod Activity #4 Activity Description: Tapping only the blue lit pod while placed in semi circle on various surfaces (floor, slant board, yoga block, 4" step) in front of pt Activity Setting:  Focus Number of Pods:  6 Cycles/Sets:  1 Duration (Time or Hit Count):  30 hits   PATIENT EDUCATION: Education details: Educated on Tests and measures, outcomes measures, POC Person educated: Patient Education method: Medical illustrator Education comprehension: verbalized understanding  HOME EXERCISE PROGRAM: Address session 2  GOALS: Goals reviewed with patient? No  SHORT TERM GOALS: Target date:  12/07/2022  Patient will be independent in home exercise program to improve strength/mobility for better functional independence with ADLs. Baseline: No HEP currently Goal status: INITIAL   LONG TERM GOALS: Target date: 01/04/2023  1.  Patient  will complete five times sit to stand test in < 15 seconds indicating an increased LE strength and improved balance. Baseline: 18.14 sec Goal status: INITIAL  2.  Patient will increase FOTO score to equal to or greater than  56  to demonstrate statistically significant improvement in mobility and quality of life.  Baseline: 49 Goal status: INITIAL   3.  Patient will increase MiniBest Test score by > 5 points to demonstrate decreased fall risk during functional activities. Baseline: 14 Goal status: INITIAL   4.  Patient will reduce timed up and go to <11 seconds to reduce fall risk and demonstrate improved transfer/gait ability. Baseline: 13.15 sec Goal status: INITIAL  5.  Patient will reduce Cognitive motor dual task timed up and go within 10% of patients normal TUG times to reduce fall risk and demonstrate improved gait in busy environments Baseline: 19.92 sec  Goal status: INITIAL     ASSESSMENT:  CLINICAL IMPRESSION:  Pt put forth great effort throughout the session today and was engaged during the process.  Pt tends to have difficulty with proprioception due tot he lack of feeling in the L LE and is most  noticeable on static stance on slant board and when eyes are closed on airex foam.  Pt will continue to benefit from treatment approach to assist with this deficit, along with ankle strengthening exercises.  Pt seemed to enjoy blazepod activities and would likely benefit from more interactive and challenging methods of use during future sessions as well. HEP still needs to be generated and given to pt for improvement of current symptoms.   Pt will continue to benefit from skilled therapy to address remaining deficits in order to improve  overall QoL and return to PLOF.     OBJECTIVE IMPAIRMENTS: Abnormal gait, decreased balance, and difficulty walking.   ACTIVITY LIMITATIONS: standing, locomotion level, and Longer durations of ambulation.  PARTICIPATION LIMITATIONS: occupation  PERSONAL FACTORS: Past/current experiences and Time since onset of injury/illness/exacerbation are also affecting patient's functional outcome.   REHAB POTENTIAL: Good  CLINICAL DECISION MAKING: Stable/uncomplicated  EVALUATION COMPLEXITY: Low  PLAN:  PT FREQUENCY: 1-2x/week  PT DURATION: 8 weeks  PLANNED INTERVENTIONS: Therapeutic exercises, Therapeutic activity, Neuromuscular re-education, Balance training, Gait training, Patient/Family education, Self Care, Joint mobilization, Electrical stimulation, Cryotherapy, and Moist heat  PLAN FOR NEXT SESSION: Introduce and review HEP, Balance training, LE strengthening     Nolon Bussing, PT, DPT Physical Therapist - [facility]  [facility]  11/16/22, 6:26 PM

## 2022-11-21 ENCOUNTER — Encounter: Payer: Self-pay | Admitting: Physical Therapy

## 2022-11-21 ENCOUNTER — Ambulatory Visit: Payer: 59 | Attending: Family Medicine | Admitting: Physical Therapy

## 2022-11-21 DIAGNOSIS — R2689 Other abnormalities of gait and mobility: Secondary | ICD-10-CM | POA: Insufficient documentation

## 2022-11-21 DIAGNOSIS — M21372 Foot drop, left foot: Secondary | ICD-10-CM | POA: Diagnosis not present

## 2022-11-21 DIAGNOSIS — M6281 Muscle weakness (generalized): Secondary | ICD-10-CM | POA: Insufficient documentation

## 2022-11-21 DIAGNOSIS — R269 Unspecified abnormalities of gait and mobility: Secondary | ICD-10-CM | POA: Insufficient documentation

## 2022-11-21 NOTE — Therapy (Unsigned)
OUTPATIENT PHYSICAL THERAPY NEURO TREATMENT   Patient Name: Dalton Mathis MRN: [id number] DOB:07/29/1965, 57 y.o., male Today's Date: 11/21/2022   PCP:   Smitty Cords, DO   REFERRING PROVIDER:   Smitty Cords, DO    END OF SESSION:    Past Medical History:  Diagnosis Date   Allergy    Hyperlipidemia    Hypertension 2003   Sleep apnea    Stroke [facility]) 12/2015   Right basal ganglia thought secondary to hypertension.   Past Surgical History:  Procedure Laterality Date   COLONOSCOPY WITH PROPOFOL N/A 12/20/2016   Procedure: COLONOSCOPY WITH PROPOFOL;  Surgeon: Earline Mayotte, MD;  Location: [facility] ENDOSCOPY;  Service: Endoscopy;  Laterality: N/A;   Patient Active Problem List   Diagnosis Date Noted   Normocytic anemia 04/14/2020   Peripheral neuropathy 04/14/2020   History of cerebrovascular accident (CVA) with residual deficit 03/03/2020   Mixed hyperlipidemia 03/03/2020   Benign hypertension with CKD (chronic kidney disease) stage III (HCC) 03/03/2020   Morbid obesity (HCC) 03/03/2020   Encounter for screening colonoscopy 11/15/2016    ONSET DATE: 2017  REFERRING DIAG: Left sided Foot drop, post basal ganglia hemorrhage in 2017  THERAPY DIAG:  Abnormality of gait and mobility  Muscle weakness (generalized)  Other abnormalities of gait and mobility  Foot drop, left  Rationale for Evaluation and Treatment: Rehabilitation  SUBJECTIVE:                                                                                                                                                                                             SUBJECTIVE STATEMENT: Pt reports doing well, is not wearing is ankle brace today since he wasn't working. He notes no significant changes since last session.  Pt accompanied by: self  PERTINENT HISTORY: Following taken from Neurology Office visit note on 10/25/2022: "August 2017 patient had right basal ganglia  intracerebral hemorrhage due to hypertensive emergency. He had resultant left-sided weakness and numbness. He went through extensive therapy and recovery. He has continued to have left foot drop weakness that is persistent over time. He wears a left ankle foot brace.Marland KitchenMarland KitchenMinor numbness on the left side. No pain."  PAIN:  Are you having pain? No  PRECAUTIONS: None  WEIGHT BEARING RESTRICTIONS: No  FALLS: Has patient fallen in last 6 months? No  LIVING ENVIRONMENT: Lives with: lives with their family Lives in: House/apartment Stairs: Yes: Internal: 4 steps; on right going up, on left going up, and can reach both Has following equipment at home:  [facility]? Uses a L foot brace, uses a cane in new env for safely, PLOF:  Independent    PATIENT GOALS: He wants to work on his L foot drop  OBJECTIVE:  (objective measures completed at initial evaluation unless otherwise dated)   LOWER EXTREMITY MMT:    MMT Right Eval Left Eval  Hip flexion 4+ 4+  Hip extension    Hip abduction 4+ 4+  Hip adduction 5 5  Hip internal rotation    Hip external rotation    Knee flexion 4 4-  Knee extension 5 4-  Ankle dorsiflexion 4+ 3  Ankle plantarflexion 4- 4-  Ankle inversion    Ankle eversion    (Blank rows = not tested)   TRANSFERS: Assistive device utilized: None  Sit to stand: Complete Independence Stand to sit: Complete Independence Chair to chair: Complete Independence Floor:  Reports independence with floor transfers per wrestling activities requiring him to do so.   GAIT: Gait pattern:  Left Foot Drop, decreased ankle dorsiflexion- Left, and poor foot clearance- Left Distance walked: 100 ft Assistive device utilized: None Level of assistance: Complete Independence Comments: Above assessed during MiniBest test.  FUNCTIONAL TESTS:  5 times sit to stand: 18.14 sec Timed up and go (TUG): 13.15 sec MiniBest:14, see flowsheets for more detail.    [facility] PT Assessment - 11/10/22 0001        Mini-BESTest   Sit To Stand Normal: Comes to stand without use of hands and stabilizes independently.    Rise to Toes < 3 s.    Stand on one leg (left) Severe: Unable    Stand on one leg (right) Moderate: < 20 s    Stand on one leg - lowest score 0    Compensatory Stepping Correction - Forward Normal: Recovers independently with a single, large step (second realignement is allowed).    Compensatory Stepping Correction - Backward Moderate: More than one step is required to recover equilibrium    Compensatory Stepping Correction - Left Lateral Moderate: Several steps to recover equilibrium    Compensatory Stepping Correction - Right Lateral Normal: Recovers independently with 1 step (crossover or lateral OK)    Stepping Corredtion Lateral - lowest score 1    Stance - Feet together, eyes open, firm surface  Normal: 30s    Stance - Feet together, eyes closed, foam surface  Severe: Unable    Incline - Eyes Closed Severe: Unable    Change in Gait Speed Moderate: Unable to change walking speed or signs of imbalance    Walk with head turns - Horizontal Moderate: performs head turns with reduction in gait speed.    Walk with pivot turns Normal: Turns with feet close FAST (< 3 steps) with good balance.    Step over obstacles Moderate: Steps over box but touches box OR displays cautious behavior by slowing gait.    Timed UP & GO with Dual Task Moderate: Dual Task affects either counting OR walking (>10%) when compared to the TUG without Dual Task.   TUG: 13.15, DT TUG: 19.92   Mini-BEST total score 14               PATIENT SURVEYS:  FOTO 49  TODAY'S TREATMENT: DATE: 11/09/22  Introduced and educated pt on HEP consisting of the following exercises: - Resisted ankle dorsiflexion, inversion, and eversion. - Seated toe raises with heels on elevated surface  --see HEP for further detail.  NMR: Activity Description: foot taps standing on airex pad, 2 pods lateral sides of pad, 2 pods  on 1st step of stair, 1 pod on  2nd step of stair ~12 in height. Cue given to focus on flexing L foot into dorsiflexion instead of compensating with hip rotation. Activity Setting:  The Blaze Pod Random setting was chosen to enhance cognitive processing and agility, providing an unpredictable environment to simulate real-world scenarios, and fostering quick reactions and adaptability.  Number of Pods:  5 Cycles/Sets:  3 Duration (Time or Hit Count):  30 hits  On airex pad: Static standing x 30 sec Static NBOS x 45 sec Standing semi-tandem each foot forward x 45 sec Static NBOS, with eyes closed 2 x 45 sec, pt consistently drifting post. Needing help from SPT to correct efficiently. Stair taps, alt feet x 10 each side  Pt has tendency to drift posteriorly during balance exercises today, requiring min a from therapist to correct.  TherEx: Standing toe raises on incline wedge and UE Support Seated toe raises x 15 on each side with 3-4 second holds Completed single set of each exercise from HEP with accompanying reps per HEP section below.   PATIENT EDUCATION: Education details: Educated on Tests and measures, outcomes measures, POC Person educated: Patient Education method: Medical illustrator Education comprehension: verbalized understanding  HOME EXERCISE PROGRAM: Access Code: [email or web address] URL:  [email or web address] Date: 11/21/2022  Prepared by: Thresa Ross Exercises  -Ankle Dorsiflexion with Resistance - 1 x daily - 7 x weekly - 2 sets - 15 reps   -Long Sitting Ankle Eversion with Resistance - 1 x daily - 7 x weekly - 2 sets - 15 reps -  -Long Sitting Ankle Inversion with Anchored Resistance - 1 x daily - 7 x weekly - 2 sets - 15 reps  -Ankle dorsiflexion with heels on book and toes on floor to start - 1 x daily - 7 x weekly - 2 sets - 20 reps   GOALS: Goals reviewed with patient? Yes  SHORT TERM GOALS: Target date: 12/07/2022  Patient will be  independent in home exercise program to improve strength/mobility for better functional independence with ADLs. Baseline: No HEP currently Goal status: INITIAL   LONG TERM GOALS: Target date: 01/04/2023  1.  Patient  will complete five times sit to stand test in < 15 seconds indicating an increased LE strength and improved balance. Baseline: 18.14 sec Goal status: INITIAL  2.  Patient will increase FOTO score to equal to or greater than  56  to demonstrate statistically significant improvement in mobility and quality of life.  Baseline: 49 Goal status: INITIAL   3.  Patient will increase MiniBest Test score by > 5 points to demonstrate decreased fall risk during functional activities. Baseline: 14 Goal status: INITIAL   4.  Patient will reduce timed up and go to <11 seconds to reduce fall risk and demonstrate improved transfer/gait ability. Baseline: 13.15 sec Goal status: INITIAL  5.  Patient will reduce Cognitive motor dual task timed up and go within 10% of patients normal TUG times to reduce fall risk and demonstrate improved gait in busy environments Baseline: 19.92 sec  Goal status: INITIAL     ASSESSMENT:  CLINICAL IMPRESSION: Patient appeared motivated and ready for treatment on this day. Introduction of HEP given, pt educated on correct form and positioning. Pt reports feeling confident on education to perform successfully at home; plan to check back in next session. Continued variations of blaze pod activities to work on dynamic balance and SLS. Two instances of mild LOB seen today during balance exercises, pt is aware and able to grab on to balance  bar for safety but reaction is slowed to the point where SPT provided support. Pt will continue to benefit from skilled therapy to address remaining deficits in order to improve overall QoL and return to PLOF.     OBJECTIVE IMPAIRMENTS: Abnormal gait, decreased balance, and difficulty walking.   ACTIVITY LIMITATIONS:  standing, locomotion level, and Longer durations of ambulation.  PARTICIPATION LIMITATIONS: occupation  PERSONAL FACTORS: Past/current experiences and Time since onset of injury/illness/exacerbation are also affecting patient's functional outcome.   REHAB POTENTIAL: Good  CLINICAL DECISION MAKING: Stable/uncomplicated  EVALUATION COMPLEXITY: Low  PLAN:  PT FREQUENCY: 1-2x/week  PT DURATION: 8 weeks  PLANNED INTERVENTIONS: Therapeutic exercises, Therapeutic activity, Neuromuscular re-education, Balance training, Gait training, Patient/Family education, Self Care, Joint mobilization, Electrical stimulation, Cryotherapy, and Moist heat  PLAN FOR NEXT SESSION: Review HEP and adjust as needed, Dynamic Balance training, High level LE strengthening    Cecile Sheerer, SPT   11/21/22, 8:29 AM

## 2022-11-23 ENCOUNTER — Ambulatory Visit: Payer: 59 | Admitting: Physical Therapy

## 2022-11-28 ENCOUNTER — Ambulatory Visit: Payer: 59 | Admitting: Physical Therapy

## 2022-11-28 DIAGNOSIS — M21372 Foot drop, left foot: Secondary | ICD-10-CM

## 2022-11-28 DIAGNOSIS — R2689 Other abnormalities of gait and mobility: Secondary | ICD-10-CM

## 2022-11-28 DIAGNOSIS — R269 Unspecified abnormalities of gait and mobility: Secondary | ICD-10-CM | POA: Diagnosis not present

## 2022-11-28 DIAGNOSIS — M6281 Muscle weakness (generalized): Secondary | ICD-10-CM | POA: Diagnosis not present

## 2022-11-28 NOTE — Therapy (Signed)
OUTPATIENT PHYSICAL THERAPY NEURO TREATMENT   Patient Name: Dalton Mathis MRN: [id number] DOB:1965/05/28, 57 y.o., male Today's Date: 11/28/2022   PCP:   Smitty Cords, DO   REFERRING PROVIDER:   Smitty Cords, DO    END OF SESSION:  PT End of Session - 11/28/22 1621     Visit Number 4    Number of Visits 30    Date for PT Re-Evaluation 01/04/23    Progress Note Due on Visit 10    PT Start Time 1615    PT Stop Time 1700    PT Time Calculation (min) 45 min    Equipment Utilized During Treatment Gait belt    Activity Tolerance Patient tolerated treatment well    Behavior During Therapy [facility] for tasks assessed/performed              Past Medical History:  Diagnosis Date   Allergy    Hyperlipidemia    Hypertension 2003   Sleep apnea    Stroke (HCC) 12/2015   Right basal ganglia thought secondary to hypertension.   Past Surgical History:  Procedure Laterality Date   COLONOSCOPY WITH PROPOFOL N/A 12/20/2016   Procedure: COLONOSCOPY WITH PROPOFOL;  Surgeon: Earline Mayotte, MD;  Location: [facility] ENDOSCOPY;  Service: Endoscopy;  Laterality: N/A;   Patient Active Problem List   Diagnosis Date Noted   Normocytic anemia 04/14/2020   Peripheral neuropathy 04/14/2020   History of cerebrovascular accident (CVA) with residual deficit 03/03/2020   Mixed hyperlipidemia 03/03/2020   Benign hypertension with CKD (chronic kidney disease) stage III (HCC) 03/03/2020   Morbid obesity (HCC) 03/03/2020   Encounter for screening colonoscopy 11/15/2016    ONSET DATE: 2017  REFERRING DIAG: Left sided Foot drop, post basal ganglia hemorrhage in 2017  THERAPY DIAG:  No diagnosis found.  Rationale for Evaluation and Treatment: Rehabilitation  SUBJECTIVE:                                                                                                                                                                                             SUBJECTIVE  STATEMENT:  Pt reports some foot pain on Sunday in his L foot. Stated he took some Goody powder and it relieved it but was causing a limp at the time.   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 He had resultant left-sided weakness and numbness. He went through extensive therapy and recovery. He has continued to have left foot drop weakness that is persistent over  time. He wears a left ankle foot brace.Marland KitchenMarland KitchenMinor numbness on the left side. No pain."  PAIN:  Are you having pain? No  PRECAUTIONS: None  WEIGHT BEARING RESTRICTIONS: No  FALLS: Has patient fallen in last 6 months? No  LIVING ENVIRONMENT: Lives with: lives with their family Lives in: House/apartment Stairs: Yes: Internal: 4 steps; on right going up, on left going up, and can reach both Has following equipment at home:  [facility]? Uses a L foot brace, uses a cane in new env for safely, PLOF: Independent    PATIENT GOALS: He wants to work on his L foot drop  OBJECTIVE:  (objective measures completed at initial evaluation unless otherwise dated)   LOWER EXTREMITY MMT:    MMT Right Eval Left Eval  Hip flexion 4+ 4+  Hip extension    Hip abduction 4+ 4+  Hip adduction 5 5  Hip internal rotation    Hip external rotation    Knee flexion 4 4-  Knee extension 5 4-  Ankle dorsiflexion 4+ 3  Ankle plantarflexion 4- 4-  Ankle inversion    Ankle eversion    (Blank rows = not tested)   TRANSFERS: Assistive device utilized: None  Sit to stand: Complete Independence Stand to sit: Complete Independence Chair to chair: Complete Independence Floor:  Reports independence with floor transfers per wrestling activities requiring him to do so.   GAIT: Gait pattern:  Left Foot Drop, decreased ankle dorsiflexion- Left, and poor foot clearance- Left Distance  walked: 100 ft Assistive device utilized: None Level of assistance: Complete Independence Comments: Above assessed during MiniBest test.  FUNCTIONAL TESTS:  5 times sit to stand: 18.14 sec Timed up and go (TUG): 13.15 sec MiniBest:14, see flowsheets for more detail.    [facility] PT Assessment - 11/10/22 0001       Mini-BESTest   Sit To Stand Normal: Comes to stand without use of hands and stabilizes independently.    Rise to Toes < 3 s.    Stand on one leg (left) Severe: Unable    Stand on one leg (right) Moderate: < 20 s    Stand on one leg - lowest score 0    Compensatory Stepping Correction - Forward Normal: Recovers independently with a single, large step (second realignement is allowed).    Compensatory Stepping Correction - Backward Moderate: More than one step is required to recover equilibrium    Compensatory Stepping Correction - Left Lateral Moderate: Several steps to recover equilibrium    Compensatory Stepping Correction - Right Lateral Normal: Recovers independently with 1 step (crossover or lateral OK)    Stepping Corredtion Lateral - lowest score 1    Stance - Feet together, eyes open, firm surface  Normal: 30s    Stance - Feet together, eyes closed, foam surface  Severe: Unable    Incline - Eyes Closed Severe: Unable    Change in Gait Speed Moderate: Unable to change walking speed or signs of imbalance    Walk with head turns - Horizontal Moderate: performs head turns with reduction in gait speed.    Walk with pivot turns Normal: Turns with feet close FAST (< 3 steps) with good balance.    Step over obstacles Moderate: Steps over box but touches box OR displays cautious behavior by slowing gait.    Timed UP & GO with Dual Task Moderate: Dual Task affects either counting OR walking (>10%) when compared to the TUG without Dual Task.   TUG: 13.15, DT TUG: 19.92  Mini-BEST total score 14               PATIENT SURVEYS:  FOTO 49  TODAY'S TREATMENT: DATE:    Balance obstacle course   NMR: Balance Obstacle course- x 4 utilizing: red pt mat, airex pad, multiple 1/2 foam rollers, airex beam, green step-up, tilt/weight shift plank. Pt noting most difficulty with tilt/weight shift plank and airex beam   Weight shifts on tilt plank  1 x 10 each foot forwards, allowing full UE support for initial 5 reps. Pt still req intermittent UE support for final 5 reps. Fatiguing towards end of each step.  Balance holds 1 x 30 sec each side with one foot on 4 inch step, one foot on incline wedge. Additional 1 x 30 each foot with airex pad on 4 in step. Progressed in same position with vertical head turns 1 x 30 sec ea  Activity Description: blaze pod square, working on dynamic directional stepping. Balance activity on each line of square: 2 airex pads, tilt wedge, airex beam. Activity Setting:  The Blaze Pod Random setting was chosen to enhance cognitive processing and agility, providing an unpredictable environment to simulate real-world scenarios, and fostering quick reactions and adaptability.  Number of Pods:  4 Cycles/Sets:  3 sets Duration (Time or Hit Count): 1 min 15 sec active, 45 sec rest  TherEx: 2 x 15 heel raise on incline wedge  PATIENT EDUCATION: Education details: Educated on Tests and measures, outcomes measures, POC Person educated: Patient Education method: Medical illustrator Education comprehension: verbalized understanding  HOME EXERCISE PROGRAM: Access Code: [email or web address] URL:  [email or web address] Date: 11/21/2022  Prepared by: Thresa Ross Exercises  -Ankle Dorsiflexion with Resistance - 1 x daily - 7 x weekly - 2 sets - 15 reps   -Long Sitting Ankle Eversion with Resistance - 1 x daily - 7 x weekly - 2 sets - 15 reps -  -Long Sitting Ankle Inversion with Anchored Resistance - 1 x daily - 7 x weekly - 2 sets - 15 reps  -Ankle dorsiflexion with heels on book and toes on floor to start - 1 x daily - 7 x  weekly - 2 sets - 20 reps   GOALS: Goals reviewed with patient? Yes  SHORT TERM GOALS: Target date: 12/07/2022  Patient will be independent in home exercise program to improve strength/mobility for better functional independence with ADLs. Baseline: No HEP currently Goal status: INITIAL   LONG TERM GOALS: Target date: 01/04/2023  1.  Patient  will complete five times sit to stand test in < 15 seconds indicating an increased LE strength and improved balance. Baseline: 18.14 sec Goal status: INITIAL  2.  Patient will increase FOTO score to equal to or greater than  56  to demonstrate statistically significant improvement in mobility and quality of life.  Baseline: 49 Goal status: INITIAL   3.  Patient will increase MiniBest Test score by > 5 points to demonstrate decreased fall risk during functional activities. Baseline: 14 Goal status: INITIAL   4.  Patient will reduce timed up and go to <11 seconds to reduce fall risk and demonstrate improved transfer/gait ability. Baseline: 13.15 sec Goal status: INITIAL  5.  Patient will reduce Cognitive motor dual task timed up and go within 10% of patients normal TUG times to reduce fall risk and demonstrate improved gait in busy environments Baseline: 19.92 sec  Goal status: INITIAL     ASSESSMENT:  CLINICAL IMPRESSION:  Patient appeared motivated and ready for  treatment on this day. Today's session focused on dynamic high level balance activities. Notes increased difficulty with tilt plank, posterior weight shift specifically fatiguing with continued reps. L leg in posterior position is more of a challenge on inclines. Pt will continue to benefit from skilled therapy to address remaining deficits in order to improve overall QoL and return to PLOF.     OBJECTIVE IMPAIRMENTS: Abnormal gait, decreased balance, and difficulty walking.   ACTIVITY LIMITATIONS: standing, locomotion level, and Longer durations of  ambulation.  PARTICIPATION LIMITATIONS: occupation  PERSONAL FACTORS: Past/current experiences and Time since onset of injury/illness/exacerbation are also affecting patient's functional outcome.   REHAB POTENTIAL: Good  CLINICAL DECISION MAKING: Stable/uncomplicated  EVALUATION COMPLEXITY: Low  PLAN:  PT FREQUENCY: 1-2x/week  PT DURATION: 8 weeks  PLANNED INTERVENTIONS: Therapeutic exercises, Therapeutic activity, Neuromuscular re-education, Balance training, Gait training, Patient/Family education, Self Care, Joint mobilization, Electrical stimulation, Cryotherapy, and Moist heat  PLAN FOR NEXT SESSION: Review HEP and adjust as needed, Dynamic Balance training, High level LE strengthening   Norman Herrlich PT ,DPT Physical Therapist- [place]  [facility]

## 2022-11-30 ENCOUNTER — Ambulatory Visit: Payer: 59 | Admitting: Physical Therapy

## 2022-12-01 ENCOUNTER — Ambulatory Visit: Payer: 59 | Admitting: Physical Therapy

## 2022-12-01 DIAGNOSIS — R2689 Other abnormalities of gait and mobility: Secondary | ICD-10-CM | POA: Diagnosis not present

## 2022-12-01 DIAGNOSIS — R269 Unspecified abnormalities of gait and mobility: Secondary | ICD-10-CM | POA: Diagnosis not present

## 2022-12-01 DIAGNOSIS — M6281 Muscle weakness (generalized): Secondary | ICD-10-CM

## 2022-12-01 DIAGNOSIS — M21372 Foot drop, left foot: Secondary | ICD-10-CM | POA: Diagnosis not present

## 2022-12-01 NOTE — Therapy (Signed)
OUTPATIENT PHYSICAL THERAPY NEURO TREATMENT   Patient Name: Dalton Mathis MRN: [id number] DOB:08/04/1965, 57 y.o., male Today's Date: 12/02/2022   PCP:   Smitty Cords, DO   REFERRING PROVIDER:   Smitty Cords, DO    END OF SESSION:  PT End of Session - 12/01/22 1603     Visit Number 5    Number of Visits 30    Date for PT Re-Evaluation 01/04/23    Progress Note Due on Visit 10    PT Start Time 1615    PT Stop Time 1700    PT Time Calculation (min) 45 min    Equipment Utilized During Treatment Gait belt    Activity Tolerance Patient tolerated treatment well    Behavior During Therapy [facility] for tasks assessed/performed               Past Medical History:  Diagnosis Date   Allergy    Hyperlipidemia    Hypertension 2003   Sleep apnea    Stroke (HCC) 12/2015   Right basal ganglia thought secondary to hypertension.   Past Surgical History:  Procedure Laterality Date   COLONOSCOPY WITH PROPOFOL N/A 12/20/2016   Procedure: COLONOSCOPY WITH PROPOFOL;  Surgeon: Earline Mayotte, MD;  Location: [facility] ENDOSCOPY;  Service: Endoscopy;  Laterality: N/A;   Patient Active Problem List   Diagnosis Date Noted   Normocytic anemia 04/14/2020   Peripheral neuropathy 04/14/2020   History of cerebrovascular accident (CVA) with residual deficit 03/03/2020   Mixed hyperlipidemia 03/03/2020   Benign hypertension with CKD (chronic kidney disease) stage III (HCC) 03/03/2020   Morbid obesity (HCC) 03/03/2020   Encounter for screening colonoscopy 11/15/2016    ONSET DATE: 2017  REFERRING DIAG: Left sided Foot drop, post basal ganglia hemorrhage in 2017  THERAPY DIAG:  Abnormality of gait and mobility  Muscle weakness (generalized)  Other abnormalities of gait and mobility  Foot drop, left  Rationale for Evaluation and Treatment: Rehabilitation  SUBJECTIVE:                                                                                                                                                                                              SUBJECTIVE STATEMENT:  Pt reports no significant changes since last session, HEP has been going well.  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 He had resultant left-sided weakness and numbness. He went through extensive therapy and recovery. He has continued to have left foot drop weakness that is persistent  over time. He wears a left ankle foot brace.Marland KitchenMarland KitchenMinor numbness on the left side. No pain."  PAIN:  Are you having pain? No  PRECAUTIONS: None  WEIGHT BEARING RESTRICTIONS: No  FALLS: Has patient fallen in last 6 months? No  LIVING ENVIRONMENT: Lives with: lives with their family Lives in: House/apartment Stairs: Yes: Internal: 4 steps; on right going up, on left going up, and can reach both Has following equipment at home:  [facility]? Uses a L foot brace, uses a cane in new env for safely, PLOF: Independent    PATIENT GOALS: He wants to work on his L foot drop  OBJECTIVE:  (objective measures completed at initial evaluation unless otherwise dated)   LOWER EXTREMITY MMT:    MMT Right Eval Left Eval  Hip flexion 4+ 4+  Hip extension    Hip abduction 4+ 4+  Hip adduction 5 5  Hip internal rotation    Hip external rotation    Knee flexion 4 4-  Knee extension 5 4-  Ankle dorsiflexion 4+ 3  Ankle plantarflexion 4- 4-  Ankle inversion    Ankle eversion    (Blank rows = not tested)   TRANSFERS: Assistive device utilized: None  Sit to stand: Complete Independence Stand to sit: Complete Independence Chair to chair: Complete Independence Floor:  Reports independence with floor transfers per wrestling activities requiring him to do so.   GAIT: Gait pattern:  Left Foot Drop, decreased ankle dorsiflexion- Left,  and poor foot clearance- Left Distance walked: 100 ft Assistive device utilized: None Level of assistance: Complete Independence Comments: Above assessed during MiniBest test.  FUNCTIONAL TESTS:  5 times sit to stand: 18.14 sec Timed up and go (TUG): 13.15 sec MiniBest:14, see flowsheets for more detail.    [facility] PT Assessment - 11/10/22 0001       Mini-BESTest   Sit To Stand Normal: Comes to stand without use of hands and stabilizes independently.    Rise to Toes < 3 s.    Stand on one leg (left) Severe: Unable    Stand on one leg (right) Moderate: < 20 s    Stand on one leg - lowest score 0    Compensatory Stepping Correction - Forward Normal: Recovers independently with a single, large step (second realignement is allowed).    Compensatory Stepping Correction - Backward Moderate: More than one step is required to recover equilibrium    Compensatory Stepping Correction - Left Lateral Moderate: Several steps to recover equilibrium    Compensatory Stepping Correction - Right Lateral Normal: Recovers independently with 1 step (crossover or lateral OK)    Stepping Corredtion Lateral - lowest score 1    Stance - Feet together, eyes open, firm surface  Normal: 30s    Stance - Feet together, eyes closed, foam surface  Severe: Unable    Incline - Eyes Closed Severe: Unable    Change in Gait Speed Moderate: Unable to change walking speed or signs of imbalance    Walk with head turns - Horizontal Moderate: performs head turns with reduction in gait speed.    Walk with pivot turns Normal: Turns with feet close FAST (< 3 steps) with good balance.    Step over obstacles Moderate: Steps over box but touches box OR displays cautious behavior by slowing gait.    Timed UP & GO with Dual Task Moderate: Dual Task affects either counting OR walking (>10%) when compared to the TUG without Dual Task.   TUG: 13.15, DT TUG:  19.92   Mini-BEST total score 14               PATIENT SURVEYS:  FOTO  49  TODAY'S TREATMENT: DATE:   Unless otherwise stated, CGA was provided and gait belt donned in order to ensure pt safety  NMR:  On Airex pad:  Static stance x 30 sec NBOS x 30 sec EC 2 x 30 sec  Blaze Pod activity: Activity Description: foot tapping on variable surface compliance and height, changing set up each time. Activity Setting:  The [facility] Focus setting was selected to refine precision and concentration, isolating specific muscle groups or movements to enhance overall coordination and targeted muscle engagement. Number of Pods:  4 Cycles/Sets:  6 Duration (Time or Hit Count):  15 hits  Balance Obstacle course- x 2 utilizing: blue pt mat with weights under mat, airex pad, Airex beam, tilt/weight shift plank. Pt noting most difficulty with tilt/weight shift plankmtoday  Balance holds 2 x 30 sec each side with one foot on 4 inch step, one foot on incline wedge.   PATIENT EDUCATION: Education details: Educated on Tests and measures, outcomes measures, POC Person educated: Patient Education method: Medical illustrator Education comprehension: verbalized understanding  HOME EXERCISE PROGRAM: Access Code: [email or web address] URL:  [email or web address] Date: 11/21/2022  Prepared by: Thresa Ross Exercises  -Ankle Dorsiflexion with Resistance - 1 x daily - 7 x weekly - 2 sets - 15 reps   -Long Sitting Ankle Eversion with Resistance - 1 x daily - 7 x weekly - 2 sets - 15 reps -  -Long Sitting Ankle Inversion with Anchored Resistance - 1 x daily - 7 x weekly - 2 sets - 15 reps  -Ankle dorsiflexion with heels on book and toes on floor to start - 1 x daily - 7 x weekly - 2 sets - 20 reps   GOALS: Goals reviewed with patient? Yes  SHORT TERM GOALS: Target date: 12/07/2022  Patient will be independent in home exercise program to improve strength/mobility for better functional independence with ADLs. Baseline: No HEP currently Goal status:  INITIAL   LONG TERM GOALS: Target date: 01/04/2023  1.  Patient  will complete five times sit to stand test in < 15 seconds indicating an increased LE strength and improved balance. Baseline: 18.14 sec Goal status: INITIAL  2.  Patient will increase FOTO score to equal to or greater than  56  to demonstrate statistically significant improvement in mobility and quality of life.  Baseline: 49 Goal status: INITIAL   3.  Patient will increase MiniBest Test score by > 5 points to demonstrate decreased fall risk during functional activities. Baseline: 14 Goal status: INITIAL   4.  Patient will reduce timed up and go to <11 seconds to reduce fall risk and demonstrate improved transfer/gait ability. Baseline: 13.15 sec Goal status: INITIAL  5.  Patient will reduce Cognitive motor dual task timed up and go within 10% of patients normal TUG times to reduce fall risk and demonstrate improved gait in busy environments Baseline: 19.92 sec  Goal status: INITIAL     ASSESSMENT:  CLINICAL IMPRESSION:  Patient appeared motivated and ready for treatment on this day. Today's session focused on dynamic high level balance activities. Pt more fatigued today than typical due to back to back shifts. He notes significant challenge with toe tapping exercise, challenging with controlling the force for right foot tap. Continue to challenge variable foot tapping and obstacle course navigation. Pt will continue to benefit  from skilled physical therapy intervention to address impairments, improve QOL, and attain therapy goals.     OBJECTIVE IMPAIRMENTS: Abnormal gait, decreased balance, and difficulty walking.   ACTIVITY LIMITATIONS: standing, locomotion level, and Longer durations of ambulation.  PARTICIPATION LIMITATIONS: occupation  PERSONAL FACTORS: Past/current experiences and Time since onset of injury/illness/exacerbation are also affecting patient's functional outcome.   REHAB POTENTIAL:  Good  CLINICAL DECISION MAKING: Stable/uncomplicated  EVALUATION COMPLEXITY: Low  PLAN:  PT FREQUENCY: 1-2x/week  PT DURATION: 8 weeks  PLANNED INTERVENTIONS: Therapeutic exercises, Therapeutic activity, Neuromuscular re-education, Balance training, Gait training, Patient/Family education, Self Care, Joint mobilization, Electrical stimulation, Cryotherapy, and Moist heat  PLAN FOR NEXT SESSION: Review HEP and adjust as needed, Dynamic Balance training, High level LE strengthening    Cecile Sheerer, SPT  This entire session was performed under direct supervision and direction of a licensed Estate agent . I have personally read, edited and approve of the note as written.    This licensed clinician was present and actively directing care throughout the session at all times.  Norman Herrlich PT ,DPT Physical Therapist- [place]  [facility]

## 2022-12-02 ENCOUNTER — Encounter: Payer: Self-pay | Admitting: Physical Therapy

## 2022-12-05 ENCOUNTER — Ambulatory Visit: Payer: 59 | Admitting: Physical Therapy

## 2022-12-05 DIAGNOSIS — R2689 Other abnormalities of gait and mobility: Secondary | ICD-10-CM | POA: Diagnosis not present

## 2022-12-05 DIAGNOSIS — M6281 Muscle weakness (generalized): Secondary | ICD-10-CM | POA: Diagnosis not present

## 2022-12-05 DIAGNOSIS — R269 Unspecified abnormalities of gait and mobility: Secondary | ICD-10-CM

## 2022-12-05 DIAGNOSIS — M21372 Foot drop, left foot: Secondary | ICD-10-CM | POA: Diagnosis not present

## 2022-12-05 NOTE — Therapy (Signed)
OUTPATIENT PHYSICAL THERAPY NEURO TREATMENT   Patient Name: Dalton Mathis MRN: [id number] DOB:09-13-1965, 57 y.o., male Today's Date: 12/05/2022   PCP:   Smitty Cords, DO   REFERRING PROVIDER:   Smitty Cords, DO    END OF SESSION:  PT End of Session - 12/05/22 1605     Visit Number 6    Number of Visits 30    Date for PT Re-Evaluation 01/04/23    Progress Note Due on Visit 10    PT Start Time 1615    PT Stop Time 1700    PT Time Calculation (min) 45 min    Equipment Utilized During Treatment Gait belt    Activity Tolerance Patient tolerated treatment well    Behavior During Therapy [facility] for tasks assessed/performed                Past Medical History:  Diagnosis Date   Allergy    Hyperlipidemia    Hypertension 2003   Sleep apnea    Stroke (HCC) 12/2015   Right basal ganglia thought secondary to hypertension.   Past Surgical History:  Procedure Laterality Date   COLONOSCOPY WITH PROPOFOL N/A 12/20/2016   Procedure: COLONOSCOPY WITH PROPOFOL;  Surgeon: Earline Mayotte, MD;  Location: [facility] ENDOSCOPY;  Service: Endoscopy;  Laterality: N/A;   Patient Active Problem List   Diagnosis Date Noted   Normocytic anemia 04/14/2020   Peripheral neuropathy 04/14/2020   History of cerebrovascular accident (CVA) with residual deficit 03/03/2020   Mixed hyperlipidemia 03/03/2020   Benign hypertension with CKD (chronic kidney disease) stage III (HCC) 03/03/2020   Morbid obesity (HCC) 03/03/2020   Encounter for screening colonoscopy 11/15/2016    ONSET DATE: 2017  REFERRING DIAG: Left sided Foot drop, post basal ganglia hemorrhage in 2017  THERAPY DIAG:  Abnormality of gait and mobility  Other abnormalities of gait and mobility  Muscle weakness (generalized)  Foot drop, left  Rationale for Evaluation and Treatment: Rehabilitation  SUBJECTIVE:                                                                                                                                                                                              SUBJECTIVE STATEMENT:  Pt reports no significant changes since last session.  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 He had resultant left-sided weakness and numbness. He went through extensive therapy and recovery. He has continued to have left foot drop weakness that is persistent over time. He wears  a left ankle foot brace.Marland KitchenMarland KitchenMinor numbness on the left side. No pain." 7/15: Per discussion on pt's wresting related tasks: pt often has to enter/exit ring via hurdling through the rope around the ring, plan to incorporate interventions that work on high stepping/ hurdle activities  PAIN:  Are you having pain? No  PRECAUTIONS: None  WEIGHT BEARING RESTRICTIONS: No  FALLS: Has patient fallen in last 6 months? No  LIVING ENVIRONMENT: Lives with: lives with their family Lives in: House/apartment Stairs: Yes: Internal: 4 steps; on right going up, on left going up, and can reach both Has following equipment at home:  [facility]? Uses a L foot brace, uses a cane in new env for safely, PLOF: Independent    PATIENT GOALS: He wants to work on his L foot drop  OBJECTIVE:  (objective measures completed at initial evaluation unless otherwise dated)   LOWER EXTREMITY MMT:    MMT Right Eval Left Eval  Hip flexion 4+ 4+  Hip extension    Hip abduction 4+ 4+  Hip adduction 5 5  Hip internal rotation    Hip external rotation    Knee flexion 4 4-  Knee extension 5 4-  Ankle dorsiflexion 4+ 3  Ankle plantarflexion 4- 4-  Ankle inversion    Ankle eversion    (Blank rows = not tested)   TRANSFERS: Assistive device utilized: None  Sit to stand: Complete Independence Stand to sit: Complete Independence Chair to chair: Complete  Independence Floor:  Reports independence with floor transfers per wrestling activities requiring him to do so.   GAIT: Gait pattern:  Left Foot Drop, decreased ankle dorsiflexion- Left, and poor foot clearance- Left Distance walked: 100 ft Assistive device utilized: None Level of assistance: Complete Independence Comments: Above assessed during MiniBest test.  FUNCTIONAL TESTS:  5 times sit to stand: 18.14 sec Timed up and go (TUG): 13.15 sec MiniBest:14, see flowsheets for more detail.    [facility] PT Assessment - 11/10/22 0001       Mini-BESTest   Sit To Stand Normal: Comes to stand without use of hands and stabilizes independently.    Rise to Toes < 3 s.    Stand on one leg (left) Severe: Unable    Stand on one leg (right) Moderate: < 20 s    Stand on one leg - lowest score 0    Compensatory Stepping Correction - Forward Normal: Recovers independently with a single, large step (second realignement is allowed).    Compensatory Stepping Correction - Backward Moderate: More than one step is required to recover equilibrium    Compensatory Stepping Correction - Left Lateral Moderate: Several steps to recover equilibrium    Compensatory Stepping Correction - Right Lateral Normal: Recovers independently with 1 step (crossover or lateral OK)    Stepping Corredtion Lateral - lowest score 1    Stance - Feet together, eyes open, firm surface  Normal: 30s    Stance - Feet together, eyes closed, foam surface  Severe: Unable    Incline - Eyes Closed Severe: Unable    Change in Gait Speed Moderate: Unable to change walking speed or signs of imbalance    Walk with head turns - Horizontal Moderate: performs head turns with reduction in gait speed.    Walk with pivot turns Normal: Turns with feet close FAST (< 3 steps) with good balance.    Step over obstacles Moderate: Steps over box but touches box OR displays cautious behavior by slowing gait.    Timed  UP & GO with Dual Task Moderate: Dual Task  affects either counting OR walking (>10%) when compared to the TUG without Dual Task.   TUG: 13.15, DT TUG: 19.92   Mini-BEST total score 14               PATIENT SURVEYS:  FOTO 49  TODAY'S TREATMENT: DATE: 12/05/2022 Unless otherwise stated, CGA was provided and gait belt donned in order to ensure pt safety  NMR: On Airex pad:  Static stance x 30 sec NBOS x 30 sec EC 2 x 30 sec  Balance Obstacle course- x 2 utilizing: wedge as hurdle, step plank, 1/2 foam rollers, Airex pad  Hurdle stepping using wedge at balance bar x 10, working on proprioception of LE's and clearance.  Multi tapping on 4 inch step, able to tap x3 times in different spots on step on each LE. no UE support, working on SLS time with dynamic movements involved.  Balance holds x 45 sec on inline wedge each of: static, horizontal and vertical head turns   TE: [employer] leg pres, setting 6 x 10, then setting 10 x 10, pt able to complete sets with ease, indicating LE power is not an area of concern for future sessions.    PATIENT EDUCATION: Education details: Educated on Tests and measures, outcomes measures, POC Person educated: Patient Education method: Medical illustrator Education comprehension: verbalized understanding  HOME EXERCISE PROGRAM: Access Code: [email or web address] URL:  [email or web address] Date: 11/21/2022  Prepared by: Thresa Ross Exercises  -Ankle Dorsiflexion with Resistance - 1 x daily - 7 x weekly - 2 sets - 15 reps   -Long Sitting Ankle Eversion with Resistance - 1 x daily - 7 x weekly - 2 sets - 15 reps -  -Long Sitting Ankle Inversion with Anchored Resistance - 1 x daily - 7 x weekly - 2 sets - 15 reps  -Ankle dorsiflexion with heels on book and toes on floor to start - 1 x daily - 7 x weekly - 2 sets - 20 reps   GOALS: Goals reviewed with patient? Yes  SHORT TERM GOALS: Target date: 12/07/2022  Patient will be independent in home exercise program to  improve strength/mobility for better functional independence with ADLs. Baseline: No HEP currently Goal status: INITIAL   LONG TERM GOALS: Target date: 01/04/2023  1.  Patient  will complete five times sit to stand test in < 15 seconds indicating an increased LE strength and improved balance. Baseline: 18.14 sec Goal status: INITIAL  2.  Patient will increase FOTO score to equal to or greater than  56  to demonstrate statistically significant improvement in mobility and quality of life.  Baseline: 49 Goal status: INITIAL   3.  Patient will increase MiniBest Test score by > 5 points to demonstrate decreased fall risk during functional activities. Baseline: 14 Goal status: INITIAL   4.  Patient will reduce timed up and go to <11 seconds to reduce fall risk and demonstrate improved transfer/gait ability. Baseline: 13.15 sec Goal status: INITIAL  5.  Patient will reduce Cognitive motor dual task timed up and go within 10% of patients normal TUG times to reduce fall risk and demonstrate improved gait in busy environments Baseline: 19.92 sec  Goal status: INITIAL     ASSESSMENT:  CLINICAL IMPRESSION:  Patient appeared motivated and ready for treatment on this day. Today's session focused on dynamic high level balance activities. Created Hurdle stepping intervention aimed towards increasing pt's ability to complete functional/ work related  tasks.Continue to challenge variable foot tapping and obstacle course navigation. Pt will continue to benefit from skilled physical therapy intervention to address impairments, improve QOL, and attain therapy goals.     OBJECTIVE IMPAIRMENTS: Abnormal gait, decreased balance, and difficulty walking.   ACTIVITY LIMITATIONS: standing, locomotion level, and Longer durations of ambulation.  PARTICIPATION LIMITATIONS: occupation  PERSONAL FACTORS: Past/current experiences and Time since onset of injury/illness/exacerbation are also affecting patient's  functional outcome.   REHAB POTENTIAL: Good  CLINICAL DECISION MAKING: Stable/uncomplicated  EVALUATION COMPLEXITY: Low  PLAN:  PT FREQUENCY: 1-2x/week  PT DURATION: 8 weeks  PLANNED INTERVENTIONS: Therapeutic exercises, Therapeutic activity, Neuromuscular re-education, Balance training, Gait training, Patient/Family education, Self Care, Joint mobilization, Electrical stimulation, Cryotherapy, and Moist heat  PLAN FOR NEXT SESSION: Review HEP and adjust as needed, Dynamic Balance training, High level LE strengthening, wrestle ring tasks; high stepping/ hurdling activities for return to workplace activities.    Cecile Sheerer, SPT   This entire session was performed under direct supervision and direction of a licensed therapist/therapist assistant . I have personally read, edited and approve of the note as written.    This licensed clinician was present and actively directing care throughout the session at all times.  Norman Herrlich PT ,DPT Physical Therapist- [place]  [facility]

## 2022-12-06 ENCOUNTER — Encounter: Payer: Self-pay | Admitting: Physical Therapy

## 2022-12-07 ENCOUNTER — Ambulatory Visit: Payer: 59 | Admitting: Physical Therapy

## 2022-12-07 ENCOUNTER — Encounter: Payer: Self-pay | Admitting: Physical Therapy

## 2022-12-07 DIAGNOSIS — R269 Unspecified abnormalities of gait and mobility: Secondary | ICD-10-CM | POA: Diagnosis not present

## 2022-12-07 DIAGNOSIS — M21372 Foot drop, left foot: Secondary | ICD-10-CM | POA: Diagnosis not present

## 2022-12-07 DIAGNOSIS — R2689 Other abnormalities of gait and mobility: Secondary | ICD-10-CM

## 2022-12-07 DIAGNOSIS — M6281 Muscle weakness (generalized): Secondary | ICD-10-CM | POA: Diagnosis not present

## 2022-12-07 NOTE — Therapy (Signed)
OUTPATIENT PHYSICAL THERAPY NEURO TREATMENT   Patient Name: Dalton Mathis MRN: [id number] DOB:1966-03-21, 57 y.o., male Today's Date: 12/05/2022   PCP:   Smitty Cords, DO   REFERRING PROVIDER:   Smitty Cords, DO    END OF SESSION:  PT End of Session - 12/07/22 1239     Visit Number 7    Number of Visits 30    Date for PT Re-Evaluation 01/04/23    Progress Note Due on Visit 10    PT Start Time 1145    PT Stop Time 1230    PT Time Calculation (min) 45 min    Equipment Utilized During Treatment Gait belt    Activity Tolerance Patient tolerated treatment well    Behavior During Therapy [facility] for tasks assessed/performed                 Past Medical History:  Diagnosis Date   Allergy    Hyperlipidemia    Hypertension 2003   Sleep apnea    Stroke (HCC) 12/2015   Right basal ganglia thought secondary to hypertension.   Past Surgical History:  Procedure Laterality Date   COLONOSCOPY WITH PROPOFOL N/A 12/20/2016   Procedure: COLONOSCOPY WITH PROPOFOL;  Surgeon: Earline Mayotte, MD;  Location: [facility] ENDOSCOPY;  Service: Endoscopy;  Laterality: N/A;   Patient Active Problem List   Diagnosis Date Noted   Normocytic anemia 04/14/2020   Peripheral neuropathy 04/14/2020   History of cerebrovascular accident (CVA) with residual deficit 03/03/2020   Mixed hyperlipidemia 03/03/2020   Benign hypertension with CKD (chronic kidney disease) stage III (HCC) 03/03/2020   Morbid obesity (HCC) 03/03/2020   Encounter for screening colonoscopy 11/15/2016    ONSET DATE: 2017  REFERRING DIAG: Left sided Foot drop, post basal ganglia hemorrhage in 2017  THERAPY DIAG:  Abnormality of gait and mobility  Foot drop, left  Other abnormalities of gait and mobility  Muscle weakness (generalized)  Rationale for Evaluation and Treatment: Rehabilitation  SUBJECTIVE:                                                                                                                                                                                              SUBJECTIVE STATEMENT:  Pt reports no significant changes since last session.  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 He had resultant left-sided weakness and numbness. He went through extensive therapy and recovery. He has continued to have left foot drop weakness that is persistent over time. He  wears a left ankle foot brace.Marland KitchenMarland KitchenMinor numbness on the left side. No pain." 7/15: Per discussion on pt's wresting related tasks: pt often has to enter/exit ring via hurdling through the rope around the ring, plan to incorporate interventions that work on high stepping/ hurdle activities  PAIN:  Are you having pain? No  PRECAUTIONS: None  WEIGHT BEARING RESTRICTIONS: No  FALLS: Has patient fallen in last 6 months? No  LIVING ENVIRONMENT: Lives with: lives with their family Lives in: House/apartment Stairs: Yes: Internal: 4 steps; on right going up, on left going up, and can reach both Has following equipment at home:  [facility]? Uses a L foot brace, uses a cane in new env for safely, PLOF: Independent    PATIENT GOALS: He wants to work on his L foot drop  OBJECTIVE:  (objective measures completed at initial evaluation unless otherwise dated)   LOWER EXTREMITY MMT:    MMT Right Eval Left Eval  Hip flexion 4+ 4+  Hip extension    Hip abduction 4+ 4+  Hip adduction 5 5  Hip internal rotation    Hip external rotation    Knee flexion 4 4-  Knee extension 5 4-  Ankle dorsiflexion 4+ 3  Ankle plantarflexion 4- 4-  Ankle inversion    Ankle eversion    (Blank rows = not tested)   TRANSFERS: Assistive device utilized: None  Sit to stand: Complete Independence Stand to sit: Complete Independence Chair to chair: Complete  Independence Floor:  Reports independence with floor transfers per wrestling activities requiring him to do so.   GAIT: Gait pattern:  Left Foot Drop, decreased ankle dorsiflexion- Left, and poor foot clearance- Left Distance walked: 100 ft Assistive device utilized: None Level of assistance: Complete Independence Comments: Above assessed during MiniBest test.  FUNCTIONAL TESTS:  5 times sit to stand: 18.14 sec Timed up and go (TUG): 13.15 sec MiniBest:14, see flowsheets for more detail.    [facility] PT Assessment - 11/10/22 0001       Mini-BESTest   Sit To Stand Normal: Comes to stand without use of hands and stabilizes independently.    Rise to Toes < 3 s.    Stand on one leg (left) Severe: Unable    Stand on one leg (right) Moderate: < 20 s    Stand on one leg - lowest score 0    Compensatory Stepping Correction - Forward Normal: Recovers independently with a single, large step (second realignement is allowed).    Compensatory Stepping Correction - Backward Moderate: More than one step is required to recover equilibrium    Compensatory Stepping Correction - Left Lateral Moderate: Several steps to recover equilibrium    Compensatory Stepping Correction - Right Lateral Normal: Recovers independently with 1 step (crossover or lateral OK)    Stepping Corredtion Lateral - lowest score 1    Stance - Feet together, eyes open, firm surface  Normal: 30s    Stance - Feet together, eyes closed, foam surface  Severe: Unable    Incline - Eyes Closed Severe: Unable    Change in Gait Speed Moderate: Unable to change walking speed or signs of imbalance    Walk with head turns - Horizontal Moderate: performs head turns with reduction in gait speed.    Walk with pivot turns Normal: Turns with feet close FAST (< 3 steps) with good balance.    Step over obstacles Moderate: Steps over box but touches box OR displays cautious behavior by slowing gait.  Timed UP & GO with Dual Task Moderate: Dual Task  affects either counting OR walking (>10%) when compared to the TUG without Dual Task.   TUG: 13.15, DT TUG: 19.92   Mini-BEST total score 14               PATIENT SURVEYS:  FOTO 49  TODAY'S TREATMENT: DATE: 12/05/2022 Unless otherwise stated, CGA was provided and gait belt donned in order to ensure pt safety   NMR: Sideways on Airex beam with NBOS:  Static stance x 30 sec Horizontal and vertical head turns 2 x 30 secs  On Airex beam:  Side stepping x x 3 down and back length of beam Heel-toe walking x 3 down and back length of beam   Hurdle stepping activity ~15 min for Mulitple repitions back and forth, as well as set up. Set up: yellow TB linked together tied to back of chairs; pt preforming lateral steps over YTB "rope" with UE support on outside of parallel bars, YTB is perpendicular to // bars. Additional set preformed only stepping over with LLE x 8, noting significant fatigue with second set. Lateral stepping preformed on top of blue tri fold mat.  Ambulation over 2 wedges x 6 with wedges set up back to back, working on balance and foot placement. -wedges soft to mimick uneven terrain   PATIENT EDUCATION: Education details: Educated on Tests and measures, outcomes measures, POC Person educated: Patient Education method: Medical illustrator Education comprehension: verbalized understanding  HOME EXERCISE PROGRAM: Access Code: [email or web address] URL:  [email or web address] Date: 11/21/2022  Prepared by: Thresa Ross Exercises  -Ankle Dorsiflexion with Resistance - 1 x daily - 7 x weekly - 2 sets - 15 reps   -Long Sitting Ankle Eversion with Resistance - 1 x daily - 7 x weekly - 2 sets - 15 reps -  -Long Sitting Ankle Inversion with Anchored Resistance - 1 x daily - 7 x weekly - 2 sets - 15 reps  -Ankle dorsiflexion with heels on book and toes on floor to start - 1 x daily - 7 x weekly - 2 sets - 20 reps   GOALS: Goals reviewed with patient?  Yes  SHORT TERM GOALS: Target date: 12/07/2022  Patient will be independent in home exercise program to improve strength/mobility for better functional independence with ADLs. Baseline: No HEP currently Goal status: INITIAL   LONG TERM GOALS: Target date: 01/04/2023  1.  Patient  will complete five times sit to stand test in < 15 seconds indicating an increased LE strength and improved balance. Baseline: 18.14 sec Goal status: INITIAL  2.  Patient will increase FOTO score to equal to or greater than  56  to demonstrate statistically significant improvement in mobility and quality of life.  Baseline: 49 Goal status: INITIAL   3.  Patient will increase MiniBest Test score by > 5 points to demonstrate decreased fall risk during functional activities. Baseline: 14 Goal status: INITIAL   4.  Patient will reduce timed up and go to <11 seconds to reduce fall risk and demonstrate improved transfer/gait ability. Baseline: 13.15 sec Goal status: INITIAL  5.  Patient will reduce Cognitive motor dual task timed up and go within 10% of patients normal TUG times to reduce fall risk and demonstrate improved gait in busy environments Baseline: 19.92 sec  Goal status: INITIAL     ASSESSMENT:  CLINICAL IMPRESSION:  Patient appeared motivated and ready for treatment on this day. Today's session focused on dynamic high level  balance activities, improved upon hurdle/wrestling activity per above with good results.  Continue to challenge variable foot tapping and high level balance activities. Pt had one LOB when preforming hurdles but was able to correct with UE support on // bars. Pt will continue to benefit from skilled physical therapy intervention to address impairments, improve QOL, and attain therapy goals.     OBJECTIVE IMPAIRMENTS: Abnormal gait, decreased balance, and difficulty walking.   ACTIVITY LIMITATIONS: standing, locomotion level, and Longer durations of  ambulation.  PARTICIPATION LIMITATIONS: occupation  PERSONAL FACTORS: Past/current experiences and Time since onset of injury/illness/exacerbation are also affecting patient's functional outcome.   REHAB POTENTIAL: Good  CLINICAL DECISION MAKING: Stable/uncomplicated  EVALUATION COMPLEXITY: Low  PLAN:  PT FREQUENCY: 1-2x/week  PT DURATION: 8 weeks  PLANNED INTERVENTIONS: Therapeutic exercises, Therapeutic activity, Neuromuscular re-education, Balance training, Gait training, Patient/Family education, Self Care, Joint mobilization, Electrical stimulation, Cryotherapy, and Moist heat  PLAN FOR NEXT SESSION: Review HEP and adjust as needed, Dynamic Balance training, High level LE strengthening, wrestle ring tasks; high stepping/ hurdling activities for return to workplace activities.    Cecile Sheerer, SPT   This entire session was performed under direct supervision and direction of a licensed therapist/therapist assistant . I have personally read, edited and approve of the note as written.    This licensed clinician was present and actively directing care throughout the session at all times.  Norman Herrlich PT ,DPT Physical Therapist- [place]  [facility]

## 2022-12-12 ENCOUNTER — Ambulatory Visit: Payer: 59 | Admitting: Physical Therapy

## 2022-12-12 DIAGNOSIS — R2689 Other abnormalities of gait and mobility: Secondary | ICD-10-CM | POA: Diagnosis not present

## 2022-12-12 DIAGNOSIS — M6281 Muscle weakness (generalized): Secondary | ICD-10-CM

## 2022-12-12 DIAGNOSIS — R269 Unspecified abnormalities of gait and mobility: Secondary | ICD-10-CM | POA: Diagnosis not present

## 2022-12-12 DIAGNOSIS — M21372 Foot drop, left foot: Secondary | ICD-10-CM | POA: Diagnosis not present

## 2022-12-12 NOTE — Therapy (Signed)
OUTPATIENT PHYSICAL THERAPY NEURO TREATMENT   Patient Name: Dalton Mathis MRN: [id number] DOB:12-10-1965, 57 y.o., male Today's Date: 12/05/2022   PCP:   Smitty Cords, DO   REFERRING PROVIDER:   Smitty Cords, DO    END OF SESSION:  PT End of Session - 12/12/22 1621     Visit Number 8    Number of Visits 30    Date for PT Re-Evaluation 01/04/23    Progress Note Due on Visit 10    PT Start Time 1619    PT Stop Time 1700    PT Time Calculation (min) 41 min    Equipment Utilized During Treatment Gait belt    Activity Tolerance Patient tolerated treatment well    Behavior During Therapy [facility] for tasks assessed/performed                  Past Medical History:  Diagnosis Date   Allergy    Hyperlipidemia    Hypertension 2003   Sleep apnea    Stroke (HCC) 12/2015   Right basal ganglia thought secondary to hypertension.   Past Surgical History:  Procedure Laterality Date   COLONOSCOPY WITH PROPOFOL N/A 12/20/2016   Procedure: COLONOSCOPY WITH PROPOFOL;  Surgeon: Earline Mayotte, MD;  Location: [facility] ENDOSCOPY;  Service: Endoscopy;  Laterality: N/A;   Patient Active Problem List   Diagnosis Date Noted   Normocytic anemia 04/14/2020   Peripheral neuropathy 04/14/2020   History of cerebrovascular accident (CVA) with residual deficit 03/03/2020   Mixed hyperlipidemia 03/03/2020   Benign hypertension with CKD (chronic kidney disease) stage III (HCC) 03/03/2020   Morbid obesity (HCC) 03/03/2020   Encounter for screening colonoscopy 11/15/2016    ONSET DATE: 2017  REFERRING DIAG: Left sided Foot drop, post basal ganglia hemorrhage in 2017  THERAPY DIAG:  Abnormality of gait and mobility  Foot drop, left  Other abnormalities of gait and mobility  Muscle weakness (generalized)  Rationale for Evaluation and Treatment: Rehabilitation  SUBJECTIVE:                                                                                                                                                                                              SUBJECTIVE STATEMENT:  Pt reports doing well today. Pt denies any recent falls/stumbles since prior session. Pt denies any updates to medications or medical appointment since prior session. Pt reports good compliance with HEP when time permits.    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  He had resultant left-sided weakness and numbness. He went through extensive therapy and recovery. He has continued to have left foot drop weakness that is persistent over time. He wears a left ankle foot brace.Marland KitchenMarland KitchenMinor numbness on the left side. No pain." 7/15: Per discussion on pt's wresting related tasks: pt often has to enter/exit ring via hurdling through the rope around the ring, plan to incorporate interventions that work on high stepping/ hurdle activities  PAIN:  Are you having pain? No  PRECAUTIONS: None  WEIGHT BEARING RESTRICTIONS: No  FALLS: Has patient fallen in last 6 months? No  LIVING ENVIRONMENT: Lives with: lives with their family Lives in: House/apartment Stairs: Yes: Internal: 4 steps; on right going up, on left going up, and can reach both Has following equipment at home:  [facility]? Uses a L foot brace, uses a cane in new env for safely, PLOF: Independent    PATIENT GOALS: He wants to work on his L foot drop  OBJECTIVE:  (objective measures completed at initial evaluation unless otherwise dated)   LOWER EXTREMITY MMT:    MMT Right Eval Left Eval  Hip flexion 4+ 4+  Hip extension    Hip abduction 4+ 4+  Hip adduction 5 5  Hip internal rotation    Hip external rotation    Knee flexion 4 4-  Knee extension 5 4-  Ankle dorsiflexion 4+ 3  Ankle plantarflexion 4- 4-  Ankle inversion    Ankle eversion     (Blank rows = not tested)   TRANSFERS: Assistive device utilized: None  Sit to stand: Complete Independence Stand to sit: Complete Independence Chair to chair: Complete Independence Floor:  Reports independence with floor transfers per wrestling activities requiring him to do so.   GAIT: Gait pattern:  Left Foot Drop, decreased ankle dorsiflexion- Left, and poor foot clearance- Left Distance walked: 100 ft Assistive device utilized: None Level of assistance: Complete Independence Comments: Above assessed during MiniBest test.  FUNCTIONAL TESTS:  5 times sit to stand: 18.14 sec Timed up and go (TUG): 13.15 sec MiniBest:14, see flowsheets for more detail.    [facility] PT Assessment - 11/10/22 0001       Mini-BESTest   Sit To Stand Normal: Comes to stand without use of hands and stabilizes independently.    Rise to Toes < 3 s.    Stand on one leg (left) Severe: Unable    Stand on one leg (right) Moderate: < 20 s    Stand on one leg - lowest score 0    Compensatory Stepping Correction - Forward Normal: Recovers independently with a single, large step (second realignement is allowed).    Compensatory Stepping Correction - Backward Moderate: More than one step is required to recover equilibrium    Compensatory Stepping Correction - Left Lateral Moderate: Several steps to recover equilibrium    Compensatory Stepping Correction - Right Lateral Normal: Recovers independently with 1 step (crossover or lateral OK)    Stepping Corredtion Lateral - lowest score 1    Stance - Feet together, eyes open, firm surface  Normal: 30s    Stance - Feet together, eyes closed, foam surface  Severe: Unable    Incline - Eyes Closed Severe: Unable    Change in Gait Speed Moderate: Unable to change walking speed or signs of imbalance    Walk with head turns - Horizontal Moderate: performs head turns with reduction in gait speed.    Walk with pivot turns Normal: Turns with feet close FAST (<  3 steps) with  good balance.    Step over obstacles Moderate: Steps over box but touches box OR displays cautious behavior by slowing gait.    Timed UP & GO with Dual Task Moderate: Dual Task affects either counting OR walking (>10%) when compared to the TUG without Dual Task.   TUG: 13.15, DT TUG: 19.92   Mini-BEST total score 14               PATIENT SURVEYS:  FOTO 49  TODAY'S TREATMENT: DATE: 12/05/2022 Unless otherwise stated, CGA was provided and gait belt donned in order to ensure pt safety TE : Ankle , dorsiflexion, eversion, and inversion with red Thera-Band x 15 repetitions each direction with patient is left lower extremity.  Left lower extremity propped.  Patient is difficulty with inversion and eversion increase difficulty with eversion greater than inversion this date.  NMR: Sidestep on 2 x 4 wood board himself rounds.  Due to patient's heel raise on shoes/boots this was not able to be performed properly so we pivoted to sidestepping on Airex beam as below Sidestep on airex beam x several rounds -knowledge did not utilize upper extremities throughout.  Patient had intermittent loss of balance but was able to improve balance throughout activity   Standing on 20 degree incline board with dual task activity of scanning for letters on the white board and putting them in a dizzy order.  Several minutes with no loss of balance. Stance on 20 degree incline board with eyes closed x 1 minute Hands on 20 degree incline board with superior and inferior gaze alternating x 10 repetitions -Good overall balance on incline board this date showing progress of initial evaluation patient is unable to maintain balance on incline  Activity Description: blaze pods set up on various objects  Activity Setting:  [facility] Focus setting was selected to refine precision and concentration, isolating specific muscle groups or movements to enhance overall coordination and targeted muscle engagement.  Number of  Pods:  4 Cycles/Sets:  3 Duration (Time or Hit Count):  20   Heggehog tapping activity with hedgehogs on various surfaces x 3 rounds with 2 on L and 1 on R -Multiple reps completed.  Increase difficulty with having objects with right lower extremity due to reliance on left lower extremity balance.   PATIENT EDUCATION: Education details: Educated on Tests and measures, outcomes measures, POC Person educated: Patient Education method: Medical illustrator Education comprehension: verbalized understanding  HOME EXERCISE PROGRAM: Access Code: [email or web address] URL:  [email or web address] Date: 11/21/2022  Prepared by: Thresa Ross Exercises  -Ankle Dorsiflexion with Resistance - 1 x daily - 7 x weekly - 2 sets - 15 reps   -Long Sitting Ankle Eversion with Resistance - 1 x daily - 7 x weekly - 2 sets - 15 reps -  -Long Sitting Ankle Inversion with Anchored Resistance - 1 x daily - 7 x weekly - 2 sets - 15 reps  -Ankle dorsiflexion with heels on book and toes on floor to start - 1 x daily - 7 x weekly - 2 sets - 20 reps   GOALS: Goals reviewed with patient? Yes  SHORT TERM GOALS: Target date: 12/07/2022  Patient will be independent in home exercise program to improve strength/mobility for better functional independence with ADLs. Baseline: No HEP currently Goal status: INITIAL   LONG TERM GOALS: Target date: 01/04/2023  1.  Patient  will complete five times sit to stand test in < 15 seconds indicating an  increased LE strength and improved balance. Baseline: 18.14 sec Goal status: INITIAL  2.  Patient will increase FOTO score to equal to or greater than  56  to demonstrate statistically significant improvement in mobility and quality of life.  Baseline: 49 Goal status: INITIAL   3.  Patient will increase MiniBest Test score by > 5 points to demonstrate decreased fall risk during functional activities. Baseline: 14 Goal status: INITIAL   4.  Patient will reduce  timed up and go to <11 seconds to reduce fall risk and demonstrate improved transfer/gait ability. Baseline: 13.15 sec Goal status: INITIAL  5.  Patient will reduce Cognitive motor dual task timed up and go within 10% of patients normal TUG times to reduce fall risk and demonstrate improved gait in busy environments Baseline: 19.92 sec  Goal status: INITIAL     ASSESSMENT:  CLINICAL IMPRESSION:  Patient presents to therapy with good motivation for completion of physical therapy activities.  Continuing to focus on lower extremity balance and proprioceptive tasks.  Patient able to progress with various activities this date still demonstrates increased instability on the left greater than right lower extremity as well as increased weakness on the left ankle musculature.  Will continue to work on this in future sessions.Pt will continue to benefit from skilled physical therapy intervention to address impairments, improve QOL, and attain therapy goals.    OBJECTIVE IMPAIRMENTS: Abnormal gait, decreased balance, and difficulty walking.   ACTIVITY LIMITATIONS: standing, locomotion level, and Longer durations of ambulation.  PARTICIPATION LIMITATIONS: occupation  PERSONAL FACTORS: Past/current experiences and Time since onset of injury/illness/exacerbation are also affecting patient's functional outcome.   REHAB POTENTIAL: Good  CLINICAL DECISION MAKING: Stable/uncomplicated  EVALUATION COMPLEXITY: Low  PLAN:  PT FREQUENCY: 1-2x/week  PT DURATION: 8 weeks  PLANNED INTERVENTIONS: Therapeutic exercises, Therapeutic activity, Neuromuscular re-education, Balance training, Gait training, Patient/Family education, Self Care, Joint mobilization, Electrical stimulation, Cryotherapy, and Moist heat  PLAN FOR NEXT SESSION: Review HEP and adjust as needed, Dynamic Balance training, High level LE strengthening, wrestle ring tasks; high stepping/ hurdling activities for return to workplace  activities.     Norman Herrlich PT ,DPT Physical Therapist- [place]  [facility]

## 2022-12-14 ENCOUNTER — Ambulatory Visit: Payer: 59 | Admitting: Physical Therapy

## 2022-12-14 DIAGNOSIS — R269 Unspecified abnormalities of gait and mobility: Secondary | ICD-10-CM | POA: Diagnosis not present

## 2022-12-14 DIAGNOSIS — R2689 Other abnormalities of gait and mobility: Secondary | ICD-10-CM

## 2022-12-14 DIAGNOSIS — M6281 Muscle weakness (generalized): Secondary | ICD-10-CM | POA: Diagnosis not present

## 2022-12-14 DIAGNOSIS — M21372 Foot drop, left foot: Secondary | ICD-10-CM | POA: Diagnosis not present

## 2022-12-14 NOTE — Therapy (Addendum)
OUTPATIENT PHYSICAL THERAPY NEURO TREATMENT   Patient Name: Dalton Mathis MRN: [id number] DOB:[birth year], 57 y.o., male Today's Date: 12/05/2022   PCP:   Smitty Cords, DO   REFERRING PROVIDER:   Smitty Cords, DO    END OF SESSION:   PT End of Session - 12/07/22 1239     Visit Number 8   Number of Visits 30    Date for PT Re-Evaluation 01/04/23    Progress Note Due on Visit 10    PT Start Time 0930   PT Stop Time 1013   PT Time Calculation (min) 43 min    Equipment Utilized During Treatment Gait belt    Activity Tolerance Patient tolerated treatment well    Behavior During Therapy WFL for tasks assessed/performed               Past Medical History:  Diagnosis Date   Allergy    Hyperlipidemia    Hypertension 2003   Sleep apnea    Stroke (HCC) 12/2015   Right basal ganglia thought secondary to hypertension.   Past Surgical History:  Procedure Laterality Date   COLONOSCOPY WITH PROPOFOL N/A 12/20/2016   Procedure: COLONOSCOPY WITH PROPOFOL;  Surgeon: Earline Mayotte, MD;  Location: [facility] ENDOSCOPY;  Service: Endoscopy;  Laterality: N/A;   Patient Active Problem List   Diagnosis Date Noted   Normocytic anemia 04/14/2020   Peripheral neuropathy 04/14/2020   History of cerebrovascular accident (CVA) with residual deficit 03/03/2020   Mixed hyperlipidemia 03/03/2020   Benign hypertension with CKD (chronic kidney disease) stage III (HCC) 03/03/2020   Morbid obesity (HCC) 03/03/2020   Encounter for screening colonoscopy 11/15/2016    ONSET DATE: 2017  REFERRING DIAG: Left sided Foot drop, post basal ganglia hemorrhage in 2017  THERAPY DIAG:  Abnormality of gait and mobility  Foot drop, left  Other abnormalities of gait and mobility  Rationale for Evaluation and Treatment: Rehabilitation  SUBJECTIVE:                                                                                                                                                                                              SUBJECTIVE STATEMENT:  Pt reports doing well today. Pt denies any recent falls/stumbles since prior session. Pt denies any updates to medications or medical appointment since prior session. Pt reports good compliance with HEP when time permits.    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 He had resultant left-sided weakness and numbness. He went  through extensive therapy and recovery. He has continued to have left foot drop weakness that is persistent over time. He wears a left ankle foot brace.Marland KitchenMarland KitchenMinor numbness on the left side. No pain." 7/15: Per discussion on pt's wresting related tasks: pt often has to enter/exit ring via hurdling through the rope around the ring, plan to incorporate interventions that work on high stepping/ hurdle activities  PAIN:  Are you having pain? No  PRECAUTIONS: None  WEIGHT BEARING RESTRICTIONS: No  FALLS: Has patient fallen in last 6 months? No  LIVING ENVIRONMENT: Lives with: lives with their family Lives in: House/apartment Stairs: Yes: Internal: 4 steps; on right going up, on left going up, and can reach both Has following equipment at home:  [facility]? Uses a L foot brace, uses a cane in new env for safely, PLOF: Independent    PATIENT GOALS: He wants to work on his L foot drop  OBJECTIVE:  (objective measures completed at initial evaluation unless otherwise dated)   LOWER EXTREMITY MMT:    MMT Right Eval Left Eval  Hip flexion 4+ 4+  Hip extension    Hip abduction 4+ 4+  Hip adduction 5 5  Hip internal rotation    Hip external rotation    Knee flexion 4 4-  Knee extension 5 4-  Ankle dorsiflexion 4+ 3  Ankle plantarflexion 4- 4-  Ankle inversion    Ankle eversion    (Blank rows = not  tested)   TRANSFERS: Assistive device utilized: None  Sit to stand: Complete Independence Stand to sit: Complete Independence Chair to chair: Complete Independence Floor:  Reports independence with floor transfers per wrestling activities requiring him to do so.   GAIT: Gait pattern:  Left Foot Drop, decreased ankle dorsiflexion- Left, and poor foot clearance- Left Distance walked: 100 ft Assistive device utilized: None Level of assistance: Complete Independence Comments: Above assessed during MiniBest test.  FUNCTIONAL TESTS:  5 times sit to stand: 18.14 sec Timed up and go (TUG): 13.15 sec MiniBest:14, see flowsheets for more detail.    [facility] PT Assessment - 11/10/22 0001       Mini-BESTest   Sit To Stand Normal: Comes to stand without use of hands and stabilizes independently.    Rise to Toes < 3 s.    Stand on one leg (left) Severe: Unable    Stand on one leg (right) Moderate: < 20 s    Stand on one leg - lowest score 0    Compensatory Stepping Correction - Forward Normal: Recovers independently with a single, large step (second realignement is allowed).    Compensatory Stepping Correction - Backward Moderate: More than one step is required to recover equilibrium    Compensatory Stepping Correction - Left Lateral Moderate: Several steps to recover equilibrium    Compensatory Stepping Correction - Right Lateral Normal: Recovers independently with 1 step (crossover or lateral OK)    Stepping Corredtion Lateral - lowest score 1    Stance - Feet together, eyes open, firm surface  Normal: 30s    Stance - Feet together, eyes closed, foam surface  Severe: Unable    Incline - Eyes Closed Severe: Unable    Change in Gait Speed Moderate: Unable to change walking speed or signs of imbalance    Walk with head turns - Horizontal Moderate: performs head turns with reduction in gait speed.    Walk with pivot turns Normal: Turns with feet close FAST (< 3 steps) with good balance.  Step over obstacles Moderate: Steps over box but touches box OR displays cautious behavior by slowing gait.    Timed UP & GO with Dual Task Moderate: Dual Task affects either counting OR walking (>10%) when compared to the TUG without Dual Task.   TUG: 13.15, DT TUG: 19.92   Mini-BEST total score 14               PATIENT SURVEYS:  FOTO 49  TODAY'S TREATMENT: DATE: 12/05/2022 Unless otherwise stated, CGA was provided and gait belt donned in order to ensure pt safety TE Ankle DF, EV, INV 2 x 15 reps on L with RTB   Heel raises standing 3 x 15 reps, not able to complete in full range  Seated with foot on bobble board, inversion / eversion x 1.5 min -PF/DF x 1.5 min    NMR:  LE on 1/2 foam rollers x 60 ses Progressed to R LE on airex plus 1/2 foam roller to move more weight to LLE 2 x 45 sec with intermittent head turns  BOSU stance 2 x 45 seconds  Airex R LE march for LLE NM control in stance on unstable surfaces.  Wobble board stance oriented to wobble in frontal plane x 2 min    PATIENT EDUCATION: Education details: Educated on Tests and measures, outcomes measures, POC Person educated: Patient Education method: Medical illustrator Education comprehension: verbalized understanding  HOME EXERCISE PROGRAM: Access Code: [email or web address] URL:  [email or web address] Date: 11/21/2022  Prepared by: Thresa Ross Exercises  -Ankle Dorsiflexion with Resistance - 1 x daily - 7 x weekly - 2 sets - 15 reps   -Long Sitting Ankle Eversion with Resistance - 1 x daily - 7 x weekly - 2 sets - 15 reps -  -Long Sitting Ankle Inversion with Anchored Resistance - 1 x daily - 7 x weekly - 2 sets - 15 reps  -Ankle dorsiflexion with heels on book and toes on floor to start - 1 x daily - 7 x weekly - 2 sets - 20 reps   GOALS: Goals reviewed with patient? Yes  SHORT TERM GOALS: Target date: 12/07/2022  Patient will be independent in home exercise program to improve  strength/mobility for better functional independence with ADLs. Baseline: No HEP currently Goal status: INITIAL   LONG TERM GOALS: Target date: 01/04/2023  1.  Patient  will complete five times sit to stand test in < 15 seconds indicating an increased LE strength and improved balance. Baseline: 18.14 sec Goal status: INITIAL  2.  Patient will increase FOTO score to equal to or greater than  56  to demonstrate statistically significant improvement in mobility and quality of life.  Baseline: 49 Goal status: INITIAL   3.  Patient will increase MiniBest Test score by > 5 points to demonstrate decreased fall risk during functional activities. Baseline: 14 Goal status: INITIAL   4.  Patient will reduce timed up and go to <11 seconds to reduce fall risk and demonstrate improved transfer/gait ability. Baseline: 13.15 sec Goal status: INITIAL  5.  Patient will reduce Cognitive motor dual task timed up and go within 10% of patients normal TUG times to reduce fall risk and demonstrate improved gait in busy environments Baseline: 19.92 sec  Goal status: INITIAL     ASSESSMENT:  CLINICAL IMPRESSION:  Patient presents to therapy with good motivation for completion of physical therapy activities.  Continuing to focus on lower extremity balance and proprioceptive tasks.  Patient able to progress with various activities  this date still demonstrates increased instability on the left greater than right lower extremity as well as increased weakness on the left ankle musculature.  Will continue to work on this in future sessions. Consider "treadmill pawing activity" in wellzone if there is time following goal assessment for progress note. Pt will continue to benefit from skilled physical therapy intervention to address impairments, improve QOL, and attain therapy goals.    OBJECTIVE IMPAIRMENTS: Abnormal gait, decreased balance, and difficulty walking.   ACTIVITY LIMITATIONS: standing, locomotion  level, and Longer durations of ambulation.  PARTICIPATION LIMITATIONS: occupation  PERSONAL FACTORS: Past/current experiences and Time since onset of injury/illness/exacerbation are also affecting patient's functional outcome.   REHAB POTENTIAL: Good  CLINICAL DECISION MAKING: Stable/uncomplicated  EVALUATION COMPLEXITY: Low  PLAN:  PT FREQUENCY: 1-2x/week  PT DURATION: 8 weeks  PLANNED INTERVENTIONS: Therapeutic exercises, Therapeutic activity, Neuromuscular re-education, Balance training, Gait training, Patient/Family education, Self Care, Joint mobilization, Electrical stimulation, Cryotherapy, and Moist heat  PLAN FOR NEXT SESSION: Review HEP and adjust as needed, Dynamic Balance training, High level LE strengthening, wrestle ring tasks; high stepping/ hurdling activities for return to workplace activities.     Norman Herrlich PT ,DPT Physical Therapist- [place]  [facility]

## 2022-12-19 ENCOUNTER — Ambulatory Visit: Payer: 59 | Admitting: Physical Therapy

## 2022-12-19 DIAGNOSIS — R269 Unspecified abnormalities of gait and mobility: Secondary | ICD-10-CM

## 2022-12-19 DIAGNOSIS — M6281 Muscle weakness (generalized): Secondary | ICD-10-CM | POA: Diagnosis not present

## 2022-12-19 DIAGNOSIS — R2689 Other abnormalities of gait and mobility: Secondary | ICD-10-CM | POA: Diagnosis not present

## 2022-12-19 DIAGNOSIS — M21372 Foot drop, left foot: Secondary | ICD-10-CM | POA: Diagnosis not present

## 2022-12-19 NOTE — Therapy (Signed)
OUTPATIENT PHYSICAL THERAPY NEURO TREATMENT/ Physical Therapy Progress Note   Dates of reporting period  11/09/22   to   12/20/22    Patient Name: Dalton Mathis MRN: [id number] DOB:[birth year], 57 y.o., male Today's Date: 12/05/2022   PCP:   Smitty Cords, DO   REFERRING PROVIDER:   Smitty Cords, DO    END OF SESSION:  PT End of Session - 12/19/22 1618     Visit Number 10    Number of Visits 30    Date for PT Re-Evaluation 01/04/23    Progress Note Due on Visit 10    PT Start Time 1618    PT Stop Time 1700    PT Time Calculation (min) 42 min    Equipment Utilized During Treatment Gait belt    Activity Tolerance Patient tolerated treatment well    Behavior During Therapy [facility] for tasks assessed/performed                   Past Medical History:  Diagnosis Date   Allergy    Hyperlipidemia    Hypertension 2003   Sleep apnea    Stroke (HCC) 12/2015   Right basal ganglia thought secondary to hypertension.   Past Surgical History:  Procedure Laterality Date   COLONOSCOPY WITH PROPOFOL N/A 12/20/2016   Procedure: COLONOSCOPY WITH PROPOFOL;  Surgeon: Earline Mayotte, MD;  Location: [facility] ENDOSCOPY;  Service: Endoscopy;  Laterality: N/A;   Patient Active Problem List   Diagnosis Date Noted   Normocytic anemia 04/14/2020   Peripheral neuropathy 04/14/2020   History of cerebrovascular accident (CVA) with residual deficit 03/03/2020   Mixed hyperlipidemia 03/03/2020   Benign hypertension with CKD (chronic kidney disease) stage III (HCC) 03/03/2020   Morbid obesity (HCC) 03/03/2020   Encounter for screening colonoscopy 11/15/2016    ONSET DATE: 2017  REFERRING DIAG: Left sided Foot drop, post basal ganglia hemorrhage in 2017  THERAPY DIAG:  No diagnosis found.  Rationale for Evaluation and Treatment: Rehabilitation  SUBJECTIVE:                                                                                                                                                                                              SUBJECTIVE STATEMENT:  Patient reports doing well today.  He feels he is improving with his ankle stability and balance although he does still experience foot drop when he is unable to hypotension will focus on preventing it.  Patient also reports wearing sneakers because of a little bit more feedback with the floor and feels he has better control over his foot and ankle musculature.  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 He had resultant left-sided weakness and numbness. He went through extensive therapy and recovery. He has continued to have left foot drop weakness that is persistent over time. He wears a left ankle foot brace.Marland KitchenMarland KitchenMinor numbness on the left side. No pain." 7/15: Per discussion on pt's wresting related tasks: pt often has to enter/exit ring via hurdling through the rope around the ring, plan to incorporate interventions that work on high stepping/ hurdle activities  PAIN:  Are you having pain? No  PRECAUTIONS: None  WEIGHT BEARING RESTRICTIONS: No  FALLS: Has patient fallen in last 6 months? No  LIVING ENVIRONMENT: Lives with: lives with their family Lives in: House/apartment Stairs: Yes: Internal: 4 steps; on right going up, on left going up, and can reach both Has following equipment at home:  [facility]? Uses a L foot brace, uses a cane in new env for safely, PLOF: Independent    PATIENT GOALS: He wants to work on his L foot drop  OBJECTIVE:  (objective measures completed at initial evaluation unless otherwise dated)   LOWER EXTREMITY MMT:    MMT Right Eval Left Eval  Hip flexion 4+ 4+  Hip extension    Hip abduction 4+ 4+  Hip adduction 5 5  Hip internal rotation    Hip external rotation    Knee flexion 4  4-  Knee extension 5 4-  Ankle dorsiflexion 4+ 3  Ankle plantarflexion 4- 4-  Ankle inversion    Ankle eversion    (Blank rows = not tested)   TRANSFERS: Assistive device utilized: None  Sit to stand: Complete Independence Stand to sit: Complete Independence Chair to chair: Complete Independence Floor:  Reports independence with floor transfers per wrestling activities requiring him to do so.   GAIT: Gait pattern:  Left Foot Drop, decreased ankle dorsiflexion- Left, and poor foot clearance- Left Distance walked: 100 ft Assistive device utilized: None Level of assistance: Complete Independence Comments: Above assessed during MiniBest test.  FUNCTIONAL TESTS:  5 times sit to stand: 18.14 sec Timed up and go (TUG): 13.15 sec MiniBest:14, see flowsheets for more detail.    [facility] PT Assessment - 11/10/22 0001       Mini-BESTest   Sit To Stand Normal: Comes to stand without use of hands and stabilizes independently.    Rise to Toes < 3 s.    Stand on one leg (left) Severe: Unable    Stand on one leg (right) Moderate: < 20 s    Stand on one leg - lowest score 0    Compensatory Stepping Correction - Forward Normal: Recovers independently with a single, large step (second realignement is allowed).    Compensatory Stepping Correction - Backward Moderate: More than one step is required to recover equilibrium    Compensatory Stepping Correction - Left Lateral Moderate: Several steps to recover equilibrium    Compensatory Stepping Correction - Right Lateral Normal: Recovers independently with 1 step (crossover or lateral OK)    Stepping Corredtion Lateral - lowest score 1    Stance - Feet together, eyes open, firm surface  Normal: 30s    Stance - Feet together, eyes closed, foam surface  Severe: Unable    Incline - Eyes Closed Severe: Unable    Change in Gait Speed Moderate: Unable to change walking speed or signs of imbalance  Walk with head turns - Horizontal Moderate: performs  head turns with reduction in gait speed.    Walk with pivot turns Normal: Turns with feet close FAST (< 3 steps) with good balance.    Step over obstacles Moderate: Steps over box but touches box OR displays cautious behavior by slowing gait.    Timed UP & GO with Dual Task Moderate: Dual Task affects either counting OR walking (>10%) when compared to the TUG without Dual Task.   TUG: 13.15, DT TUG: 19.92   Mini-BEST total score 14               PATIENT SURVEYS:  FOTO 49  TODAY'S TREATMENT:  Unless otherwise stated, CGA was provided and gait belt donned in order to ensure pt safety  Physical therapy treatment session today consisted of completing assessment of goals and administration of testing as demonstrated and documented in flow sheet, treatment, and goals section of this note. Addition treatments may be found below.    [facility] PT Assessment - 12/19/22 0001       Mini-BESTest   Sit To Stand Normal: Comes to stand without use of hands and stabilizes independently.    Rise to Toes Moderate: Heels up, but not full range (smaller than when holding hands), OR noticeable instability for 3 s.    Stand on one leg (left) Moderate: < 20 s    Stand on one leg (right) Moderate: < 20 s    Stand on one leg - lowest score 1    Compensatory Stepping Correction - Forward Moderate: More than one step is required to recover equilibrium    Compensatory Stepping Correction - Backward Moderate: More than one step is required to recover equilibrium    Compensatory Stepping Correction - Left Lateral Normal: Recovers independently with 1 step (crossover or lateral OK)    Compensatory Stepping Correction - Right Lateral Normal: Recovers independently with 1 step (crossover or lateral OK)    Stepping Corredtion Lateral - lowest score 2    Stance - Feet together, eyes open, firm surface  Normal: 30s    Stance - Feet together, eyes closed, foam surface  Moderate: < 30s    Incline - Eyes Closed Moderate:  Stands independently < 30s OR aligns with surface    Change in Gait Speed Normal: Significantly changes walkling speed without imbalance    Walk with head turns - Horizontal Moderate: performs head turns with reduction in gait speed.    Walk with pivot turns Normal: Turns with feet close FAST (< 3 steps) with good balance.    Step over obstacles Normal: Able to step over box with minimal change of gait speed and with good balance.    Timed UP & GO with Dual Task Moderate: Dual Task affects either counting OR walking (>10%) when compared to the TUG without Dual Task.   10.8 normal TUG 16.3 dual task TUG   Mini-BEST total score 20             TE  Seated with foot on wobble board, inversion / eversion x 1.5 min -PF/DF x 1.5 min ,     PATIENT EDUCATION: Education details: Educated on Tests and measures, outcomes measures, POC Person educated: Patient Education method: Medical illustrator Education comprehension: verbalized understanding  HOME EXERCISE PROGRAM: Access Code: [email or web address] URL:  [email or web address] Date: 11/21/2022  Prepared by: Thresa Ross Exercises  -Ankle Dorsiflexion with Resistance - 1 x daily - 7 x weekly - 2 sets -  15 reps   -Long Sitting Ankle Eversion with Resistance - 1 x daily - 7 x weekly - 2 sets - 15 reps -  -Long Sitting Ankle Inversion with Anchored Resistance - 1 x daily - 7 x weekly - 2 sets - 15 reps  -Ankle dorsiflexion with heels on book and toes on floor to start - 1 x daily - 7 x weekly - 2 sets - 20 reps   GOALS: Goals reviewed with patient? Yes  SHORT TERM GOALS: Target date: 12/07/2022  Patient will be independent in home exercise program to improve strength/mobility for better functional independence with ADLs. Baseline: No HEP currently 12/19/22: Pt performing HEP 4 days/week in addition to therapy appointments. Goal status: Met   LONG TERM GOALS: Target date: 01/04/2023  1.  Patient  will complete five times  sit to stand test in < 15 seconds indicating an increased LE strength and improved balance. Baseline: 18.14 sec 12/19/22: 18.4 sec Goal status: Ongoing  2.  Patient will increase FOTO score to equal to or greater than  56  to demonstrate statistically significant improvement in mobility and quality of life.  Baseline: 49 12/19/22: 55 Goal status: Ongoing   3.  Patient will increase MiniBest Test score by > 5 points to demonstrate decreased fall risk during functional activities. Baseline: 14 12/19/22: 20 Goal status: INITIAL   4.  Patient will reduce timed up and go to <11 seconds to reduce fall risk and demonstrate improved transfer/gait ability. Baseline: 13.15 sec 12/19/22: 10.8 sec Goal status: Met  5.  Patient will reduce Cognitive motor dual task timed up and go within 10% of patients normal TUG times to reduce fall risk and demonstrate improved gait in busy environments Baseline: 19.92 sec 12/19/22: 16.3 sec Goal status: Ongoing     ASSESSMENT:  CLINICAL IMPRESSION:  Pt presents to PT for progress note this date.  Patient shows good progress overall with his balance based goals and also feels his left ankle strength and stability is improving.  Patient still has limitation with single-leg balance bilaterally as well as with adjusting for various surfaces but does show significant improvement from initial evaluation with all tasks.  Patient does have difficulty with dual task balance will continue to work on this in future sessions while also targeting his left and right ankle proprioception and balance righting reactions.Pt will continue to benefit from skilled physical therapy intervention to address impairments, improve QOL, and attain therapy goals. Patient's condition has the potential to improve in response to therapy. Maximum improvement is yet to be obtained. The anticipated improvement is attainable and reasonable in a generally predictable time.      OBJECTIVE IMPAIRMENTS:  Abnormal gait, decreased balance, and difficulty walking.   ACTIVITY LIMITATIONS: standing, locomotion level, and Longer durations of ambulation.  PARTICIPATION LIMITATIONS: occupation  PERSONAL FACTORS: Past/current experiences and Time since onset of injury/illness/exacerbation are also affecting patient's functional outcome.   REHAB POTENTIAL: Good  CLINICAL DECISION MAKING: Stable/uncomplicated  EVALUATION COMPLEXITY: Low  PLAN:  PT FREQUENCY: 1-2x/week  PT DURATION: 8 weeks  PLANNED INTERVENTIONS: Therapeutic exercises, Therapeutic activity, Neuromuscular re-education, Balance training, Gait training, Patient/Family education, Self Care, Joint mobilization, Electrical stimulation, Cryotherapy, and Moist heat  PLAN FOR NEXT SESSION: Review HEP and adjust as needed, Dynamic Balance training, High level LE strengthening, wrestle ring tasks; high stepping/ hurdling activities for return to workplace activities.     Norman Herrlich PT ,DPT Physical Therapist- [place]  [facility]

## 2022-12-21 ENCOUNTER — Ambulatory Visit: Payer: 59 | Admitting: Physical Therapy

## 2022-12-21 DIAGNOSIS — M6281 Muscle weakness (generalized): Secondary | ICD-10-CM

## 2022-12-21 DIAGNOSIS — R2689 Other abnormalities of gait and mobility: Secondary | ICD-10-CM

## 2022-12-21 DIAGNOSIS — M21372 Foot drop, left foot: Secondary | ICD-10-CM | POA: Diagnosis not present

## 2022-12-21 DIAGNOSIS — R269 Unspecified abnormalities of gait and mobility: Secondary | ICD-10-CM | POA: Diagnosis not present

## 2022-12-21 NOTE — Therapy (Signed)
OUTPATIENT PHYSICAL THERAPY NEURO TREATMENT   Patient Name: Dalton Mathis MRN: [id number] DOB:1965-09-20, 57 y.o., male Today's Date: 12/05/2022   PCP:   Smitty Cords, DO   REFERRING PROVIDER:   Smitty Cords, DO    END OF SESSION:  PT End of Session - 12/21/22 1145     Visit Number 11    Number of Visits 30    Date for PT Re-Evaluation 01/04/23    Progress Note Due on Visit 20    PT Start Time 1145    PT Stop Time 1228    PT Time Calculation (min) 43 min    Equipment Utilized During Treatment Gait belt    Activity Tolerance Patient tolerated treatment well    Behavior During Therapy WFL for tasks assessed/performed                    Past Medical History:  Diagnosis Date   Allergy    Hyperlipidemia    Hypertension 2003   Sleep apnea    Stroke (HCC) 12/2015   Right basal ganglia thought secondary to hypertension.   Past Surgical History:  Procedure Laterality Date   COLONOSCOPY WITH PROPOFOL N/A 12/20/2016   Procedure: COLONOSCOPY WITH PROPOFOL;  Surgeon: Earline Mayotte, MD;  Location: [facility] ENDOSCOPY;  Service: Endoscopy;  Laterality: N/A;   Patient Active Problem List   Diagnosis Date Noted   Normocytic anemia 04/14/2020   Peripheral neuropathy 04/14/2020   History of cerebrovascular accident (CVA) with residual deficit 03/03/2020   Mixed hyperlipidemia 03/03/2020   Benign hypertension with CKD (chronic kidney disease) stage III (HCC) 03/03/2020   Morbid obesity (HCC) 03/03/2020   Encounter for screening colonoscopy 11/15/2016    ONSET DATE: 2017  REFERRING DIAG: Left sided Foot drop, post basal ganglia hemorrhage in 2017  THERAPY DIAG:  Abnormality of gait and mobility  Other abnormalities of gait and mobility  Muscle weakness (generalized)  Rationale for Evaluation and Treatment: Rehabilitation  SUBJECTIVE:                                                                                                                                                                                              SUBJECTIVE STATEMENT:  Patient reports doing well today.  No significant changes since previous session.   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 He had resultant left-sided weakness and numbness. He went through extensive therapy and recovery. He has continued to have left foot drop weakness that is  persistent over time. He wears a left ankle foot brace.Marland KitchenMarland KitchenMinor numbness on the left side. No pain." 7/15: Per discussion on pt's wresting related tasks: pt often has to enter/exit ring via hurdling through the rope around the ring, plan to incorporate interventions that work on high stepping/ hurdle activities  PAIN:  Are you having pain? No  PRECAUTIONS: None  WEIGHT BEARING RESTRICTIONS: No  FALLS: Has patient fallen in last 6 months? No  LIVING ENVIRONMENT: Lives with: lives with their family Lives in: House/apartment Stairs: Yes: Internal: 4 steps; on right going up, on left going up, and can reach both Has following equipment at home:  [facility]? Uses a L foot brace, uses a cane in new env for safely, PLOF: Independent    PATIENT GOALS: He wants to work on his L foot drop  OBJECTIVE:  (objective measures completed at initial evaluation unless otherwise dated)   LOWER EXTREMITY MMT:    MMT Right Eval Left Eval  Hip flexion 4+ 4+  Hip extension    Hip abduction 4+ 4+  Hip adduction 5 5  Hip internal rotation    Hip external rotation    Knee flexion 4 4-  Knee extension 5 4-  Ankle dorsiflexion 4+ 3  Ankle plantarflexion 4- 4-  Ankle inversion    Ankle eversion    (Blank rows = not tested)   TRANSFERS: Assistive device utilized: None  Sit to stand: Complete Independence Stand to sit: Complete Independence Chair  to chair: Complete Independence Floor:  Reports independence with floor transfers per wrestling activities requiring him to do so.   GAIT: Gait pattern:  Left Foot Drop, decreased ankle dorsiflexion- Left, and poor foot clearance- Left Distance walked: 100 ft Assistive device utilized: None Level of assistance: Complete Independence Comments: Above assessed during MiniBest test.  FUNCTIONAL TESTS:  5 times sit to stand: 18.14 sec Timed up and go (TUG): 13.15 sec MiniBest:14, see flowsheets for more detail.    [facility] PT Assessment - 11/10/22 0001       Mini-BESTest   Sit To Stand Normal: Comes to stand without use of hands and stabilizes independently.    Rise to Toes < 3 s.    Stand on one leg (left) Severe: Unable    Stand on one leg (right) Moderate: < 20 s    Stand on one leg - lowest score 0    Compensatory Stepping Correction - Forward Normal: Recovers independently with a single, large step (second realignement is allowed).    Compensatory Stepping Correction - Backward Moderate: More than one step is required to recover equilibrium    Compensatory Stepping Correction - Left Lateral Moderate: Several steps to recover equilibrium    Compensatory Stepping Correction - Right Lateral Normal: Recovers independently with 1 step (crossover or lateral OK)    Stepping Corredtion Lateral - lowest score 1    Stance - Feet together, eyes open, firm surface  Normal: 30s    Stance - Feet together, eyes closed, foam surface  Severe: Unable    Incline - Eyes Closed Severe: Unable    Change in Gait Speed Moderate: Unable to change walking speed or signs of imbalance    Walk with head turns - Horizontal Moderate: performs head turns with reduction in gait speed.    Walk with pivot turns Normal: Turns with feet close FAST (< 3 steps) with good balance.    Step over obstacles Moderate: Steps over box but touches box OR displays cautious behavior by slowing  gait.    Timed UP & GO with Dual Task  Moderate: Dual Task affects either counting OR walking (>10%) when compared to the TUG without Dual Task.   TUG: 13.15, DT TUG: 19.92   Mini-BEST total score 14               PATIENT SURVEYS:  FOTO 49  TODAY'S TREATMENT:  Unless otherwise stated, CGA was provided and gait belt donned in order to ensure pt safety  Ankle 3 way with RTB, 2 x 15 ea   Heel strike and foot slap control training x 10 with LLE on 1/2 foam stationary controlling PF with good eccentric control 2 x 10 with step to the 1/2 foam   Dynamic standing march x 10 on ea LE with 3 sec holds.   LLE airex R on step with visual scanning playing I spy x 3 min   SLS practice 2 x 1 min ea LE, able to hold greater than 30 sec on the R on second round, LLE approx 15 sec on best attempt, UE use when LOB then continuing with reps until 1 min reached   Split stance LLE post with EC   LLE post and R LE on airex 2 x 1 min with EC  Side step airex up and down L side only x 10 reps      PATIENT EDUCATION: Education details: Educated on Tests and measures, outcomes measures, POC Person educated: Patient Education method: Medical illustrator Education comprehension: verbalized understanding  HOME EXERCISE PROGRAM: Access Code: [email or web address] URL:  [email or web address] Date: 11/21/2022  Prepared by: Thresa Ross Exercises  -Ankle Dorsiflexion with Resistance - 1 x daily - 7 x weekly - 2 sets - 15 reps   -Long Sitting Ankle Eversion with Resistance - 1 x daily - 7 x weekly - 2 sets - 15 reps -  -Long Sitting Ankle Inversion with Anchored Resistance - 1 x daily - 7 x weekly - 2 sets - 15 reps  -Ankle dorsiflexion with heels on book and toes on floor to start - 1 x daily - 7 x weekly - 2 sets - 20 reps   GOALS: Goals reviewed with patient? Yes  SHORT TERM GOALS: Target date: 12/07/2022  Patient will be independent in home exercise program to improve strength/mobility for better functional  independence with ADLs. Baseline: No HEP currently 12/19/22: Pt performing HEP 4 days/week in addition to therapy appointments. Goal status: Met   LONG TERM GOALS: Target date: 01/04/2023  1.  Patient  will complete five times sit to stand test in < 15 seconds indicating an increased LE strength and improved balance. Baseline: 18.14 sec 12/19/22: 18.4 sec Goal status: Ongoing  2.  Patient will increase FOTO score to equal to or greater than  56  to demonstrate statistically significant improvement in mobility and quality of life.  Baseline: 49 12/19/22: 55 Goal status: Ongoing   3.  Patient will increase MiniBest Test score by > 5 points to demonstrate decreased fall risk during functional activities. Baseline: 14 12/19/22: 20 Goal status: INITIAL   4.  Patient will reduce timed up and go to <11 seconds to reduce fall risk and demonstrate improved transfer/gait ability. Baseline: 13.15 sec 12/19/22: 10.8 sec Goal status: Met  5.  Patient will reduce Cognitive motor dual task timed up and go within 10% of patients normal TUG times to reduce fall risk and demonstrate improved gait in busy environments Baseline: 19.92 sec 12/19/22: 16.3 sec Goal status:  Ongoing     ASSESSMENT:  CLINICAL IMPRESSION:   Patient presents with good motivation for completion of physical therapy activities.  Patient continuing to improve with overall balance as well as left ankle righting strategies utilizing both the hip and ankle strategies for improving his balance.Pt shows improved SLS on the R and L this date near end of interventions and was instructed in ways to practice this functionally. Pt will continue to benefit from skilled physical therapy intervention to address impairments, improve QOL, and attain therapy goals.     OBJECTIVE IMPAIRMENTS: Abnormal gait, decreased balance, and difficulty walking.   ACTIVITY LIMITATIONS: standing, locomotion level, and Longer durations of  ambulation.  PARTICIPATION LIMITATIONS: occupation  PERSONAL FACTORS: Past/current experiences and Time since onset of injury/illness/exacerbation are also affecting patient's functional outcome.   REHAB POTENTIAL: Good  CLINICAL DECISION MAKING: Stable/uncomplicated  EVALUATION COMPLEXITY: Low  PLAN:  PT FREQUENCY: 1-2x/week  PT DURATION: 8 weeks  PLANNED INTERVENTIONS: Therapeutic exercises, Therapeutic activity, Neuromuscular re-education, Balance training, Gait training, Patient/Family education, Self Care, Joint mobilization, Electrical stimulation, Cryotherapy, and Moist heat  PLAN FOR NEXT SESSION: Review HEP and adjust as needed, Dynamic Balance training, High level LE strengthening, wrestle ring tasks; high stepping/ hurdling activities for return to workplace activities.     Norman Herrlich PT ,DPT Physical Therapist- [place]  [facility]

## 2022-12-26 ENCOUNTER — Ambulatory Visit: Payer: 59 | Admitting: Physical Therapy

## 2022-12-28 ENCOUNTER — Ambulatory Visit: Payer: 59 | Attending: Family Medicine | Admitting: Physical Therapy

## 2022-12-28 ENCOUNTER — Encounter: Payer: Self-pay | Admitting: Physical Therapy

## 2022-12-28 DIAGNOSIS — R2689 Other abnormalities of gait and mobility: Secondary | ICD-10-CM | POA: Diagnosis not present

## 2022-12-28 DIAGNOSIS — M21372 Foot drop, left foot: Secondary | ICD-10-CM | POA: Insufficient documentation

## 2022-12-28 DIAGNOSIS — M6281 Muscle weakness (generalized): Secondary | ICD-10-CM | POA: Diagnosis not present

## 2022-12-28 DIAGNOSIS — R269 Unspecified abnormalities of gait and mobility: Secondary | ICD-10-CM | POA: Diagnosis not present

## 2022-12-28 NOTE — Therapy (Signed)
OUTPATIENT PHYSICAL THERAPY NEURO TREATMENT   Patient Name: Dalton Mathis MRN: [id number] DOB:Sep 29, 1965, 57 y.o., male Today's Date: 12/05/2022   PCP:   Smitty Cords, DO   REFERRING PROVIDER:   Smitty Cords, DO    END OF SESSION:  PT End of Session - 12/28/22 0811     Visit Number 12    Number of Visits 30    Date for PT Re-Evaluation 01/04/23    Progress Note Due on Visit 20    PT Start Time 0805    PT Stop Time 0844    PT Time Calculation (min) 39 min    Equipment Utilized During Treatment Gait belt    Activity Tolerance Patient tolerated treatment well    Behavior During Therapy [facility] for tasks assessed/performed                    Past Medical History:  Diagnosis Date   Allergy    Hyperlipidemia    Hypertension 2003   Sleep apnea    Stroke (HCC) 12/2015   Right basal ganglia thought secondary to hypertension.   Past Surgical History:  Procedure Laterality Date   COLONOSCOPY WITH PROPOFOL N/A 12/20/2016   Procedure: COLONOSCOPY WITH PROPOFOL;  Surgeon: Earline Mayotte, MD;  Location: [facility] ENDOSCOPY;  Service: Endoscopy;  Laterality: N/A;   Patient Active Problem List   Diagnosis Date Noted   Normocytic anemia 04/14/2020   Peripheral neuropathy 04/14/2020   History of cerebrovascular accident (CVA) with residual deficit 03/03/2020   Mixed hyperlipidemia 03/03/2020   Benign hypertension with CKD (chronic kidney disease) stage III (HCC) 03/03/2020   Morbid obesity (HCC) 03/03/2020   Encounter for screening colonoscopy 11/15/2016    ONSET DATE: 2017  REFERRING DIAG: Left sided Foot drop, post basal ganglia hemorrhage in 2017  THERAPY DIAG:  Abnormality of gait and mobility  Other abnormalities of gait and mobility  Muscle weakness (generalized)  Foot drop, left  Rationale for Evaluation and Treatment: Rehabilitation  SUBJECTIVE:                                                                                                                                                                                              SUBJECTIVE STATEMENT:  Patient reports doing well today.  Has started trying to use his LLE AFO less in order to challenge his DF muscle activation throughout the day at work with good success.    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 He  had resultant left-sided weakness and numbness. He went through extensive therapy and recovery. He has continued to have left foot drop weakness that is persistent over time. He wears a left ankle foot brace.Marland KitchenMarland KitchenMinor numbness on the left side. No pain." 7/15: Per discussion on pt's wresting related tasks: pt often has to enter/exit ring via hurdling through the rope around the ring, plan to incorporate interventions that work on high stepping/ hurdle activities  PAIN:  Are you having pain? No  PRECAUTIONS: None  WEIGHT BEARING RESTRICTIONS: No  FALLS: Has patient fallen in last 6 months? No  LIVING ENVIRONMENT: Lives with: lives with their family Lives in: House/apartment Stairs: Yes: Internal: 4 steps; on right going up, on left going up, and can reach both Has following equipment at home:  [facility]? Uses a L foot brace, uses a cane in new env for safely, PLOF: Independent    PATIENT GOALS: He wants to work on his L foot drop  OBJECTIVE:  (objective measures completed at initial evaluation unless otherwise dated)   LOWER EXTREMITY MMT:    MMT Right Eval Left Eval  Hip flexion 4+ 4+  Hip extension    Hip abduction 4+ 4+  Hip adduction 5 5  Hip internal rotation    Hip external rotation    Knee flexion 4 4-  Knee extension 5 4-  Ankle dorsiflexion 4+ 3  Ankle plantarflexion 4- 4-  Ankle inversion    Ankle eversion    (Blank rows = not  tested)   TRANSFERS: Assistive device utilized: None  Sit to stand: Complete Independence Stand to sit: Complete Independence Chair to chair: Complete Independence Floor:  Reports independence with floor transfers per wrestling activities requiring him to do so.   GAIT: Gait pattern:  Left Foot Drop, decreased ankle dorsiflexion- Left, and poor foot clearance- Left Distance walked: 100 ft Assistive device utilized: None Level of assistance: Complete Independence Comments: Above assessed during MiniBest test.  FUNCTIONAL TESTS:  5 times sit to stand: 18.14 sec Timed up and go (TUG): 13.15 sec MiniBest:14, see flowsheets for more detail.    [facility] PT Assessment - 11/10/22 0001       Mini-BESTest   Sit To Stand Normal: Comes to stand without use of hands and stabilizes independently.    Rise to Toes < 3 s.    Stand on one leg (left) Severe: Unable    Stand on one leg (right) Moderate: < 20 s    Stand on one leg - lowest score 0    Compensatory Stepping Correction - Forward Normal: Recovers independently with a single, large step (second realignement is allowed).    Compensatory Stepping Correction - Backward Moderate: More than one step is required to recover equilibrium    Compensatory Stepping Correction - Left Lateral Moderate: Several steps to recover equilibrium    Compensatory Stepping Correction - Right Lateral Normal: Recovers independently with 1 step (crossover or lateral OK)    Stepping Corredtion Lateral - lowest score 1    Stance - Feet together, eyes open, firm surface  Normal: 30s    Stance - Feet together, eyes closed, foam surface  Severe: Unable    Incline - Eyes Closed Severe: Unable    Change in Gait Speed Moderate: Unable to change walking speed or signs of imbalance    Walk with head turns - Horizontal Moderate: performs head turns with reduction in gait speed.    Walk with pivot turns Normal: Turns with feet close FAST (<  3 steps) with good balance.     Step over obstacles Moderate: Steps over box but touches box OR displays cautious behavior by slowing gait.    Timed UP & GO with Dual Task Moderate: Dual Task affects either counting OR walking (>10%) when compared to the TUG without Dual Task.   TUG: 13.15, DT TUG: 19.92   Mini-BEST total score 14               PATIENT SURVEYS:  FOTO 49  TODAY'S TREATMENT:  Unless otherwise stated, CGA was provided and gait belt donned in order to ensure pt safety TE Nustep level 2 x 4 min due to tightness noted prior to session.   NMR Step up to 5 inch step plus airex  - took off airex no UE, intermittent UE support, improved with practice   TE Seated ankle rocker board with 10# AW resistance on end of board x 20 ea side of resistance   NMR Side Step up to 5 inch step plus airex with UE x 10 ea LE  - no airex and no UE x 10 ea LE   TE Seated ankle DF with YTB 3 x 10, proided with band for home practice   NMR Step up and side step up to airex x 10 ea LE  -intermittent UE use.   Forward NBOS gait with head turn and object finding  - retro gati same as above ( for dual task balance)      PATIENT EDUCATION: Education details: Educated on Tests and measures, outcomes measures, POC Person educated: Patient Education method: Medical illustrator Education comprehension: verbalized understanding  HOME EXERCISE PROGRAM: Access Code: [email or web address] URL:  [email or web address] Date: 11/21/2022  Prepared by: Thresa Ross Exercises  -Ankle Dorsiflexion with Resistance - 1 x daily - 7 x weekly - 2 sets - 15 reps   -Long Sitting Ankle Eversion with Resistance - 1 x daily - 7 x weekly - 2 sets - 15 reps -  -Long Sitting Ankle Inversion with Anchored Resistance - 1 x daily - 7 x weekly - 2 sets - 15 reps  -Ankle dorsiflexion with heels on book and toes on floor to start - 1 x daily - 7 x weekly - 2 sets - 20 reps   GOALS: Goals reviewed with patient? Yes  SHORT  TERM GOALS: Target date: 12/07/2022  Patient will be independent in home exercise program to improve strength/mobility for better functional independence with ADLs. Baseline: No HEP currently 12/19/22: Pt performing HEP 4 days/week in addition to therapy appointments. Goal status: Met   LONG TERM GOALS: Target date: 01/04/2023  1.  Patient  will complete five times sit to stand test in < 15 seconds indicating an increased LE strength and improved balance. Baseline: 18.14 sec 12/19/22: 18.4 sec Goal status: Ongoing  2.  Patient will increase FOTO score to equal to or greater than  56  to demonstrate statistically significant improvement in mobility and quality of life.  Baseline: 49 12/19/22: 55 Goal status: Ongoing   3.  Patient will increase MiniBest Test score by > 5 points to demonstrate decreased fall risk during functional activities. Baseline: 14 12/19/22: 20 Goal status: MET    4.  Patient will reduce timed up and go to <11 seconds to reduce fall risk and demonstrate improved transfer/gait ability. Baseline: 13.15 sec 12/19/22: 10.8 sec Goal status: Met  5.  Patient will reduce Cognitive motor dual task timed up and go within 10% of patients  normal TUG times to reduce fall risk and demonstrate improved gait in busy environments Baseline: 19.92 sec 12/19/22: 16.3 sec Goal status: Ongoing     ASSESSMENT:  CLINICAL IMPRESSION:   Patient presents with good motivation for completion of physical therapy activities.  Patient continues with high-level dynamic stability on bilateral lower extremities in order to improve stability with ambulation.  Patient also continues to work on left ankle dorsiflexor muscle activation and endurance training with multiple interventions this session.  Patient also working on dual task balance near end of session.Pt will continue to benefit from skilled physical therapy intervention to address impairments, improve QOL, and attain therapy goals.       OBJECTIVE IMPAIRMENTS: Abnormal gait, decreased balance, and difficulty walking.   ACTIVITY LIMITATIONS: standing, locomotion level, and Longer durations of ambulation.  PARTICIPATION LIMITATIONS: occupation  PERSONAL FACTORS: Past/current experiences and Time since onset of injury/illness/exacerbation are also affecting patient's functional outcome.   REHAB POTENTIAL: Good  CLINICAL DECISION MAKING: Stable/uncomplicated  EVALUATION COMPLEXITY: Low  PLAN:  PT FREQUENCY: 1-2x/week  PT DURATION: 8 weeks  PLANNED INTERVENTIONS: Therapeutic exercises, Therapeutic activity, Neuromuscular re-education, Balance training, Gait training, Patient/Family education, Self Care, Joint mobilization, Electrical stimulation, Cryotherapy, and Moist heat  PLAN FOR NEXT SESSION: Review HEP and adjust as needed, Dynamic Balance training, High level LE strengthening, wrestle ring tasks; high stepping/ hurdling activities for return to workplace activities.     Norman Herrlich PT ,DPT Physical Therapist-   [facility]

## 2023-01-02 ENCOUNTER — Ambulatory Visit: Payer: 59 | Admitting: Physical Therapy

## 2023-01-02 DIAGNOSIS — M6281 Muscle weakness (generalized): Secondary | ICD-10-CM | POA: Diagnosis not present

## 2023-01-02 DIAGNOSIS — M21372 Foot drop, left foot: Secondary | ICD-10-CM | POA: Diagnosis not present

## 2023-01-02 DIAGNOSIS — R269 Unspecified abnormalities of gait and mobility: Secondary | ICD-10-CM

## 2023-01-02 DIAGNOSIS — R2689 Other abnormalities of gait and mobility: Secondary | ICD-10-CM | POA: Diagnosis not present

## 2023-01-02 NOTE — Therapy (Unsigned)
OUTPATIENT PHYSICAL THERAPY NEURO TREATMENT   Patient Name: Dalton Mathis MRN: [id number] DOB:1965/07/09, 57 y.o., male Today's Date: 12/05/2022   PCP:   Smitty Cords, DO   REFERRING PROVIDER:   Smitty Cords, DO    END OF SESSION:  PT End of Session - 01/02/23 1624     Visit Number 13    Number of Visits 30    Date for PT Re-Evaluation 01/04/23    Progress Note Due on Visit 20    PT Start Time 1620    PT Stop Time 1700    PT Time Calculation (min) 40 min    Equipment Utilized During Treatment Gait belt    Activity Tolerance Patient tolerated treatment well    Behavior During Therapy [facility] for tasks assessed/performed                    Past Medical History:  Diagnosis Date   Allergy    Hyperlipidemia    Hypertension 2003   Sleep apnea    Stroke (HCC) 12/2015   Right basal ganglia thought secondary to hypertension.   Past Surgical History:  Procedure Laterality Date   COLONOSCOPY WITH PROPOFOL N/A 12/20/2016   Procedure: COLONOSCOPY WITH PROPOFOL;  Surgeon: Earline Mayotte, MD;  Location: [facility] ENDOSCOPY;  Service: Endoscopy;  Laterality: N/A;   Patient Active Problem List   Diagnosis Date Noted   Normocytic anemia 04/14/2020   Peripheral neuropathy 04/14/2020   History of cerebrovascular accident (CVA) with residual deficit 03/03/2020   Mixed hyperlipidemia 03/03/2020   Benign hypertension with CKD (chronic kidney disease) stage III (HCC) 03/03/2020   Morbid obesity (HCC) 03/03/2020   Encounter for screening colonoscopy 11/15/2016    ONSET DATE: 2017  REFERRING DIAG: Left sided Foot drop, post basal ganglia hemorrhage in 2017  THERAPY DIAG:  Abnormality of gait and mobility  Other abnormalities of gait and mobility  Muscle weakness (generalized)  Rationale for Evaluation and Treatment: Rehabilitation  SUBJECTIVE:                                                                                                                                                                                              SUBJECTIVE STATEMENT:  Patient reports doing well today.  Patient presents with a different lighter weight [place] shoes and is walking with improved foot dropped on his left lower extremity.  Patient reports he still has to pay close attention to his left lower extremity foot drop but when his attention is on it he is able to control it well.  Patient also has been practicing with various ankle strengthening  and ankle motion interventions at home has no questions regarding this.  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 He had resultant left-sided weakness and numbness. He went through extensive therapy and recovery. He has continued to have left foot drop weakness that is persistent over time. He wears a left ankle foot brace.Marland KitchenMarland KitchenMinor numbness on the left side. No pain." 7/15: Per discussion on pt's wresting related tasks: pt often has to enter/exit ring via hurdling through the rope around the ring, plan to incorporate interventions that work on high stepping/ hurdle activities  PAIN:  Are you having pain? No  PRECAUTIONS: None  WEIGHT BEARING RESTRICTIONS: No  FALLS: Has patient fallen in last 6 months? No  LIVING ENVIRONMENT: Lives with: lives with their family Lives in: House/apartment Stairs: Yes: Internal: 4 steps; on right going up, on left going up, and can reach both Has following equipment at home:  [facility]? Uses a L foot brace, uses a cane in new env for safely, PLOF: Independent    PATIENT GOALS: He wants to work on his L foot drop  OBJECTIVE:  (objective measures completed at initial evaluation unless otherwise dated)   LOWER EXTREMITY MMT:    MMT Right Eval Left Eval  Hip flexion 4+ 4+  Hip extension    Hip  abduction 4+ 4+  Hip adduction 5 5  Hip internal rotation    Hip external rotation    Knee flexion 4 4-  Knee extension 5 4-  Ankle dorsiflexion 4+ 3  Ankle plantarflexion 4- 4-  Ankle inversion    Ankle eversion    (Blank rows = not tested)   TRANSFERS: Assistive device utilized: None  Sit to stand: Complete Independence Stand to sit: Complete Independence Chair to chair: Complete Independence Floor:  Reports independence with floor transfers per wrestling activities requiring him to do so.   GAIT: Gait pattern:  Left Foot Drop, decreased ankle dorsiflexion- Left, and poor foot clearance- Left Distance walked: 100 ft Assistive device utilized: None Level of assistance: Complete Independence Comments: Above assessed during MiniBest test.  FUNCTIONAL TESTS:  5 times sit to stand: 18.14 sec Timed up and go (TUG): 13.15 sec MiniBest:14, see flowsheets for more detail.    [facility] PT Assessment - 11/10/22 0001       Mini-BESTest   Sit To Stand Normal: Comes to stand without use of hands and stabilizes independently.    Rise to Toes < 3 s.    Stand on one leg (left) Severe: Unable    Stand on one leg (right) Moderate: < 20 s    Stand on one leg - lowest score 0    Compensatory Stepping Correction - Forward Normal: Recovers independently with a single, large step (second realignement is allowed).    Compensatory Stepping Correction - Backward Moderate: More than one step is required to recover equilibrium    Compensatory Stepping Correction - Left Lateral Moderate: Several steps to recover equilibrium    Compensatory Stepping Correction - Right Lateral Normal: Recovers independently with 1 step (crossover or lateral OK)    Stepping Corredtion Lateral - lowest score 1    Stance - Feet together, eyes open, firm surface  Normal: 30s    Stance - Feet together, eyes closed, foam surface  Severe: Unable    Incline - Eyes Closed Severe: Unable    Change in Gait Speed  Moderate: Unable  to change walking speed or signs of imbalance    Walk with head turns - Horizontal Moderate: performs head turns with reduction in gait speed.    Walk with pivot turns Normal: Turns with feet close FAST (< 3 steps) with good balance.    Step over obstacles Moderate: Steps over box but touches box OR displays cautious behavior by slowing gait.    Timed UP & GO with Dual Task Moderate: Dual Task affects either counting OR walking (>10%) when compared to the TUG without Dual Task.   TUG: 13.15, DT TUG: 19.92   Mini-BEST total score 14               PATIENT SURVEYS:  FOTO 49  TODAY'S TREATMENT:  Unless otherwise stated, CGA was provided and gait belt donned in order to ensure pt safety .   NMR Step up to airex pad on 4 inch step  - 2 x 10 reps Lle on step R LE performing movement   Side step to 4 inch step then another side step to airex pad 2 x 10 reps   Activity Description: stance on airex then lateral taps with R LE working on LLE stability and ankle righting strategies  Activity Setting:  The [employer] Random setting was chosen to enhance cognitive processing and agility, providing an unpredictable environment to simulate real-world scenarios, and fostering quick reactions and adaptability.  Number of Pods:  3 Cycles/Sets:  4 Duration (Time or Hit Count):  20  Ambulation in hallways with dual task challenge of finding objects or letter on the wall with 5# AW and with retro gait.  -pt challenged to maintain speed.   Sidestepping on airex beam with dual task of naming things ( states, countries, olympic sports, etc) x several rounds.   Unless otherwise stated, CGA was provided and gait belt donned in order to ensure pt safety   PATIENT EDUCATION: Education details: Educated on Tests and measures, outcomes measures, POC Person educated: Patient Education method: Medical illustrator Education comprehension: verbalized understanding  HOME EXERCISE  PROGRAM: Access Code: [email or web address] URL:  [email or web address] Date: 11/21/2022  Prepared by: Thresa Ross Exercises  -Ankle Dorsiflexion with Resistance - 1 x daily - 7 x weekly - 2 sets - 15 reps   -Long Sitting Ankle Eversion with Resistance - 1 x daily - 7 x weekly - 2 sets - 15 reps -  -Long Sitting Ankle Inversion with Anchored Resistance - 1 x daily - 7 x weekly - 2 sets - 15 reps  -Ankle dorsiflexion with heels on book and toes on floor to start - 1 x daily - 7 x weekly - 2 sets - 20 reps   GOALS: Goals reviewed with patient? Yes  SHORT TERM GOALS: Target date: 12/07/2022  Patient will be independent in home exercise program to improve strength/mobility for better functional independence with ADLs. Baseline: No HEP currently 12/19/22: Pt performing HEP 4 days/week in addition to therapy appointments. Goal status: Met   LONG TERM GOALS: Target date: 01/04/2023  1.  Patient  will complete five times sit to stand test in < 15 seconds indicating an increased LE strength and improved balance. Baseline: 18.14 sec 12/19/22: 18.4 sec Goal status: Ongoing  2.  Patient will increase FOTO score to equal to or greater than  56  to demonstrate statistically significant improvement in mobility and quality of life.  Baseline: 49 12/19/22: 55 Goal status: Ongoing   3.  Patient will increase  MiniBest Test score by > 5 points to demonstrate decreased fall risk during functional activities. Baseline: 14 12/19/22: 20 Goal status: MET    4.  Patient will reduce timed up and go to <11 seconds to reduce fall risk and demonstrate improved transfer/gait ability. Baseline: 13.15 sec 12/19/22: 10.8 sec Goal status: Met  5.  Patient will reduce Cognitive motor dual task timed up and go within 10% of patients normal TUG times to reduce fall risk and demonstrate improved gait in busy environments Baseline: 19.92 sec 12/19/22: 16.3 sec Goal status: Ongoing     ASSESSMENT:  CLINICAL  IMPRESSION:   Patient presents with good motivation for completion of physical therapy activities.  Patient continues with high-level dynamic stability on bilateral lower extremities in order to improve stability with ambulation. .  Patient also working on dual task balance throughout end of session.patient will be reassessed for recertification note next visit and pending patient progress will likely be discharged at next visit.  Physical therapist discusses this with patient and patient comfortable with this plan as he has many of the tools to complete these activities at his home now and feels his foot drop has improved with therapy and with instruction.  Pt will continue to benefit from skilled physical therapy intervention to address impairments, improve QOL, and attain therapy goals.      OBJECTIVE IMPAIRMENTS: Abnormal gait, decreased balance, and difficulty walking.   ACTIVITY LIMITATIONS: standing, locomotion level, and Longer durations of ambulation.  PARTICIPATION LIMITATIONS: occupation  PERSONAL FACTORS: Past/current experiences and Time since onset of injury/illness/exacerbation are also affecting patient's functional outcome.   REHAB POTENTIAL: Good  CLINICAL DECISION MAKING: Stable/uncomplicated  EVALUATION COMPLEXITY: Low  PLAN:  PT FREQUENCY: 1-2x/week  PT DURATION: 8 weeks  PLANNED INTERVENTIONS: Therapeutic exercises, Therapeutic activity, Neuromuscular re-education, Balance training, Gait training, Patient/Family education, Self Care, Joint mobilization, Electrical stimulation, Cryotherapy, and Moist heat  PLAN FOR NEXT SESSION: Review HEP and adjust as needed, perform recertification note/address goals and consider discharge at this time.     Norman Herrlich PT ,DPT Physical Therapist- [place]  [facility]

## 2023-01-04 ENCOUNTER — Ambulatory Visit: Payer: 59 | Admitting: Physical Therapy

## 2023-01-04 DIAGNOSIS — R2689 Other abnormalities of gait and mobility: Secondary | ICD-10-CM | POA: Diagnosis not present

## 2023-01-04 DIAGNOSIS — M6281 Muscle weakness (generalized): Secondary | ICD-10-CM

## 2023-01-04 DIAGNOSIS — R269 Unspecified abnormalities of gait and mobility: Secondary | ICD-10-CM

## 2023-01-04 DIAGNOSIS — M21372 Foot drop, left foot: Secondary | ICD-10-CM | POA: Diagnosis not present

## 2023-01-04 NOTE — Therapy (Signed)
OUTPATIENT PHYSICAL THERAPY NEURO TREATMENT/ DISCHARGE   Patient Name: Dalton Mathis MRN: [id number] DOB:[birth year], 57 y.o., male Today's Date: 12/05/2022   PCP:   Smitty Cords, DO   REFERRING PROVIDER:   Smitty Cords, DO    END OF SESSION:           Past Medical History:  Diagnosis Date   Allergy    Hyperlipidemia    Hypertension 2003   Sleep apnea    Stroke [facility]) 12/2015   Right basal ganglia thought secondary to hypertension.   Past Surgical History:  Procedure Laterality Date   COLONOSCOPY WITH PROPOFOL N/A 12/20/2016   Procedure: COLONOSCOPY WITH PROPOFOL;  Surgeon: Earline Mayotte, MD;  Location: [facility] ENDOSCOPY;  Service: Endoscopy;  Laterality: N/A;   Patient Active Problem List   Diagnosis Date Noted   Normocytic anemia 04/14/2020   Peripheral neuropathy 04/14/2020   History of cerebrovascular accident (CVA) with residual deficit 03/03/2020   Mixed hyperlipidemia 03/03/2020   Benign hypertension with CKD (chronic kidney disease) stage III (HCC) 03/03/2020   Morbid obesity (HCC) 03/03/2020   Encounter for screening colonoscopy 11/15/2016    ONSET DATE: 2017  REFERRING DIAG: Left sided Foot drop, post basal ganglia hemorrhage in 2017  THERAPY DIAG:  No diagnosis found.  Rationale for Evaluation and Treatment: Rehabilitation  SUBJECTIVE:                                                                                                                                                                                             SUBJECTIVE STATEMENT:  Patient reports doing well today.    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 He had resultant left-sided weakness and numbness. He went through extensive therapy and recovery. He has continued to have left  foot drop weakness that is persistent over time. He wears a left ankle foot brace.Marland KitchenMarland KitchenMinor numbness on the left side. No pain." 7/15: Per discussion on pt's wresting related tasks: pt often has to enter/exit ring via hurdling through the rope around the ring, plan to incorporate interventions that work on high stepping/ hurdle activities  PAIN:  Are you having pain? No  PRECAUTIONS: None  WEIGHT BEARING RESTRICTIONS: No  FALLS: Has patient fallen in last 6 months? No  LIVING ENVIRONMENT: Lives with: lives with their family Lives in: House/apartment Stairs: Yes: Internal: 4 steps; on right going up, on left going up, and can reach both Has following equipment at home:  [facility]? Uses a L foot brace,  uses a cane in new env for safely, PLOF: Independent    PATIENT GOALS: He wants to work on his L foot drop  OBJECTIVE:  (objective measures completed at initial evaluation unless otherwise dated)   LOWER EXTREMITY MMT:    MMT Right Eval Left Eval  Hip flexion 4+ 4+  Hip extension    Hip abduction 4+ 4+  Hip adduction 5 5  Hip internal rotation    Hip external rotation    Knee flexion 4 4-  Knee extension 5 4-  Ankle dorsiflexion 4+ 3  Ankle plantarflexion 4- 4-  Ankle inversion    Ankle eversion    (Blank rows = not tested)   TRANSFERS: Assistive device utilized: None  Sit to stand: Complete Independence Stand to sit: Complete Independence Chair to chair: Complete Independence Floor:  Reports independence with floor transfers per wrestling activities requiring him to do so.   GAIT: Gait pattern:  Left Foot Drop, decreased ankle dorsiflexion- Left, and poor foot clearance- Left Distance walked: 100 ft Assistive device utilized: None Level of assistance: Complete Independence Comments: Above assessed during MiniBest test.  FUNCTIONAL TESTS:  5 times sit to stand: 18.14 sec Timed up and go (TUG): 13.15 sec MiniBest:14, see flowsheets for more detail.    [facility] PT  Assessment - 11/10/22 0001       Mini-BESTest   Sit To Stand Normal: Comes to stand without use of hands and stabilizes independently.    Rise to Toes < 3 s.    Stand on one leg (left) Severe: Unable    Stand on one leg (right) Moderate: < 20 s    Stand on one leg - lowest score 0    Compensatory Stepping Correction - Forward Normal: Recovers independently with a single, large step (second realignement is allowed).    Compensatory Stepping Correction - Backward Moderate: More than one step is required to recover equilibrium    Compensatory Stepping Correction - Left Lateral Moderate: Several steps to recover equilibrium    Compensatory Stepping Correction - Right Lateral Normal: Recovers independently with 1 step (crossover or lateral OK)    Stepping Corredtion Lateral - lowest score 1    Stance - Feet together, eyes open, firm surface  Normal: 30s    Stance - Feet together, eyes closed, foam surface  Severe: Unable    Incline - Eyes Closed Severe: Unable    Change in Gait Speed Moderate: Unable to change walking speed or signs of imbalance    Walk with head turns - Horizontal Moderate: performs head turns with reduction in gait speed.    Walk with pivot turns Normal: Turns with feet close FAST (< 3 steps) with good balance.    Step over obstacles Moderate: Steps over box but touches box OR displays cautious behavior by slowing gait.    Timed UP & GO with Dual Task Moderate: Dual Task affects either counting OR walking (>10%) when compared to the TUG without Dual Task.   TUG: 13.15, DT TUG: 19.92   Mini-BEST total score 14               PATIENT SURVEYS:  FOTO 49  TODAY'S TREATMENT:  PT instructed pt in TUG then Dual task TUG with task of counting backwards by 3's from a number selected by PT: Normal TUG 10.24  sec ( >13.5 sec indicates increased fall risk) and dual task TUG 10.8 sec ( <10% difference indicates no impairment)  Unless otherwise stated, CGA was provided  and gait  belt donned in order to ensure pt safety   NMR Step up to airex pad on 4 inch step  - 2 x 10 reps Lle on step R LE performing movement   Side step to 4 inch step then another side step to airex pad x 20 reps reps   SLS practice 2 x 30 sec ea LE, UE support as needed.   Activity Description: stance on airex then lateral taps with R LE working on LLE stability and ankle righting strategies  Activity Setting:  The Blaze Pod Random setting was chosen to enhance cognitive processing and agility, providing an unpredictable environment to simulate real-world scenarios, and fostering quick reactions and adaptability.  Number of Pods:  3 Cycles/Sets:  4 Duration (Time or Hit Count):  25  Unless otherwise stated, CGA was provided and gait belt donned in order to ensure pt safety   PATIENT EDUCATION: Education details: Educated on Tests and measures, outcomes measures, POC Person educated: Patient Education method: Medical illustrator Education comprehension: verbalized understanding  HOME EXERCISE PROGRAM: Access Code: [email or web address] URL:  [email or web address] Date: 11/21/2022  Prepared by: Thresa Ross Exercises  -Ankle Dorsiflexion with Resistance - 1 x daily - 7 x weekly - 2 sets - 15 reps   -Long Sitting Ankle Eversion with Resistance - 1 x daily - 7 x weekly - 2 sets - 15 reps -  -Long Sitting Ankle Inversion with Anchored Resistance - 1 x daily - 7 x weekly - 2 sets - 15 reps  -Ankle dorsiflexion with heels on book and toes on floor to start - 1 x daily - 7 x weekly - 2 sets - 20 reps   - Added rocker board activities and SLS practice at home to continue to maintain his progress.   GOALS: Goals reviewed with patient? Yes  SHORT TERM GOALS: Target date: 12/07/2022  Patient will be independent in home exercise program to improve strength/mobility for better functional independence with ADLs. Baseline: No HEP currently 12/19/22: Pt performing HEP 4 days/week  in addition to therapy appointments. Goal status: Met   LONG TERM GOALS: Target date: 01/04/2023  1.  Patient  will complete five times sit to stand test in < 15 seconds indicating an increased LE strength and improved balance. Baseline: 18.14 sec 12/19/22: 18.4 sec 01/04/23: 11 sec  Goal status: MET   2.  Patient will increase FOTO score to equal to or greater than  56  to demonstrate statistically significant improvement in mobility and quality of life.  Baseline: 49 12/19/22: 55 Goal status: NOT MET    3.  Patient will increase MiniBest Test score by > 5 points to demonstrate decreased fall risk during functional activities. Baseline: 14 12/19/22: 20 Goal status: MET    4.  Patient will reduce timed up and go to <11 seconds to reduce fall risk and demonstrate improved transfer/gait ability. Baseline: 13.15 sec 12/19/22: 10.8 sec Goal status: Met  5.  Patient will reduce Cognitive motor dual task timed up and go within 10% of patients normal TUG times to reduce fall risk and demonstrate improved gait in busy environments Baseline: 19.92 sec 12/19/22: 16.3 sec 01/04/23: 10.2 normal TUG and 10.8 dual task tug  Goal status: MET     ASSESSMENT:  CLINICAL IMPRESSION:   Pt presents to PT for discharge therapy this date. Pt has met nearly all goals and is ambulating and functioning at higher functional level at this time per his report and per objective  measures taken today and at previous progress notes. Pt instructed to continue with gym based training and with ankle specific exercises so he can maintain his progress. Pt to be discharged with many HEP exercises and and all questions answered.      OBJECTIVE IMPAIRMENTS: Abnormal gait, decreased balance, and difficulty walking.   ACTIVITY LIMITATIONS: standing, locomotion level, and Longer durations of ambulation.  PARTICIPATION LIMITATIONS: occupation  PERSONAL FACTORS: Past/current experiences and Time since onset of  injury/illness/exacerbation are also affecting patient's functional outcome.   REHAB POTENTIAL: Good  CLINICAL DECISION MAKING: Stable/uncomplicated  EVALUATION COMPLEXITY: Low  PLAN:  PT FREQUENCY: 1-2x/week  PT DURATION: 8 weeks  PLANNED INTERVENTIONS: Therapeutic exercises, Therapeutic activity, Neuromuscular re-education, Balance training, Gait training, Patient/Family education, Self Care, Joint mobilization, Electrical stimulation, Cryotherapy, and Moist heat  PLAN FOR NEXT SESSION:D/C this session      Norman Herrlich PT ,DPT Physical Therapist- [place]  [facility]

## 2023-01-09 ENCOUNTER — Ambulatory Visit: Payer: 59 | Admitting: Physical Therapy

## 2023-01-11 ENCOUNTER — Ambulatory Visit: Payer: 59 | Admitting: Physical Therapy

## 2023-01-16 ENCOUNTER — Ambulatory Visit: Payer: 59 | Admitting: Physical Therapy

## 2023-01-18 ENCOUNTER — Ambulatory Visit: Payer: 59 | Admitting: Physical Therapy

## 2023-01-25 ENCOUNTER — Ambulatory Visit: Payer: 59 | Admitting: Physical Therapy

## 2023-01-30 ENCOUNTER — Ambulatory Visit: Payer: 59 | Admitting: Physical Therapy

## 2023-02-01 ENCOUNTER — Ambulatory Visit: Payer: 59 | Admitting: Physical Therapy

## 2023-02-06 ENCOUNTER — Ambulatory Visit: Payer: 59 | Admitting: Physical Therapy

## 2023-02-08 ENCOUNTER — Ambulatory Visit: Payer: 59 | Admitting: Physical Therapy

## 2023-02-13 ENCOUNTER — Ambulatory Visit: Payer: 59 | Admitting: Physical Therapy

## 2023-02-15 ENCOUNTER — Ambulatory Visit: Payer: 59 | Admitting: Physical Therapy

## 2023-02-20 ENCOUNTER — Ambulatory Visit: Payer: 59 | Admitting: Physical Therapy

## 2023-02-22 ENCOUNTER — Ambulatory Visit: Payer: 59 | Admitting: Physical Therapy

## 2023-02-27 ENCOUNTER — Ambulatory Visit: Payer: 59 | Admitting: Physical Therapy

## 2023-03-01 ENCOUNTER — Ambulatory Visit: Payer: 59 | Admitting: Physical Therapy

## 2023-03-06 ENCOUNTER — Ambulatory Visit: Payer: 59 | Admitting: Physical Therapy

## 2023-03-08 ENCOUNTER — Ambulatory Visit: Payer: 59 | Admitting: Physical Therapy

## 2023-03-13 ENCOUNTER — Ambulatory Visit: Payer: 59 | Admitting: Physical Therapy

## 2023-03-15 ENCOUNTER — Ambulatory Visit: Payer: 59 | Admitting: Physical Therapy

## 2023-03-20 ENCOUNTER — Ambulatory Visit: Payer: 59 | Admitting: Physical Therapy

## 2023-03-22 ENCOUNTER — Ambulatory Visit: Payer: 59 | Admitting: Physical Therapy

## 2023-03-27 ENCOUNTER — Ambulatory Visit: Payer: 59 | Admitting: Physical Therapy

## 2023-03-29 ENCOUNTER — Ambulatory Visit: Payer: 59 | Admitting: Physical Therapy

## 2023-04-03 ENCOUNTER — Ambulatory Visit: Payer: 59 | Admitting: Physical Therapy

## 2023-04-05 ENCOUNTER — Other Ambulatory Visit: Payer: 59

## 2023-04-05 ENCOUNTER — Ambulatory Visit: Payer: 59 | Admitting: Physical Therapy

## 2023-04-05 DIAGNOSIS — E782 Mixed hyperlipidemia: Secondary | ICD-10-CM | POA: Diagnosis not present

## 2023-04-05 DIAGNOSIS — Z0000 Encounter for general adult medical examination without abnormal findings: Secondary | ICD-10-CM

## 2023-04-05 DIAGNOSIS — D649 Anemia, unspecified: Secondary | ICD-10-CM | POA: Diagnosis not present

## 2023-04-05 DIAGNOSIS — Z125 Encounter for screening for malignant neoplasm of prostate: Secondary | ICD-10-CM

## 2023-04-05 DIAGNOSIS — R7309 Other abnormal glucose: Secondary | ICD-10-CM

## 2023-04-06 LAB — COMPLETE METABOLIC PANEL WITH GFR
AG Ratio: 1.2 (calc) (ref 1.0–2.5)
ALT: 25 U/L (ref 9–46)
AST: 33 U/L (ref 10–35)
Albumin: 4.3 g/dL (ref 3.6–5.1)
Alkaline phosphatase (APISO): 76 U/L (ref 35–144)
BUN/Creatinine Ratio: 7 (calc) (ref 6–22)
BUN: 10 mg/dL (ref 7–25)
CO2: 28 mmol/L (ref 20–32)
Calcium: 9.4 mg/dL (ref 8.6–10.3)
Chloride: 104 mmol/L (ref 98–110)
Creat: 1.41 mg/dL — ABNORMAL HIGH (ref 0.70–1.30)
Globulin: 3.5 g/dL (ref 1.9–3.7)
Glucose, Bld: 93 mg/dL (ref 65–99)
Potassium: 3.7 mmol/L (ref 3.5–5.3)
Sodium: 140 mmol/L (ref 135–146)
Total Bilirubin: 1.1 mg/dL (ref 0.2–1.2)
Total Protein: 7.8 g/dL (ref 6.1–8.1)
eGFR: 58 mL/min/{1.73_m2} — ABNORMAL LOW (ref >=60)

## 2023-04-06 LAB — CBC WITH DIFFERENTIAL/PLATELET
Absolute Lymphocytes: 2054 {cells}/uL (ref 850–3900)
Absolute Monocytes: 806 {cells}/uL (ref 200–950)
Basophils Absolute: 38 {cells}/uL (ref 0–200)
Basophils Relative: 0.6 %
Eosinophils Absolute: 416 {cells}/uL (ref 15–500)
Eosinophils Relative: 6.6 %
HCT: 39.2 % (ref 38.5–50.0)
Hemoglobin: 12.8 g/dL — ABNORMAL LOW (ref 13.2–17.1)
MCH: 28.4 pg (ref 27.0–33.0)
MCHC: 32.7 g/dL (ref 32.0–36.0)
MCV: 86.9 fL (ref 80.0–100.0)
MPV: 11.1 fL (ref 7.5–12.5)
Monocytes Relative: 12.8 %
Neutro Abs: 2986 {cells}/uL (ref 1500–7800)
Neutrophils Relative %: 47.4 %
Platelets: 301 10*3/uL (ref 140–400)
RBC: 4.51 10*6/uL (ref 4.20–5.80)
RDW: 14.1 % (ref 11.0–15.0)
Total Lymphocyte: 32.6 %
WBC: 6.3 10*3/uL (ref 3.8–10.8)

## 2023-04-06 LAB — HEMOGLOBIN A1C
Hgb A1c MFr Bld: 5.4 %{Hb} (ref <5.7)
Mean Plasma Glucose: 108 mg/dL
eAG (mmol/L): 6 mmol/L

## 2023-04-06 LAB — LIPID PANEL
Cholesterol: 182 mg/dL (ref <200)
HDL: 39 mg/dL — ABNORMAL LOW (ref >=40)
LDL Cholesterol (Calc): 122 mg/dL — ABNORMAL HIGH
Non-HDL Cholesterol (Calc): 143 mg/dL — ABNORMAL HIGH (ref <130)
Total CHOL/HDL Ratio: 4.7 (calc) (ref <5.0)
Triglycerides: 106 mg/dL (ref <150)

## 2023-04-06 LAB — TSH: TSH: 1.48 m[IU]/L (ref 0.40–4.50)

## 2023-04-06 LAB — PSA: PSA: 0.47 ng/mL (ref <=4.00)

## 2023-04-10 ENCOUNTER — Ambulatory Visit: Payer: 59 | Admitting: Physical Therapy

## 2023-04-12 ENCOUNTER — Encounter: Payer: Self-pay | Admitting: Family Medicine

## 2023-04-12 ENCOUNTER — Ambulatory Visit: Payer: 59 | Admitting: Physical Therapy

## 2023-04-12 ENCOUNTER — Ambulatory Visit (INDEPENDENT_AMBULATORY_CARE_PROVIDER_SITE_OTHER): Payer: 59 | Admitting: Family Medicine

## 2023-04-12 VITALS — BP 130/78 | HR 67 | Ht 75.0 in | Wt 290.4 lb

## 2023-04-12 DIAGNOSIS — E782 Mixed hyperlipidemia: Secondary | ICD-10-CM | POA: Diagnosis not present

## 2023-04-12 DIAGNOSIS — I129 Hypertensive chronic kidney disease with stage 1 through stage 4 chronic kidney disease, or unspecified chronic kidney disease: Secondary | ICD-10-CM | POA: Diagnosis not present

## 2023-04-12 DIAGNOSIS — Z0000 Encounter for general adult medical examination without abnormal findings: Secondary | ICD-10-CM

## 2023-04-12 DIAGNOSIS — Z23 Encounter for immunization: Secondary | ICD-10-CM | POA: Diagnosis not present

## 2023-04-12 DIAGNOSIS — I6930 Unspecified sequelae of cerebral infarction: Secondary | ICD-10-CM

## 2023-04-12 DIAGNOSIS — N183 Chronic kidney disease, stage 3 unspecified: Secondary | ICD-10-CM

## 2023-04-12 MED ORDER — CARVEDILOL 25 MG PO TABS: 25.0000 mg | ORAL_TABLET | Freq: Two times a day (BID) | ORAL | 3 refills | Status: DC

## 2023-04-12 MED ORDER — ATORVASTATIN CALCIUM 20 MG PO TABS: 20.0000 mg | ORAL_TABLET | Freq: Every day | ORAL | 3 refills | Status: DC

## 2023-04-12 NOTE — Assessment & Plan Note (Signed)
Elevated LDL - non adherent to statin lately Inc diet cholesterol  Plan: 1. Continue current meds - Atorvastatin 20mg  daily 2. Encourage improved lifestyle - low carb/cholesterol, reduce portion size, continue improving regular exercise  Discussion on med adherence and diet improve Next step if indicated, dose inc statin vs [id number] Repatha option

## 2023-04-12 NOTE — Progress Notes (Signed)
Subjective:    Patient ID: Dalton Mathis, male    DOB: 09-20-1965, 57 y.o.   MRN: [id number]  Dalton Mathis is a 57 y.o. male presenting on 04/12/2023 for Annual Exam   HPI  Discussed the use of AI scribe software for clinical note transcription with the patient, who gave verbal consent to proceed.  The patient, with a history of hypertension, stroke, and hyperlipidemia, presents for a routine follow-up. He reports no new health issues since the last visit six months ago.       Here for Annual Physical and Lab Review  History of CVA in 2017 Reduced sensation of Left inner foot, and also residual numbness often in Left upper extremity - In past he was on trial of Lyrica, without significant improvement Continues Gabapentin 300mg  nightly, seems to have some increased numbness in Left lower extremity. - He completed PT regimen post-CVA. 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  He uses handicap placard Trying to work on strengthening, and he has problem with abnormal terrain.  Followed by Neuro  Takes Flexeril occasionally, feels groggy if he is concentrating and paying attention he does not have as much problem with the foot drop with ambulation, but if not focusing he will have the issue more    CHRONIC HTN / CKD-III Prior history of labs CKD-III range Cr 1.41 , stable near baseline. Home readings 120-140 Current Meds - Amlodipine 5mg  daily, Carvedilol 25mg  BID, Hydralazine 75mg  (x 3 of 25mg  tab) TID, Lisinopril 20mg  daily  Reports good compliance, did take meds today. Tolerating well, w/o complaints. Admits some lower extremity edema left lower leg - history of chronic recurrent swelling, he always has some numbness in lower ext, over past few days swelling did improve. He continues to drink water. Was not wearing compression. Denies CP,  dyspnea, HA, dizziness / lightheadedness   A1c 5.4   Hyperlipidemia Lab reviewed 03/2023 LDL 122, elevated Continues on Atorvastatin 20mg  daily - he admits non adherent to med. And inc fast food in diet  Obesity BMI >36 The patient expresses a desire to lose weight and has been engaging in chair exercises to increase his heart rate and boost metabolism. He is considering the addition of weight loss medications to his regimen and plans to research insurance coverage for [employer].  Health Maintenance: Flu Shot completed  PSA 0.47 negative  Colonoscopy done 12/20/16 - next due 2028, 10 yr     04/12/2023    3:49 PM 09/30/2022    4:27 PM 04/08/2021    8:25 AM  Depression screen PHQ 2/9  Decreased Interest 0 0 0  Down, Depressed, Hopeless 0 0 0  PHQ - 2 Score 0 0 0  Altered sleeping  0 0  Tired, decreased energy  0 0  Change in appetite  0 0  Feeling bad or failure about yourself   0 0  Trouble concentrating  0 0  Moving slowly or fidgety/restless  0 0  Suicidal thoughts  0 0  PHQ-9 Score  0 0  Difficult doing work/chores  Not difficult at all Not difficult at all       04/12/2023    3:49 PM 09/30/2022    4:27 PM 04/08/2021    8:26 AM 10/06/2020    8:03 AM  GAD 7 : Generalized Anxiety Score  Nervous, Anxious, on Edge  0 0 0 0  Control/stop worrying 0 0 0 0  Worry too much - different things 0 0 0 0  Trouble relaxing 0 0 0 0  Restless 0 0 0 0  Easily annoyed or irritable 0 0 0   Afraid - awful might happen 0 0 0 0  Total GAD 7 Score 0 0 0   Anxiety Difficulty  Not difficult at all Not difficult at all Not difficult at all     Past Medical History:  Diagnosis Date   Allergy    Hyperlipidemia    Hypertension 2003   Sleep apnea    Stroke [facility]) 12/2015   Right basal ganglia thought secondary to hypertension.   Past Surgical History:  Procedure Laterality Date   COLONOSCOPY WITH PROPOFOL N/A 12/20/2016   Procedure: COLONOSCOPY WITH PROPOFOL;  Surgeon:  Earline Mayotte, MD;  Location: [facility] ENDOSCOPY;  Service: Endoscopy;  Laterality: N/A;   Social History   Socioeconomic History   Marital status: Single    Spouse name: Not on file   Number of children: Not on file   Years of education: Not on file   Highest education level: Some college, no degree  Occupational History   Not on file  Tobacco Use   Smoking status: Never   Smokeless tobacco: Never  Substance and Sexual Activity   Alcohol use: Yes    Alcohol/week: 2.0 standard drinks of alcohol    Types: 2 Cans of beer per week   Drug use: No   Sexual activity: Yes    Birth control/protection: Condom  Other Topics Concern   Not on file  Social History Narrative   Not on file   Social Determinants of Health   Financial Resource Strain: Low Risk  (09/26/2022)   Overall Financial Resource Strain (CARDIA)    Difficulty of Paying Living Expenses: Not hard at all  Food Insecurity: No Food Insecurity (09/26/2022)   Hunger Vital Sign    Worried About Running Out of Food in the Last Year: Never true    Ran Out of Food in the Last Year: Never true  Transportation Needs: No Transportation Needs (09/26/2022)   PRAPARE - Administrator, Civil Service (Medical): No    Lack of Transportation (Non-Medical): No  Physical Activity: Sufficiently Active (09/26/2022)   Exercise Vital Sign    Days of Exercise per Week: 5 days    Minutes of Exercise per Session: 30 min  Stress: No Stress Concern Present (09/26/2022)   [employer] of Occupational Health - Occupational Stress Questionnaire    Feeling of Stress : Not at all  Social Connections: Unknown (09/26/2022)   Social Connection and Isolation Panel [NHANES]    Frequency of Communication with Friends and Family: Not on file    Frequency of Social Gatherings with Friends and Family: Once a week    Attends Religious Services: 1 to 4 times per year    Active Member of [employer] or Organizations: Yes    Attends Hospital doctor: More than 4 times per year    Marital Status: Never married  Catering manager Violence: Not on file   Family History  Problem Relation Age of Onset   Colon cancer Neg Hx    Current Outpatient Medications on File Prior to Visit  Medication Sig   amLODipine (NORVASC) 5 MG tablet Take 1 tablet (5 mg total) by mouth daily.   cyclobenzaprine (FLEXERIL) 10 MG tablet Take 1-2 tablets (10-20 mg total)  by mouth at bedtime as needed for muscle spasms.   hydrALAZINE (APRESOLINE) 25 MG tablet Take 3 tablets (75 mg total) by mouth 3 (three) times daily.   lisinopril (ZESTRIL) 20 MG tablet Take 1 tablet (20 mg total) by mouth daily.   Multiple Vitamin (MULTIVITAMIN ADULT PO) Take by mouth.   Probiotic Product (PROBIOTIC ADVANCED PO) Take by mouth.   psyllium (METAMUCIL) 58.6 % powder Take 1 packet by mouth.   No current facility-administered medications on file prior to visit.    Review of Systems  Constitutional:  Negative for activity change, appetite change, chills, diaphoresis, fatigue and fever.  HENT:  Negative for congestion and hearing loss.   Eyes:  Negative for visual disturbance.  Respiratory:  Negative for cough, chest tightness, shortness of breath and wheezing.   Cardiovascular:  Negative for chest pain, palpitations and leg swelling.  Gastrointestinal:  Negative for abdominal pain, constipation, diarrhea, nausea and vomiting.  Genitourinary:  Negative for dysuria, frequency and hematuria.  Musculoskeletal:  Negative for arthralgias and neck pain.  Skin:  Negative for rash.  Neurological:  Negative for dizziness, weakness, light-headedness, numbness and headaches.  Hematological:  Negative for adenopathy.  Psychiatric/Behavioral:  Negative for behavioral problems, dysphoric mood and sleep disturbance.    Per HPI unless specifically indicated above     Objective:    BP 130/78   Pulse 67   Ht 6\' 3"  (1.905 m)   Wt 290 lb 6.4 oz (131.7 kg)   SpO2 98%   BMI 36.30  kg/m   Wt Readings from Last 3 Encounters:  04/12/23 290 lb 6.4 oz (131.7 kg)  10/25/22 289 lb (131.1 kg)  09/30/22 292 lb (132.5 kg)    Physical Exam Vitals and nursing note reviewed.  Constitutional:      General: He is not in acute distress.    Appearance: He is well-developed. He is obese. He is not diaphoretic.     Comments: Well-appearing, comfortable, cooperative  HENT:     Head: Normocephalic and atraumatic.  Eyes:     General:        Right eye: No discharge.        Left eye: No discharge.     Conjunctiva/sclera: Conjunctivae normal.     Pupils: Pupils are equal, round, and reactive to light.  Neck:     Thyroid: No thyromegaly.     Vascular: No carotid bruit.  Cardiovascular:     Rate and Rhythm: Normal rate and regular rhythm.     Pulses: Normal pulses.     Heart sounds: Normal heart sounds. No murmur heard. Pulmonary:     Effort: Pulmonary effort is normal. No respiratory distress.     Breath sounds: Normal breath sounds. No wheezing or rales.  Abdominal:     General: Bowel sounds are normal. There is no distension.     Palpations: Abdomen is soft. There is no mass.     Tenderness: There is no abdominal tenderness.  Musculoskeletal:        General: No tenderness. Normal range of motion.     Cervical back: Normal range of motion and neck supple.     Comments: Upper / Lower Extremities: - Normal muscle tone, strength bilateral upper extremities 5/5, lower extremities 5/5  Lymphadenopathy:     Cervical: No cervical adenopathy.  Skin:    General: Skin is warm and dry.     Findings: No erythema or rash.  Neurological:     Mental Status: He is alert  and oriented to person, place, and time.     Comments: Distal sensation intact to light touch all extremities  Psychiatric:        Mood and Affect: Mood normal.        Behavior: Behavior normal.        Thought Content: Thought content normal.     Comments: Well groomed, good eye contact, normal speech and thoughts      Results for orders placed or performed in visit on 04/05/23  TSH  Result Value Ref Range   TSH 1.48 0.40 - 4.50 mIU/L  PSA  Result Value Ref Range   PSA 0.47 < OR = 4.00 ng/mL  Lipid panel  Result Value Ref Range   Cholesterol 182 <200 mg/dL   HDL 39 (L) > OR = 40 mg/dL   Triglycerides 782 <956 mg/dL   LDL Cholesterol (Calc) 122 (H) mg/dL (calc)   Total CHOL/HDL Ratio 4.7 <5.0 (calc)   Non-HDL Cholesterol (Calc) 143 (H) <130 mg/dL (calc)  Hemoglobin O1H  Result Value Ref Range   Hgb A1c MFr Bld 5.4 <5.7 % of total Hgb   Mean Plasma Glucose 108 mg/dL   eAG (mmol/L) 6.0 mmol/L  CBC with Differential/Platelet  Result Value Ref Range   WBC 6.3 3.8 - 10.8 Thousand/uL   RBC 4.51 4.20 - 5.80 Million/uL   Hemoglobin 12.8 (L) 13.2 - 17.1 g/dL   HCT 08.6 57.8 - 46.9 %   MCV 86.9 80.0 - 100.0 fL   MCH 28.4 27.0 - 33.0 pg   MCHC 32.7 32.0 - 36.0 g/dL   RDW 62.9 52.8 - 41.3 %   Platelets 301 140 - 400 Thousand/uL   MPV 11.1 7.5 - 12.5 fL   Neutro Abs 2,986 1,500 - 7,800 cells/uL   Absolute Lymphocytes 2,054 850 - 3,900 cells/uL   Absolute Monocytes 806 200 - 950 cells/uL   Eosinophils Absolute 416 15 - 500 cells/uL   Basophils Absolute 38 0 - 200 cells/uL   Neutrophils Relative % 47.4 %   Total Lymphocyte 32.6 %   Monocytes Relative 12.8 %   Eosinophils Relative 6.6 %   Basophils Relative 0.6 %  COMPLETE METABOLIC PANEL WITH GFR  Result Value Ref Range   Glucose, Bld 93 65 - 99 mg/dL   BUN 10 7 - 25 mg/dL   Creat 2.44 (H) 0.10 - 1.30 mg/dL   eGFR 58 (L) > OR = 60 mL/min/1.52m2   BUN/Creatinine Ratio 7 6 - 22 (calc)   Sodium 140 135 - 146 mmol/L   Potassium 3.7 3.5 - 5.3 mmol/L   Chloride 104 98 - 110 mmol/L   CO2 28 20 - 32 mmol/L   Calcium 9.4 8.6 - 10.3 mg/dL   Total Protein 7.8 6.1 - 8.1 g/dL   Albumin 4.3 3.6 - 5.1 g/dL   Globulin 3.5 1.9 - 3.7 g/dL (calc)   AG Ratio 1.2 1.0 - 2.5 (calc)   Total Bilirubin 1.1 0.2 - 1.2 mg/dL   Alkaline phosphatase (APISO) 76  35 - 144 U/L   AST 33 10 - 35 U/L   ALT 25 9 - 46 U/L      Assessment & Plan:   Problem List Items Addressed This Visit     Benign hypertension with CKD (chronic kidney disease) stage III (HCC)    Controlled Complication history CVA LLE Edema episodic   Plan:  1. Continue Amlodipine 10mg  2. Continue current BP regimen Carvedilol 25mg  BID, Hydralazine 75mg  (x 3 of  25mg  tab) TID, Lisinopril 20mg  daily 3. Encourage improved lifestyle - low sodium diet, regular exercise 4. Continue monitor BP outside office, bring readings to next visit, if persistently >140/90 or new symptoms notify office sooner      Relevant Medications   atorvastatin (LIPITOR) 20 MG tablet   carvedilol (COREG) 25 MG tablet   Other Relevant Orders   CT CARDIAC SCORING (SELF PAY ONLY)   History of cerebrovascular accident (CVA) with residual deficit    History of CVA 2017 Records reviewed He is on Statin therapy Has some sensation deficit L side chronic problem, left lower extremity foot affects his gait and balance at times but still very functional - discussed today, seems improved on Gabapentin 300mg  nightly less numbness. He is improving activity and muscle strength  Continues to have L Foot Drop  Encourage future consider work with PT or other options, ankle/foot drop brace  Follow w/ Neurology + PT      Relevant Orders   CT CARDIAC SCORING (SELF PAY ONLY)   Mixed hyperlipidemia    Elevated LDL - non adherent to statin lately Inc diet cholesterol  Plan: 1. Continue current meds - Atorvastatin 20mg  daily 2. Encourage improved lifestyle - low carb/cholesterol, reduce portion size, continue improving regular exercise  Discussion on med adherence and diet improve Next step if indicated, dose inc statin vs [id number] Repatha option      Relevant Medications   atorvastatin (LIPITOR) 20 MG tablet   carvedilol (COREG) 25 MG tablet   Other Relevant Orders   CT CARDIAC SCORING (SELF PAY ONLY)   Morbid  obesity (HCC)    BMI >36 with comorbid conditions HYPERTENSION HLD History of CVA Improving lifestyle exercise Goal to improve diet Discussion on wt management medications given limited success >6 months with lifestyle management diet exercise Contact our office with requested Zepbound vs Wegovy if covered or approved - we can order and submit PA  Return 3 months for weight management if start med      Other Visit Diagnoses     Annual physical exam    -  Primary   Needs flu shot       Relevant Orders   Flu vaccine trivalent PF, 6mos and older(Flulaval,Afluria,Fluarix,Fluzone) (Completed)        Updated Health Maintenance information Reviewed recent lab results with patient Encouraged improvement to lifestyle with diet and exercise Goal of weight loss   Chronic Kidney Disease (Stage 3) Stable creatinine in the 1.4 range for several years. No active intervention required, continue monitoring. -Monitor kidney function regularly.  Cardiovascular Health No recent heart imaging or testing noted. Patient has a history of stroke. -Recommended CT scan of the heart for detailed cardiovascular assessment.  General Health Maintenance -Flu vaccine administered. -Colon screening done in 2018, next due in 2028. -Continue monitoring blood pressure, which is currently well-controlled with Hydralazine and Carvedilol.  Follow-up Plans -Check in 3 months for weight management if patient starts weight loss medication. -Patient to inform doctor about decision on weight loss medications. -Schedule heart CT scan.         Orders Placed This Encounter  Procedures   CT CARDIAC SCORING (SELF PAY ONLY)    Standing Status:   Future    Standing Expiration Date:   04/11/2024    Order Specific Question:   Preferred imaging location?    Answer:   [facility]   Flu vaccine trivalent PF, 6mos and older(Flulaval,Afluria,Fluarix,Fluzone)    Meds ordered this encounter  Medications  atorvastatin (LIPITOR) 20 MG tablet    Sig: Take 1 tablet (20 mg total) by mouth at bedtime.    Dispense:  90 tablet    Refill:  3   carvedilol (COREG) 25 MG tablet    Sig: Take 1 tablet (25 mg total) by mouth 2 (two) times daily with a meal.    Dispense:  180 tablet    Refill:  3     Follow up plan: Return in about 3 months (around 07/13/2023) for 3 month weight management.  Saralyn Pilar, DO [facility] 04/12/2023, 4:11 PM

## 2023-04-12 NOTE — Assessment & Plan Note (Signed)
Controlled Complication history CVA LLE Edema episodic   Plan:  1. Continue Amlodipine 10mg  2. Continue current BP regimen Carvedilol 25mg  BID, Hydralazine 75mg  (x 3 of 25mg  tab) TID, Lisinopril 20mg  daily 3. Encourage improved lifestyle - low sodium diet, regular exercise 4. Continue monitor BP outside office, bring readings to next visit, if persistently >140/90 or new symptoms notify office sooner

## 2023-04-12 NOTE — Assessment & Plan Note (Signed)
History of CVA 2017 Records reviewed He is on Statin therapy Has some sensation deficit L side chronic problem, left lower extremity foot affects his gait and balance at times but still very functional - discussed today, seems improved on Gabapentin 300mg  nightly less numbness. He is improving activity and muscle strength  Continues to have L Foot Drop  Encourage future consider work with PT or other options, ankle/foot drop brace  Follow w/ Neurology + PT

## 2023-04-12 NOTE — Patient Instructions (Addendum)
Thank you for coming to the office today.  Restart Atorvastatin 20mg  - keep try to take it regularly!  Zepbound TO Check Cost & Coverage of Zepbound Please contact Research officer, trade union (manufacturer for [employer]) 1-800-LillyRx [phone]) - Live agent to discuss cost and coverage.  Wegovy Injection  [email or web address]  Once you check into coverage, we can order one of them if you think it may be covered.  Chair = [employer]. Pension scheme manager chair.  For Weight Loss / Obesity only  Contrave - oral medication, appetite suppression has wellbutrin/bupropion and naltrexone in it and it can also help with appetite, it is ordered through a speciality pharmacy. - $99 per month  Free sample 7 day, 1 pill per day for 1 week  Alternative options  Semaglutide injection (mixed Ozempic) from [facility] 0.25mg  weekly for 4 weeks then increase to 0.5mg  weekly It comes in a vial and a needle syringe, you need to draw up the shot and self admin it weekly Cost is about $200 per month Call them to check pricing and availability  [facility] Address: [address], [place], [place] [address] Phone: [phone]   You have been referred for a Coronary Calcium Score Cardiac CT Scan. This is a screening test for patients aged 51-50+ with cardiovascular risk factors or who are healthy but would be interested in Cardiovascular Screening for heart disease. Even if there is a family history of heart disease, this imaging can be useful. Typically it can be done every 5+ years or at a different timeline we agree on  The scan will look at the chest and mainly focus on the heart and identify early signs of calcium build up or blockages within the heart arteries. It is not 100% accurate for identifying blockages or heart disease, but it is useful to help [place] predict who may have some early changes or be at  risk in the future for a heart attack or cardiovascular problem.  The results are reviewed by a Cardiologist and they will document the results. It should become available on MyChart. Typically the results are divided into percentiles based on other patients of the same demographic and age. So it will compare your risk to others similar to you. If you have a higher score >99 or higher percentile >75%tile, it is recommended to consider Statin cholesterol therapy and or referral to Cardiologist. I will try to help explain your results and if we have questions we can contact the Cardiologist.  You will be contacted for scheduling. Usually it is done at any imaging facility through [facility], [facility] or [facility].  The cost is $99 flat fee total and it does not go through insurance, so no authorization is required.     Please schedule a Follow-up Appointment to: Return in about 3 months (around 07/13/2023) for 3 month weight management.  If you have any other questions or concerns, please feel free to call the office or send a message through MyChart. You may also schedule an earlier appointment if necessary.  Additionally, you may be receiving a survey about your experience at our office within a few days to 1 week by e-mail or mail. We value your feedback.  Saralyn Pilar, DO [facility], [place]

## 2023-04-12 NOTE — Assessment & Plan Note (Addendum)
BMI >36 with comorbid conditions HYPERTENSION HLD History of CVA Improving lifestyle exercise Goal to improve diet Discussion on wt management medications given limited success >6 months with lifestyle management diet exercise Contact our office with requested Zepbound vs Wegovy if covered or approved - we can order and submit PA  Return 3 months for weight management if start med

## 2023-05-02 DIAGNOSIS — Z8673 Personal history of transient ischemic attack (TIA), and cerebral infarction without residual deficits: Secondary | ICD-10-CM | POA: Diagnosis not present

## 2023-05-02 DIAGNOSIS — M62838 Other muscle spasm: Secondary | ICD-10-CM | POA: Diagnosis not present

## 2023-05-02 DIAGNOSIS — G629 Polyneuropathy, unspecified: Secondary | ICD-10-CM | POA: Diagnosis not present

## 2023-05-02 DIAGNOSIS — I129 Hypertensive chronic kidney disease with stage 1 through stage 4 chronic kidney disease, or unspecified chronic kidney disease: Secondary | ICD-10-CM | POA: Diagnosis not present

## 2023-05-02 DIAGNOSIS — M1990 Unspecified osteoarthritis, unspecified site: Secondary | ICD-10-CM | POA: Diagnosis not present

## 2023-05-02 DIAGNOSIS — E785 Hyperlipidemia, unspecified: Secondary | ICD-10-CM | POA: Diagnosis not present

## 2023-05-02 DIAGNOSIS — N1831 Chronic kidney disease, stage 3a: Secondary | ICD-10-CM | POA: Diagnosis not present

## 2023-05-02 DIAGNOSIS — N529 Male erectile dysfunction, unspecified: Secondary | ICD-10-CM | POA: Diagnosis not present

## 2023-05-02 DIAGNOSIS — M5430 Sciatica, unspecified side: Secondary | ICD-10-CM | POA: Diagnosis not present

## 2023-05-02 DIAGNOSIS — R269 Unspecified abnormalities of gait and mobility: Secondary | ICD-10-CM | POA: Diagnosis not present

## 2023-05-02 DIAGNOSIS — Z6836 Body mass index (BMI) 36.0-36.9, adult: Secondary | ICD-10-CM | POA: Diagnosis not present

## 2023-12-03 DIAGNOSIS — Z87891 Personal history of nicotine dependence: Secondary | ICD-10-CM | POA: Diagnosis not present

## 2023-12-03 DIAGNOSIS — G629 Polyneuropathy, unspecified: Secondary | ICD-10-CM | POA: Diagnosis not present

## 2023-12-03 DIAGNOSIS — I129 Hypertensive chronic kidney disease with stage 1 through stage 4 chronic kidney disease, or unspecified chronic kidney disease: Secondary | ICD-10-CM | POA: Diagnosis not present

## 2023-12-03 DIAGNOSIS — I69354 Hemiplegia and hemiparesis following cerebral infarction affecting left non-dominant side: Secondary | ICD-10-CM | POA: Diagnosis not present

## 2023-12-03 DIAGNOSIS — R011 Cardiac murmur, unspecified: Secondary | ICD-10-CM | POA: Diagnosis not present

## 2023-12-03 DIAGNOSIS — Z8249 Family history of ischemic heart disease and other diseases of the circulatory system: Secondary | ICD-10-CM | POA: Diagnosis not present

## 2023-12-03 DIAGNOSIS — E785 Hyperlipidemia, unspecified: Secondary | ICD-10-CM | POA: Diagnosis not present

## 2023-12-03 DIAGNOSIS — N1831 Chronic kidney disease, stage 3a: Secondary | ICD-10-CM | POA: Diagnosis not present

## 2023-12-03 DIAGNOSIS — Z6837 Body mass index (BMI) 37.0-37.9, adult: Secondary | ICD-10-CM | POA: Diagnosis not present

## 2023-12-03 DIAGNOSIS — M1990 Unspecified osteoarthritis, unspecified site: Secondary | ICD-10-CM | POA: Diagnosis not present

## 2023-12-03 DIAGNOSIS — M5430 Sciatica, unspecified side: Secondary | ICD-10-CM | POA: Diagnosis not present

## 2024-01-01 ENCOUNTER — Other Ambulatory Visit: Payer: Self-pay | Admitting: Family Medicine

## 2024-01-01 DIAGNOSIS — N183 Hypertensive chronic kidney disease with stage 1 through stage 4 chronic kidney disease, or unspecified chronic kidney disease: Secondary | ICD-10-CM

## 2024-01-01 DIAGNOSIS — I129 Hypertensive chronic kidney disease with stage 1 through stage 4 chronic kidney disease, or unspecified chronic kidney disease: Secondary | ICD-10-CM

## 2024-01-03 ENCOUNTER — Other Ambulatory Visit: Payer: Self-pay | Admitting: Family Medicine

## 2024-01-03 DIAGNOSIS — N183 Hypertensive chronic kidney disease with stage 1 through stage 4 chronic kidney disease, or unspecified chronic kidney disease: Secondary | ICD-10-CM

## 2024-01-03 NOTE — Telephone Encounter (Signed)
 Copied from CRM #[id number]. Topic: Clinical - Medication Refill >> Jan 03, 2024  1:28 PM Avram G wrote: Medication: amLODipine  (NORVASC ) 5 MG tablet [[id number]], lisinopril  (ZESTRIL ) 20 MG tablet [[id number]],hydrALAZINE  (APRESOLINE ) 25 MG tablet  Has the patient contacted their pharmacy? Yes (Agent: If no, request that the patient contact the pharmacy for the refill. If patient does not wish to contact the pharmacy document the reason why and proceed with request.) (Agent: If yes, when and what did the pharmacy advise?)  This is the patient's preferred pharmacy:  [facility] - [place], [place] - [address] [facility] AT Portal to Registered Caremark Sites [address] [place] [address] Phone: [phone] Fax: [phone]  Is this the correct pharmacy for this prescription? Yes If no, delete pharmacy and type the correct one.   Has the prescription been filled recently? No  Is the patient out of the medication? Yes  Has the patient been seen for an appointment in the last year OR does the patient have an upcoming appointment? No  Can we respond through MyChart? Yes  Agent: Please be advised that Rx refills may take up to 3 business days. We ask that you follow-up with your pharmacy.

## 2024-01-04 NOTE — Telephone Encounter (Signed)
 OFFICE VISIT NEEDED FOR ADDITIONAL REFILLS   Requested Prescriptions  Pending Prescriptions Disp Refills   hydrALAZINE  (APRESOLINE ) 25 MG tablet [Pharmacy Med Name: HYDRALAZINE   TAB 25MG ] 810 tablet 0    Sig: TAKE 3 TABLETS (75MG  TOTAL)3 TIMES A DAY     Cardiovascular:  Vasodilators Failed - 01/04/2024  9:10 AM      Failed - HGB in normal range and within 360 days    Hemoglobin  Date Value Ref Range Status  04/05/2023 12.8 (L) 13.2 - 17.1 g/dL Final         Failed - ANA Screen, Ifa, Serum in normal range and within 360 days    No results found for: ANA, ANATITER, LABANTI       Failed - Valid encounter within last 12 months    Recent Outpatient Visits   None            Passed - HCT in normal range and within 360 days    HCT  Date Value Ref Range Status  04/05/2023 39.2 38.5 - 50.0 % Final         Passed - RBC in normal range and within 360 days    RBC  Date Value Ref Range Status  04/05/2023 4.51 4.20 - 5.80 Million/uL Final         Passed - WBC in normal range and within 360 days    WBC  Date Value Ref Range Status  04/05/2023 6.3 3.8 - 10.8 Thousand/uL Final         Passed - PLT in normal range and within 360 days    Platelets  Date Value Ref Range Status  04/05/2023 301 140 - 400 Thousand/uL Final         Passed - Last BP in normal range    BP Readings from Last 1 Encounters:  04/12/23 130/78          amLODipine  (NORVASC ) 5 MG tablet [Pharmacy Med Name: AMLODIPINE  TAB 5MG ] 30 tablet 0    Sig: TAKE 1 TABLET DAILY     Cardiovascular: Calcium  Channel Blockers 2 Failed - 01/04/2024  9:10 AM      Failed - Valid encounter within last 6 months    Recent Outpatient Visits   None            Passed - Last BP in normal range    BP Readings from Last 1 Encounters:  04/12/23 130/78         Passed - Last Heart Rate in normal range    Pulse Readings from Last 1 Encounters:  04/12/23 67          lisinopril  (ZESTRIL ) 20 MG tablet [Pharmacy Med  Name: LISINOPRIL  TAB 20MG ] 30 tablet 0    Sig: TAKE 1 TABLET DAILY     Cardiovascular:  ACE Inhibitors Failed - 01/04/2024  9:10 AM      Failed - Cr in normal range and within 180 days    Creat  Date Value Ref Range Status  04/05/2023 1.41 (H) 0.70 - 1.30 mg/dL Final         Failed - K in normal range and within 180 days    Potassium  Date Value Ref Range Status  04/05/2023 3.7 3.5 - 5.3 mmol/L Final         Failed - Valid encounter within last 6 months    Recent Outpatient Visits   None            Passed - Patient  is not pregnant      Passed - Last BP in normal range    BP Readings from Last 1 Encounters:  04/12/23 130/78

## 2024-01-05 NOTE — Telephone Encounter (Signed)
 Requested Prescriptions  Refused Prescriptions Disp Refills   amLODipine  (NORVASC ) 5 MG tablet 90 tablet 3    Sig: Take 1 tablet (5 mg total) by mouth daily.     Cardiovascular: Calcium  Channel Blockers 2 Failed - 01/05/2024  3:06 PM      Failed - Valid encounter within last 6 months    Recent Outpatient Visits   None            Passed - Last BP in normal range    BP Readings from Last 1 Encounters:  04/12/23 130/78         Passed - Last Heart Rate in normal range    Pulse Readings from Last 1 Encounters:  04/12/23 67          lisinopril  (ZESTRIL ) 20 MG tablet 90 tablet 3    Sig: Take 1 tablet (20 mg total) by mouth daily.     Cardiovascular:  ACE Inhibitors Failed - 01/05/2024  3:06 PM      Failed - Cr in normal range and within 180 days    Creat  Date Value Ref Range Status  04/05/2023 1.41 (H) 0.70 - 1.30 mg/dL Final         Failed - K in normal range and within 180 days    Potassium  Date Value Ref Range Status  04/05/2023 3.7 3.5 - 5.3 mmol/L Final         Failed - Valid encounter within last 6 months    Recent Outpatient Visits   None            Passed - Patient is not pregnant      Passed - Last BP in normal range    BP Readings from Last 1 Encounters:  04/12/23 130/78          hydrALAZINE  (APRESOLINE ) 25 MG tablet 810 tablet 3    Sig: Take 3 tablets (75 mg total) by mouth 3 (three) times daily.     Cardiovascular:  Vasodilators Failed - 01/05/2024  3:06 PM      Failed - HGB in normal range and within 360 days    Hemoglobin  Date Value Ref Range Status  04/05/2023 12.8 (L) 13.2 - 17.1 g/dL Final         Failed - ANA Screen, Ifa, Serum in normal range and within 360 days    No results found for: ANA, ANATITER, LABANTI       Failed - Valid encounter within last 12 months    Recent Outpatient Visits   None            Passed - HCT in normal range and within 360 days    HCT  Date Value Ref Range Status  04/05/2023 39.2 38.5 -  50.0 % Final         Passed - RBC in normal range and within 360 days    RBC  Date Value Ref Range Status  04/05/2023 4.51 4.20 - 5.80 Million/uL Final         Passed - WBC in normal range and within 360 days    WBC  Date Value Ref Range Status  04/05/2023 6.3 3.8 - 10.8 Thousand/uL Final         Passed - PLT in normal range and within 360 days    Platelets  Date Value Ref Range Status  04/05/2023 301 140 - 400 Thousand/uL Final         Passed - Last BP in  normal range    BP Readings from Last 1 Encounters:  04/12/23 130/78

## 2024-01-09 ENCOUNTER — Telehealth: Payer: Self-pay

## 2024-01-09 DIAGNOSIS — N183 Chronic kidney disease, stage 3 unspecified: Secondary | ICD-10-CM

## 2024-01-09 MED ORDER — AMLODIPINE BESYLATE 5 MG PO TABS: 5.0000 mg | ORAL_TABLET | Freq: Every day | ORAL | 1 refills | Status: DC

## 2024-01-09 MED ORDER — LISINOPRIL 20 MG PO TABS: 20.0000 mg | ORAL_TABLET | Freq: Every day | ORAL | 1 refills | Status: DC

## 2024-01-09 NOTE — Addendum Note (Signed)
 Addended by: ZELIA GAUZE D on: 01/09/2024 09:23 AM   Modules accepted: Orders

## 2024-01-09 NOTE — Telephone Encounter (Signed)
 Copied from CRM #[id number]. Topic: Clinical - Prescription Issue >> Jan 08, 2024  5:20 PM Deleta RAMAN wrote: Reason for CRM: [employer] pharmacy benefits is calling to notify patient medication was refused due to supply amount of 30 days instead of 90- days. Please contact kristin or general call back line at [phone]. Please contact patient as well

## 2024-03-21 IMAGING — DX DG SHOULDER 2+V*L*
3 series · 3 of 3 positions shown · non-contrast
Comparison: None Available.

CLINICAL DATA: Left shoulder pain after fall

EXAM:
LEFT SHOULDER - 2+ VIEW

[shoulder internal rotation ap]
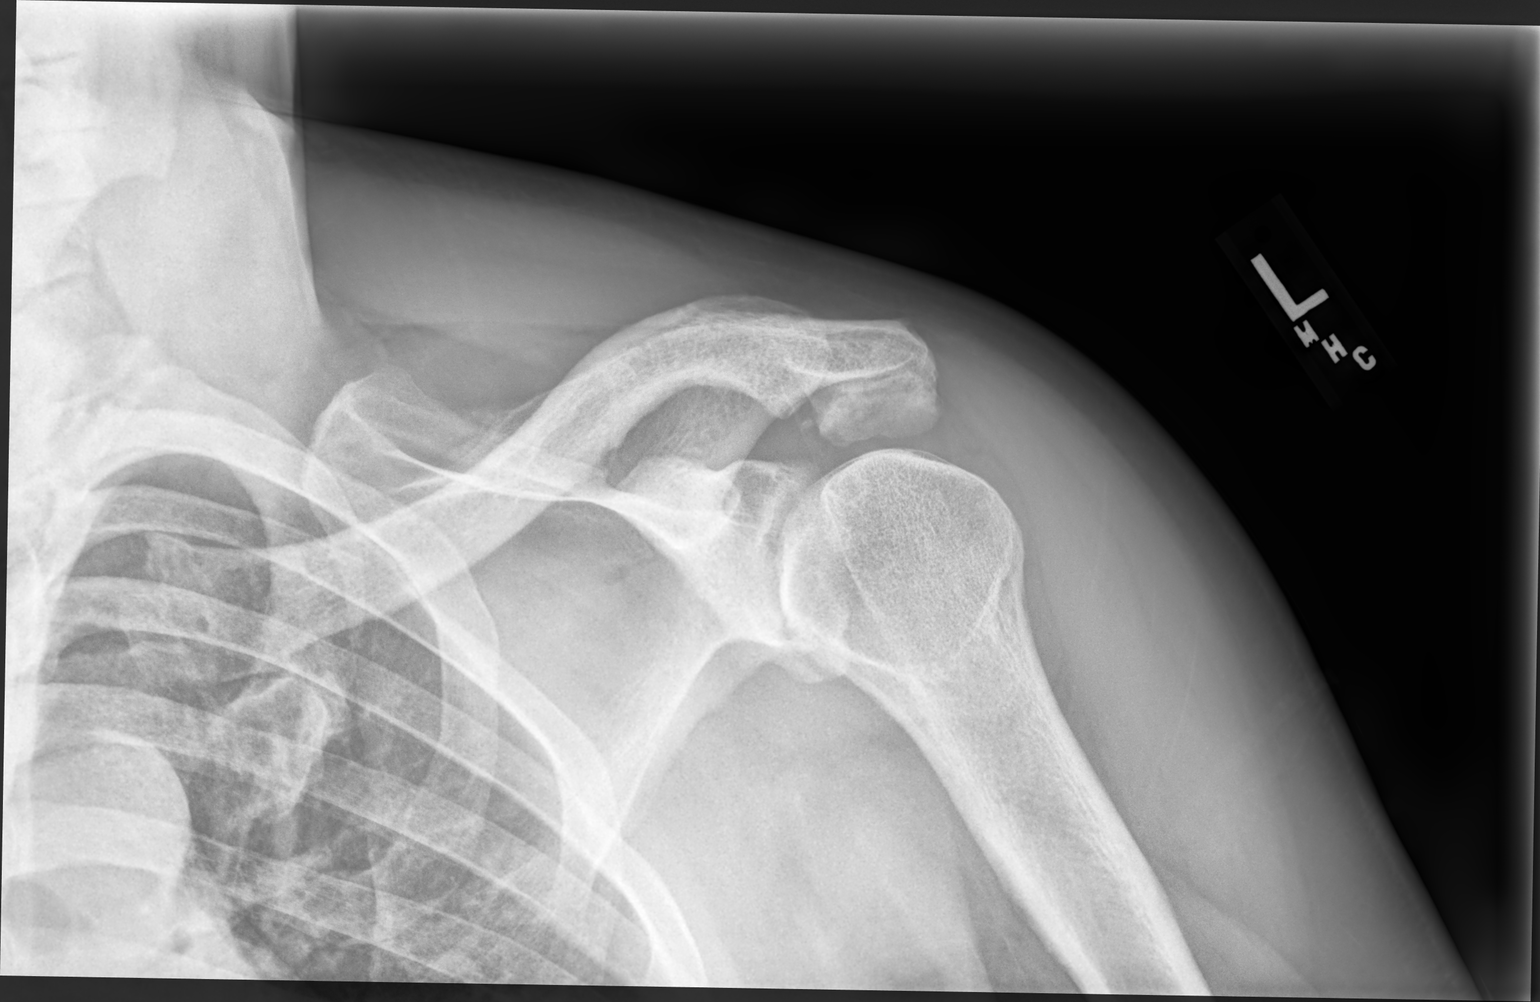

[shoulder (grashey view) ap]
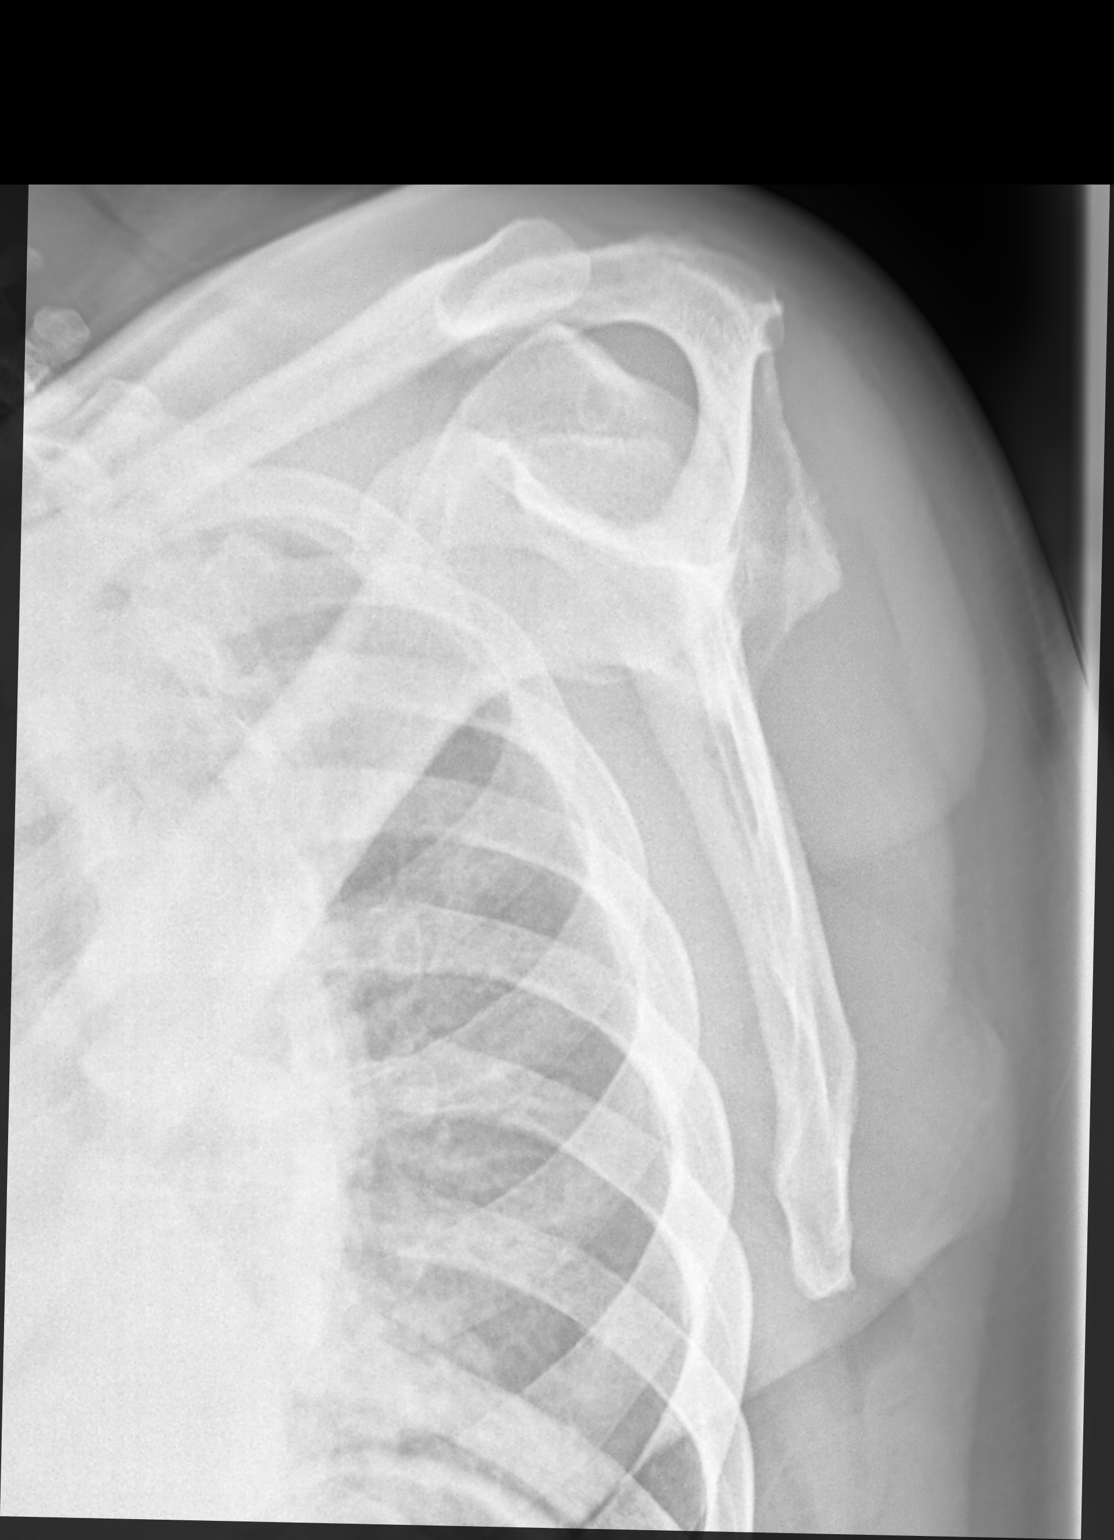

[shoulder (y view) lat]
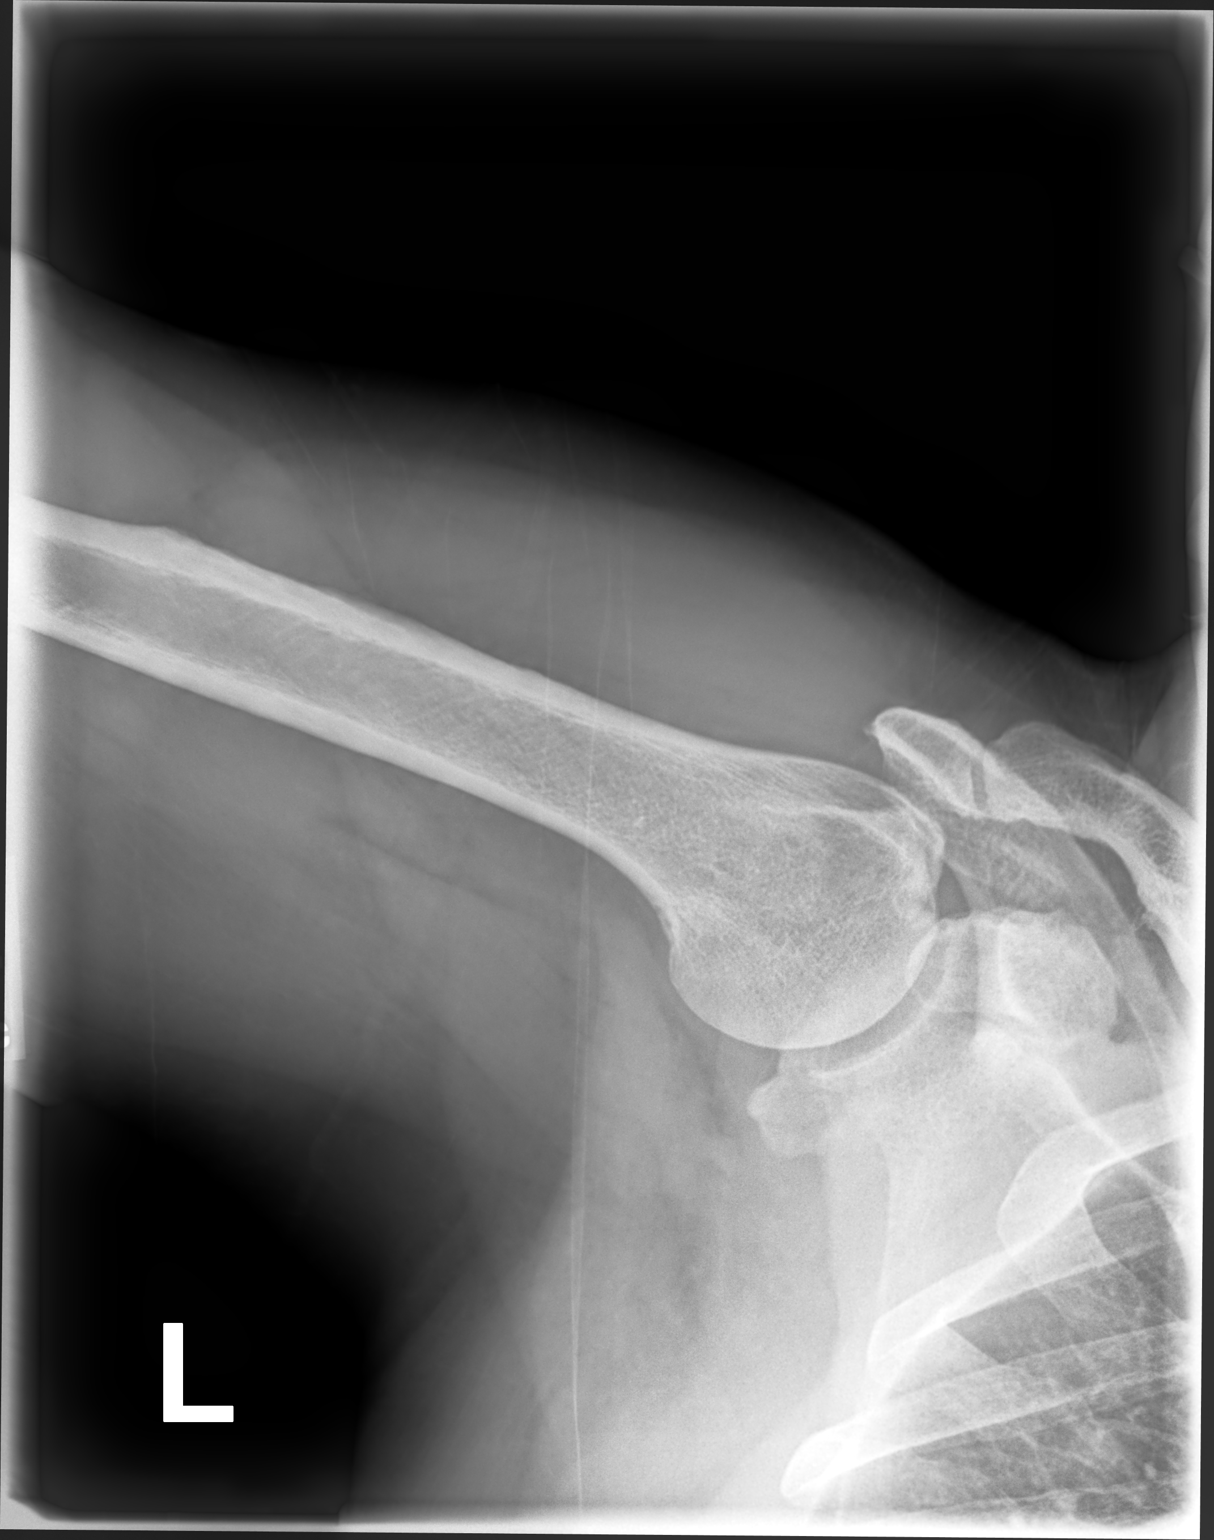

[3 of 3 positions shown; findings below may reference images not displayed]

FINDINGS: There is no evidence of fracture or dislocation. Moderate
arthropathy of the left glenohumeral and acromioclavicular joints.
Soft tissues are unremarkable.
IMPRESSION: 1. No fracture or dislocation of the left shoulder.
2. Moderate degenerative changes of the left shoulder.

## 2024-05-28 ENCOUNTER — Encounter: Payer: Self-pay | Admitting: Family Medicine

## 2024-05-28 DIAGNOSIS — I129 Hypertensive chronic kidney disease with stage 1 through stage 4 chronic kidney disease, or unspecified chronic kidney disease: Secondary | ICD-10-CM

## 2024-05-28 DIAGNOSIS — M5442 Lumbago with sciatica, left side: Secondary | ICD-10-CM

## 2024-05-28 DIAGNOSIS — E782 Mixed hyperlipidemia: Secondary | ICD-10-CM

## 2024-05-29 MED ORDER — AMLODIPINE BESYLATE 5 MG PO TABS: 5.0000 mg | ORAL_TABLET | Freq: Every day | ORAL | 1 refills | Status: AC

## 2024-05-29 MED ORDER — CARVEDILOL 25 MG PO TABS: 25.0000 mg | ORAL_TABLET | Freq: Two times a day (BID) | ORAL | 3 refills | Status: AC

## 2024-05-29 MED ORDER — CYCLOBENZAPRINE HCL 10 MG PO TABS: 10.0000 mg | ORAL_TABLET | Freq: Every evening | ORAL | 2 refills | Status: AC | PRN

## 2024-05-29 MED ORDER — LISINOPRIL 20 MG PO TABS: 20.0000 mg | ORAL_TABLET | Freq: Every day | ORAL | 1 refills | Status: AC

## 2024-05-29 MED ORDER — HYDRALAZINE HCL 25 MG PO TABS: 75.0000 mg | ORAL_TABLET | Freq: Three times a day (TID) | ORAL | 0 refills | Status: AC

## 2024-05-29 MED ORDER — ATORVASTATIN CALCIUM 20 MG PO TABS: 20.0000 mg | ORAL_TABLET | Freq: Every day | ORAL | 3 refills | Status: AC

## 2024-06-20 ENCOUNTER — Encounter: Payer: Self-pay | Admitting: Family Medicine

## 2024-06-28 ENCOUNTER — Encounter: Payer: Self-pay | Admitting: Family Medicine

## 2024-06-28 ENCOUNTER — Ambulatory Visit: Admitting: Family Medicine

## 2024-06-28 VITALS — BP 130/82 | HR 73 | Ht 75.0 in | Wt 265.1 lb

## 2024-06-28 DIAGNOSIS — G4733 Obstructive sleep apnea (adult) (pediatric): Secondary | ICD-10-CM

## 2024-06-28 DIAGNOSIS — Z23 Encounter for immunization: Secondary | ICD-10-CM

## 2024-06-28 NOTE — Progress Notes (Signed)
 "  Subjective:    Patient ID: Dalton Mathis, male    DOB: 06-23-1965, 59 y.o.   MRN: [id number]  Dalton Mathis is a 59 y.o. male presenting on 06/28/2024 for Medical Management of Chronic Issues (CPAP )   HPI  Discussed the use of AI scribe software for clinical note transcription with the patient, who gave verbal consent to proceed.  History of Present Illness   Dalton Mathis is a 59 year old male with a history of sleep apnea who presents for CPAP management.  Obstructive sleep apnea and cpap therapy - Diagnosed with obstructive sleep apnea at age [age] in [place]  - Has used CPAP therapy since that time. - Current CPAP machine in use for 15-16 years, possibly since 2010 - now non functional. Needs new order - Experiences light sleep and does not achieve deep sleep as previously - Falls asleep faster now compared to initial days of CPAP use - Occasionally feels refreshed upon waking, but often feels tired if sitting for prolonged periods at work - Feels tired when not using CPAP  Weight and body habitus - Current weight approximately 265 pounds, down from 300 pounds - Goal to lose 60 pounds by age 33  Epworth Sleepiness Scale Total Score: 12 Sitting and reading - 3 Watching TV - 3 Sitting inactive in a public place - 2 As a passenger in a car for an hour without a break - 2 Lying down to rest in the afternoon when circumstances permit - 2 Sitting and talking to someone - 0 Sitting quietly after a lunch without alcohol - 0 In a car, while stopped for a few minutes in traffic - 0      06/28/2024    2:58 PM 04/12/2023    3:49 PM 09/30/2022    4:27 PM  Depression screen PHQ 2/9  Decreased Interest 0 0 0  Down, Depressed, Hopeless 0 0 0  PHQ - 2 Score 0 0 0  Altered sleeping 1  0  Tired, decreased energy 1  0  Change in appetite 0  0  Feeling bad or failure about yourself  0  0  Trouble concentrating 0  0  Moving slowly or fidgety/restless 0  0   Suicidal thoughts 0  0  PHQ-9 Score 2  0   Difficult doing work/chores Not difficult at all  Not difficult at all     Data saved with a previous flowsheet row definition       06/28/2024    2:59 PM 04/12/2023    3:49 PM 09/30/2022    4:27 PM 04/08/2021    8:26 AM  GAD 7 : Generalized Anxiety Score  Nervous, Anxious, on Edge 0 0  0  0   Control/stop worrying 0 0  0  0   Worry too much - different things 0 0  0  0   Trouble relaxing 0 0  0  0   Restless 0 0  0  0   Easily annoyed or irritable 0 0  0  0   Afraid - awful might happen 0 0  0  0   Total GAD 7 Score 0 0 0 0  Anxiety Difficulty   Not difficult at all Not difficult at all     Data saved with a previous flowsheet row definition    Social History[1]  Review of Systems Per HPI unless specifically indicated above     Objective:    BP 130/82 (BP Location:  Left Arm, Patient Position: Sitting, Cuff Size: Large)   Pulse 73   Ht 6' 3 (1.905 m)   Wt 265 lb 2 oz (120.3 kg)   SpO2 96%   BMI 33.14 kg/m   Wt Readings from Last 3 Encounters:  06/28/24 265 lb 2 oz (120.3 kg)  04/12/23 290 lb 6.4 oz (131.7 kg)  10/25/22 289 lb (131.1 kg)    Physical Exam Vitals and nursing note reviewed.  Constitutional:      General: He is not in acute distress.    Appearance: Normal appearance. He is well-developed. He is obese. He is not diaphoretic.     Comments: Well-appearing, comfortable, cooperative  HENT:     Head: Normocephalic and atraumatic.  Eyes:     General:        Right eye: No discharge.        Left eye: No discharge.     Conjunctiva/sclera: Conjunctivae normal.  Cardiovascular:     Rate and Rhythm: Normal rate.  Pulmonary:     Effort: Pulmonary effort is normal.  Skin:    General: Skin is warm and dry.     Findings: No erythema or rash.  Neurological:     Mental Status: He is alert and oriented to person, place, and time.  Psychiatric:        Mood and Affect: Mood normal.        Behavior: Behavior  normal.        Thought Content: Thought content normal.     Comments: Well groomed, good eye contact, normal speech and thoughts     Results for orders placed or performed in visit on 04/05/23  TSH   Collection Time: 04/05/23  3:48 PM  Result Value Ref Range   TSH 1.48 0.40 - 4.50 mIU/L  PSA   Collection Time: 04/05/23  3:48 PM  Result Value Ref Range   PSA 0.47 < OR = 4.00 ng/mL  Lipid panel   Collection Time: 04/05/23  3:48 PM  Result Value Ref Range   Cholesterol 182 <200 mg/dL   HDL 39 (L) > OR = 40 mg/dL   Triglycerides 893 <849 mg/dL   LDL Cholesterol (Calc) 122 (H) mg/dL (calc)   Total CHOL/HDL Ratio 4.7 <5.0 (calc)   Non-HDL Cholesterol (Calc) 143 (H) <130 mg/dL (calc)  Hemoglobin J8r   Collection Time: 04/05/23  3:48 PM  Result Value Ref Range   Hgb A1c MFr Bld 5.4 <5.7 % of total Hgb   Mean Plasma Glucose 108 mg/dL   eAG (mmol/L) 6.0 mmol/L  CBC with Differential/Platelet   Collection Time: 04/05/23  3:48 PM  Result Value Ref Range   WBC 6.3 3.8 - 10.8 Thousand/uL   RBC 4.51 4.20 - 5.80 Million/uL   Hemoglobin 12.8 (L) 13.2 - 17.1 g/dL   HCT 60.7 61.4 - 49.9 %   MCV 86.9 80.0 - 100.0 fL   MCH 28.4 27.0 - 33.0 pg   MCHC 32.7 32.0 - 36.0 g/dL   RDW 85.8 88.9 - 84.9 %   Platelets 301 140 - 400 Thousand/uL   MPV 11.1 7.5 - 12.5 fL   Neutro Abs 2,986 1,500 - 7,800 cells/uL   Absolute Lymphocytes 2,054 850 - 3,900 cells/uL   Absolute Monocytes 806 200 - 950 cells/uL   Eosinophils Absolute 416 15 - 500 cells/uL   Basophils Absolute 38 0 - 200 cells/uL   Neutrophils Relative % 47.4 %   Total Lymphocyte 32.6 %   Monocytes Relative 12.8 %  Eosinophils Relative 6.6 %   Basophils Relative 0.6 %  COMPLETE METABOLIC PANEL WITH GFR   Collection Time: 04/05/23  3:48 PM  Result Value Ref Range   Glucose, Bld 93 65 - 99 mg/dL   BUN 10 7 - 25 mg/dL   Creat 8.58 (H) 9.29 - 1.30 mg/dL   eGFR 58 (L) > OR = 60 mL/min/1.20m2   BUN/Creatinine Ratio 7 6 - 22 (calc)    Sodium 140 135 - 146 mmol/L   Potassium 3.7 3.5 - 5.3 mmol/L   Chloride 104 98 - 110 mmol/L   CO2 28 20 - 32 mmol/L   Calcium  9.4 8.6 - 10.3 mg/dL   Total Protein 7.8 6.1 - 8.1 g/dL   Albumin 4.3 3.6 - 5.1 g/dL   Globulin 3.5 1.9 - 3.7 g/dL (calc)   AG Ratio 1.2 1.0 - 2.5 (calc)   Total Bilirubin 1.1 0.2 - 1.2 mg/dL   Alkaline phosphatase (APISO) 76 35 - 144 U/L   AST 33 10 - 35 U/L   ALT 25 9 - 46 U/L      Assessment & Plan:   Problem List Items Addressed This Visit   None Visit Diagnoses       OSA on CPAP    -  Primary   Relevant Orders   Home sleep test     Flu vaccine need       Relevant Orders   Flu vaccine trivalent PF, 6mos and older(Flulaval,Afluria,Fluarix,Fluzone) (Completed)        Obstructive sleep apnea on CPAP Long-standing obstructive sleep apnea managed with CPAP therapy. Current machine outdated, requires replacement. Consistent CPAP use with improved sleep quality and reduced fatigue. No significant adherence issues, except for rare occasions when the machine was unavailable or in extreme situations. - Ordered home sleep study through [facility]. - Instructed him to follow prompts for home sleep study equipment and return it after use. - Will review home sleep study results once received. - Will determine insurance network for CPAP supply company based on coverage. - [employer], [employer] vs [employer] - Schedule follow-up in three months to assess CPAP usage and insurance approval.  Flu Shot today  Refer to Home Sleep Study - [facility]  Orders Placed This Encounter  Procedures   Flu vaccine trivalent PF, 6mos and older(Flulaval,Afluria,Fluarix,Fluzone)   Home sleep test    [facility] Home Sleep Study, existing OSA and previous CPAP. Past machine non functional, needs new sleep study and orders for CPAP.    Where should this test be performed::   Other    No orders of the defined types were placed in this encounter.   Follow up  plan: Return in about 3 months (around 09/25/2024) for 3 month CPAP Compliance.  Marsa Officer, DO [facility] 06/28/2024, 3:12 PM     [1]  Social History Tobacco Use   Smoking status: Never   Smokeless tobacco: Never  Substance Use Topics   Alcohol use: Yes    Alcohol/week: 2.0 standard drinks of alcohol    Types: 2 Cans of beer per week   Drug use: No   "

## 2024-06-28 NOTE — Patient Instructions (Addendum)
 Thank you for coming to the office today.  [facility] - home sleep study has been ordered. This company is based out of [place]  and they will ship the equipment to you. You can check coverage and cost. My understanding is that the out of pocket maximum if not covered is $250 approximately  [email or web address]   Stay tuned, follow the prompts, wear the equipment, and sleep as directed 1-2 days or follow their instructions, then send it back to [employer] [facility].  This process may take 2+ weeks to send the order and receive the equipment. Then 2+ weeks for you send it back and receive results.  Once results come in we can review them and contact you on mychart or by phone and discuss next steps and ordering CPAP equipment.  Please check into your insurance / cost coverage network to determine which company is in network so we can order the CPAP when ready  [facility] Address: [address], [place], [place] [address] Phone: [phone] Fax: [phone]  [facility] Medical equipment supplier in [place], [place]  Address: [address]LENA ODESSIA NOVAK [place], [place] [address] Phone: [phone]  [facility] Address: [address], [place], [place] [address] Phone: [phone]   Please schedule a Follow-up Appointment to: Return in about 3 months (around 09/25/2024) for 3 month CPAP Compliance.  If you have any other questions or concerns, please feel free to call the office or send a message through MyChart. You may also schedule an earlier appointment if necessary.  Additionally, you may be receiving a survey about your experience at our office within a few days to 1 week by e-mail or mail. We value your feedback.  Marsa Officer, DO [facility], [place]

## 2024-10-11 ENCOUNTER — Ambulatory Visit: Admitting: Family Medicine
